# Patient Record
Sex: Female | Born: 1945
Health system: Southern US, Community
[De-identification: ages and names within clinical notes are randomized; demographics above are authoritative.]

## PROBLEM LIST (undated history)

## (undated) DIAGNOSIS — F32A Depression, unspecified: Secondary | ICD-10-CM

## (undated) DIAGNOSIS — M5030 Other cervical disc degeneration, unspecified cervical region: Secondary | ICD-10-CM

## (undated) DIAGNOSIS — F419 Anxiety disorder, unspecified: Secondary | ICD-10-CM

## (undated) DIAGNOSIS — K922 Gastrointestinal hemorrhage, unspecified: Secondary | ICD-10-CM

## (undated) DIAGNOSIS — Z789 Other specified health status: Secondary | ICD-10-CM

## (undated) DIAGNOSIS — I350 Nonrheumatic aortic (valve) stenosis: Secondary | ICD-10-CM

## (undated) DIAGNOSIS — K219 Gastro-esophageal reflux disease without esophagitis: Secondary | ICD-10-CM

## (undated) DIAGNOSIS — E669 Obesity, unspecified: Secondary | ICD-10-CM

## (undated) DIAGNOSIS — F329 Major depressive disorder, single episode, unspecified: Secondary | ICD-10-CM

## (undated) DIAGNOSIS — M797 Fibromyalgia: Secondary | ICD-10-CM

## (undated) DIAGNOSIS — I10 Essential (primary) hypertension: Secondary | ICD-10-CM

## (undated) DIAGNOSIS — I2510 Atherosclerotic heart disease of native coronary artery without angina pectoris: Secondary | ICD-10-CM

## (undated) DIAGNOSIS — E785 Hyperlipidemia, unspecified: Secondary | ICD-10-CM

## (undated) DIAGNOSIS — J329 Chronic sinusitis, unspecified: Secondary | ICD-10-CM

## (undated) DIAGNOSIS — D649 Anemia, unspecified: Secondary | ICD-10-CM

## (undated) DIAGNOSIS — I219 Acute myocardial infarction, unspecified: Secondary | ICD-10-CM

## (undated) DIAGNOSIS — Z780 Asymptomatic menopausal state: Secondary | ICD-10-CM

## (undated) DIAGNOSIS — M1990 Unspecified osteoarthritis, unspecified site: Secondary | ICD-10-CM

## (undated) DIAGNOSIS — M8580 Other specified disorders of bone density and structure, unspecified site: Secondary | ICD-10-CM

## (undated) DIAGNOSIS — M419 Scoliosis, unspecified: Secondary | ICD-10-CM

## (undated) HISTORY — DX: Other specified disorders of bone density and structure, unspecified site: M85.80

## (undated) HISTORY — DX: Other specified health status: Z78.9

## (undated) HISTORY — DX: Unspecified osteoarthritis, unspecified site: M19.90

## (undated) HISTORY — DX: Other cervical disc degeneration, unspecified cervical region: M50.30

## (undated) HISTORY — DX: Asymptomatic menopausal state: Z78.0

## (undated) HISTORY — DX: Chronic sinusitis, unspecified: J32.9

## (undated) HISTORY — DX: Hyperlipidemia, unspecified: E78.5

## (undated) HISTORY — PX: COLONOSCOPY: SHX174

## (undated) HISTORY — PX: CATARACT EXTRACTION: SUR2

## (undated) HISTORY — PX: KNEE ARTHROSCOPY: SUR90

## (undated) HISTORY — DX: Nonrheumatic aortic (valve) stenosis: I35.0

## (undated) HISTORY — PX: CARDIAC CATHETERIZATION: SHX172

## (undated) HISTORY — DX: Scoliosis, unspecified: M41.9

---

## 1971-12-19 HISTORY — PX: TOTAL VAGINAL HYSTERECTOMY: SHX2548

## 1991-12-19 HISTORY — PX: LAPAROSCOPIC CHOLECYSTECTOMY: SUR755

## 2001-07-16 ENCOUNTER — Encounter: Payer: Self-pay | Admitting: Internal Medicine

## 2001-07-16 ENCOUNTER — Ambulatory Visit (HOSPITAL_COMMUNITY): Admission: RE | Admit: 2001-07-16 | Discharge: 2001-07-16 | Payer: Self-pay | Admitting: Internal Medicine

## 2002-05-27 ENCOUNTER — Other Ambulatory Visit: Admission: RE | Admit: 2002-05-27 | Discharge: 2002-05-27 | Payer: Self-pay | Admitting: Obstetrics and Gynecology

## 2002-08-07 ENCOUNTER — Encounter: Payer: Self-pay | Admitting: Emergency Medicine

## 2002-08-07 ENCOUNTER — Emergency Department (HOSPITAL_COMMUNITY): Admission: EM | Admit: 2002-08-07 | Discharge: 2002-08-07 | Payer: Self-pay | Admitting: Emergency Medicine

## 2002-08-14 ENCOUNTER — Ambulatory Visit (HOSPITAL_COMMUNITY): Admission: RE | Admit: 2002-08-14 | Discharge: 2002-08-14 | Payer: Self-pay | Admitting: Cardiology

## 2002-08-14 ENCOUNTER — Encounter: Payer: Self-pay | Admitting: Cardiology

## 2002-11-25 ENCOUNTER — Encounter: Admission: RE | Admit: 2002-11-25 | Discharge: 2003-02-23 | Payer: Self-pay | Admitting: Internal Medicine

## 2003-03-24 ENCOUNTER — Encounter: Admission: RE | Admit: 2003-03-24 | Discharge: 2003-06-22 | Payer: Self-pay | Admitting: Internal Medicine

## 2003-05-31 ENCOUNTER — Encounter: Payer: Self-pay | Admitting: Internal Medicine

## 2003-05-31 ENCOUNTER — Ambulatory Visit (HOSPITAL_COMMUNITY): Admission: RE | Admit: 2003-05-31 | Discharge: 2003-05-31 | Payer: Self-pay | Admitting: Internal Medicine

## 2004-01-19 HISTORY — PX: GASTRIC BYPASS: SHX52

## 2004-11-16 ENCOUNTER — Inpatient Hospital Stay (HOSPITAL_COMMUNITY): Admission: EM | Admit: 2004-11-16 | Discharge: 2004-11-19 | Payer: Self-pay | Admitting: Emergency Medicine

## 2004-11-17 DIAGNOSIS — I219 Acute myocardial infarction, unspecified: Secondary | ICD-10-CM

## 2004-11-17 HISTORY — DX: Acute myocardial infarction, unspecified: I21.9

## 2005-10-03 ENCOUNTER — Inpatient Hospital Stay (HOSPITAL_COMMUNITY): Admission: EM | Admit: 2005-10-03 | Discharge: 2005-10-05 | Payer: Self-pay | Admitting: Emergency Medicine

## 2007-05-10 ENCOUNTER — Encounter: Admission: RE | Admit: 2007-05-10 | Discharge: 2007-05-10 | Payer: Self-pay | Admitting: Internal Medicine

## 2008-01-23 ENCOUNTER — Emergency Department (HOSPITAL_COMMUNITY): Admission: EM | Admit: 2008-01-23 | Discharge: 2008-01-23 | Payer: Self-pay | Admitting: Emergency Medicine

## 2008-09-15 ENCOUNTER — Encounter (HOSPITAL_COMMUNITY): Admission: RE | Admit: 2008-09-15 | Discharge: 2008-12-14 | Payer: Self-pay | Admitting: Internal Medicine

## 2009-02-23 ENCOUNTER — Ambulatory Visit (HOSPITAL_COMMUNITY): Admission: RE | Admit: 2009-02-23 | Discharge: 2009-02-23 | Payer: Self-pay | Admitting: Internal Medicine

## 2010-04-17 LAB — HM DEXA SCAN

## 2011-05-05 NOTE — Discharge Summary (Signed)
[name], Stone              ACCOUNT NO.:  [id number]   MEDICAL RECORD NO.:  [id number]          PATIENT TYPE:  INP   LOCATION:  3029                         FACILITY:  [facility]   PHYSICIAN:  Mark A. Perini, M.D.   DATE OF BIRTH:  1946/05/08   DATE OF ADMISSION:  10/03/2005  DATE OF DISCHARGE:  10/05/2005                                 DISCHARGE SUMMARY   DISCHARGE DIAGNOSES:  1.  Acute upper gastrointestinal bleeding due to an anastomotic ulcer at the      site of the previous gastrojejunal junction, status post gastric bypass      surgery.  2.  Anemia of acute blood loss, status post transfusion of 2 units packed      red cells this admission.  3.  Atherosclerotic coronary disease, stable at this time.  4.  Hypertension stable at this time.   PROCEDURE:  Esophagogastroduodenoscopy which showed a tubular stomach status  post apparent banded gastroplasty with a two limbed gastrojejunostomy.  There was a nonbleeding 1.5 cm ulcer at the limbs at the anastomosis.  This  procedure was performed on October 03, 2005.   DISCHARGE MEDICATIONS:  She is to stop Celebrex and use no NSAIDs ever  again. She is to stop Plavix until Dr. Luther Parody clears her to do so and she  is told that she can resume this in approximately one week. She is to  continue all other previous medications. She is to take Protonix 40 mg twice  daily with food. She is to continue to use Tylenol as needed. She may resume  her previous Effexor XR 150 mg daily, Wellbutrin XL 150 mg daily, Zetia 10  mg daily, Lipitor 40 mg daily all as before. She is to hold her Vasotec for  one week before resuming this.   HISTORY OF PRESENT ILLNESS:  Kimberly Stone is a pleasant 65 year old female who  underwent mini-gastric bypass surgery in February of 2005 for obesity. She  has responded very well to this. However, she developed at 6:00 p.m. the day  prior to admission, some stomach upset and went to bed for few hours and  when she woke  up at 11:00 p.m. she vomited some nonbloody material. She then  had a syncopal type episode and had maroon, very dark, bloody bowel  movement. She was back to bed and this morning early in the morning had  another dark bloody, maroon stool. She called our office and was advised to  present to the emergency room.  In the emergency room, she was somewhat  tachycardiac and hypotensive and her hemoglobin was 9.9, which is a drop  from a baseline of 13.7 which was just obtained in September of 2006. She  was admitted for acute upper GI bleed.   HOSPITAL COURSE:  Kimberly Stone was admitted to a transitional care level bed. She  was given 2 units of packed red cells and responded to this well. She did  not become more hypotensive. GI was consulted and performed an EGD  procedure. She was found a 1.5 cm ulcer at her anastomosis. She was treated  with  IV Protonix and she had no evidence of recurrent bleeding. On October 05, 2005, she was deemed stable for discharge home.   DISCHARGE PHYSICAL EXAM:  Temperature 97.4, the patient was afebrile, pulse  69, respiratory 20, blood pressure 115/60, 97% saturation on room air. She  is in no acute distress. Lungs were clear to auscultation bilaterally with  no wheezes, rales or rhonchi. Heart was regular rate and rhythm with no  murmur, rub or gallop. Abdomen was soft and nontender. There was no edema.  She is neurologically intact.   LABORATORY DATA ON DISCHARGE:  White count 5.9, hemoglobin 10.9, platelet  count 178,000.  Other laboratory data on admission:  Coagulation studies  were normal.  Her albumin was somewhat low at 3.2 but other liver function  tests were normal on admission.   DISCHARGE INSTRUCTIONS:  Kimberly Stone is to follow a low salt diet and is to avoid  foods that can cause reflux or gastric irritation such as spicy or overly  greasy foods. She is to follow-up in two weeks with Dr. Waynard Edwards.  She is to  follow-up with Dr. Luther Parody as well. She is to  call if she has any  recurrent problems.           ______________________________  Redge Gainer Waynard Edwards, M.D.     MAP/MEDQ  D:  10/10/2005  T:  10/10/2005  Job:  [id number]   cc:   Althea Grimmer. Luther Parody, M.D.  Fax: [phone]   Thereasa Solo. Little, M.D.  Fax: [phone]

## 2011-05-05 NOTE — Cardiovascular Report (Signed)
Kimberly Stone, Kimberly Stone              ACCOUNT NO.:  [id number]   MEDICAL RECORD NO.:  [id number]          PATIENT TYPE:  INP   LOCATION:  1826                         FACILITY:  [facility]   PHYSICIAN:  Thereasa Solo. Little, M.D. DATE OF BIRTH:  [birth year]   DATE OF PROCEDURE:  11/16/2004  DATE OF DISCHARGE:                              CARDIAC CATHETERIZATION   INDICATIONS:  This 65 year old female presented to the emergency room with  about a one and a half hour duration of severe left arm pain with some  radiation down the left arm.  No associated diaphoresis or shortness of  breath.  It resolved in the emergency room without significant treatment.  She is completely pain-free.  Her EKG is normal; however, her troponin is  increased at 0.36.  Because of this, she is brought to the catheter lab.  She has been given IV heparin in the emergency room and IV nitroglycerin.   DESCRIPTION OF PROCEDURE:  After obtaining informed consent, the patient was  prepped and draped in the usual sterile fashion exposing the right groin.  Applying local anesthetic with 1%, the Seldinger technique was employed and  a 5 [place] introducer sheath was placed into the right femoral artery.  Left  and right coronary arteriography and ventriculography in the RAO projection  and LAO projection was performed.   Intracoronary nitroglycerin to the left system was also given.   RESULTS:  1.  Left main normal.  2.  LAD:  The LAD extended down towards the apex and bifurcated into what      looked almost like a twin LAD system with a very large diagonal branch      and a very large ongoing LAD.  This entire system was free of disease.  3.  Circumflex:  The circumflex gave rise to a large OM vessel that was free      of disease.  4.  Optional diagonal:  The optional diagonal was a small vessel about 1.5      mm in diameter.  It extended down the lateral wall.  It was diffusely      diseased throughout.  In the proximal  area, it was about 70% narrowed      with diffuse 70-80% areas of narrowing throughout.  5.  Right coronary artery:  This was a very large vessel about 4.5 mm in      diameter with a large PDA and posterolateral branch, all of which were      free of disease.   VENTRICULOGRAPHY:  Ventriculography in both the RAO and LAO projection  revealed an inferior lateral segmental wall motion abnormality of severe  hypokinesis. The ejection fraction was still 50% and the end-diastolic  pressure was 9.   HEMODYNAMIC DATA:  1.  Central aortic pressure was 152/91.  2.  Left ventricular pressure was 156/5 and there was no aortic valve      gradient noted at the time of pullback.   CONCLUSION:  Severely diseased small optional diagonal vessel responsible  for wall motion abnormalities in the inferior lateral wall.  The vessel is  diameter wise to small and diffusely diseased for consideration of  intervention.  I  plan to place her on metoprolol 25 mg q.d., Plavix, Imdur, and aspirin.  She  should do well from a medical standpoint.  I would keep her in the hospital  approximately 48 hours since this is clearly a small myocardial infarction.  Of note, she had TIMI II flow precatheterization and postcatheterization.       ABL/MEDQ  D:  11/16/2004  T:  11/16/2004  Job:  [id number]   cc:   Loraine Leriche A. Waynard Edwards, M.D.  [address]  [place]  [place] [address]  Fax: [phone]

## 2011-05-05 NOTE — Consult Note (Signed)
NAMEDORETTA, REMMERT              ACCOUNT NO.:  [id number]   MEDICAL RECORD NO.:  [id number]          PATIENT TYPE:  EMS   LOCATION:  MAJO                         FACILITY:  [facility]   PHYSICIAN:  Althea Grimmer. Santogade, M.D.DATE OF BIRTH:  Jan 22, 1946   DATE OF CONSULTATION:  10/03/2005  DATE OF DISCHARGE:                                   CONSULTATION   Ms. Sposito is a 65 year old female who presents to the emergency room after  having maroon stool last night and this morning accompanied by syncope last  p.m. She has no prior history of gastrointestinal bleeding or ulcer disease.  She is never had an upper endoscopy. However, she had a colonoscopy in March  2004 that showed melanosis but no other problems. She is status post a mini  gastric bypass in February 2005 for obesity and pre-diabetes. She has not  had any major problems from this procedure until this date. She has a  history of subendocardial MI in November 2005. Initially she was treated  with aspirin and Plavix but currently is only on Plavix. She does admit to  occasional Celebrex but no other nonsteroidals. She denies abdominal pain or  chest discomfort. In the emergency room, her hemoglobin was 9.9 with a  baseline of 13.7, platelet count, and prothrombin time are normal. Her BUN  is 34 with a creatinine of 0.8.   PAST MEDICAL HISTORY:  1.  Fibromyalgia.  2.  Prior obesity (she has lost 90 pounds since her bypass).  3.  Hypertension.  4.  Hyperlipidemia.  5.  Constipation, resolved after bypass surgery.  6.  Coronary artery disease.  7.  Degenerative joint disease.  8.  Metabolic syndrome.  9.  Status post subendocardial MI.   CURRENT MEDICATIONS:  1.  Plavix 75 mg daily.  2.  Effexor XR 100 mg daily.  3.  Multivitamins with iron or.  4.  Ursodiol 300 mg twice daily.  5.  Zetia 10 mg daily.  6.  Wellbutrin XL 150 mg daily.  7.  Lipitor 40 mg daily.  8.  Vasotec 10 mg daily.   FAMILY HISTORY:  Is notable liver  cancer.   SOCIAL HISTORY:  Married, nonsmoker, minimal alcohol.   REVIEW OF SYSTEMS:  As noted in above problems.   PHYSICAL EXAMINATION:  GENERAL: She is a well-developed, well-nourished  adult female in no acute distress.  VITAL SIGNS:  Afebrile. Blood pressure 107/74, pulse 85.  SKIN: Normal.  HEENT: Eyes anicteric, conjunctiva mildly pale. Oropharynx unremarkable.  NECK:  Supple without thyromegaly or adenopathy.  CHEST:  Sounds clear.  HEART:  Sounds regular rate and rhythm.  ABDOMEN:  Benign without mass, tenderness or organomegaly.  RECTAL:  Exam is not repeated, but showed maroon stool according to the  emergency room physician.  EXTREMITIES:  Without cyanosis, clubbing, edema or rash.   IMPRESSION:  Acute upper gastrointestinal bleed, likely an anastomotic  ulcer.   PLAN:  1.  Hydrate.  2.  Transfuse as needed.  3.  Protonix IV.  4.  Upper endoscopy is suggested to the patient and reviewed in terms of  technique, preparation and risk of complications including bleeding and      perforation. It will be performed within the next 1-2 hours with further      recommendations to follow that procedure. It should be noted that the      distal stomach is inaccessible to endoscopic studies if we do not find      an answer on the initial upper endoscopy.      Althea Grimmer. Luther Parody, M.D.  Electronically Signed     PJS/MEDQ  D:  10/03/2005  T:  10/03/2005  Job:  [id number]   cc:   Loraine Leriche A. Perini, M.D.  Fax: [phone]   Llana Aliment. Malon Kindle., M.D.  Fax: [phone]   Thereasa Solo. Little, M.D.  Fax: [phone]

## 2011-05-05 NOTE — Discharge Summary (Signed)
NAMEJOEL, Kimberly Stone              ACCOUNT NO.:  [id number]   MEDICAL RECORD NO.:  [id number]          PATIENT TYPE:  INP   LOCATION:                               FACILITY:  [facility]   PHYSICIAN:  Thereasa Solo. Little, M.D. DATE OF BIRTH:  [birth year]   DATE OF ADMISSION:  11/16/2004  DATE OF DISCHARGE:  11/19/2004                                 DISCHARGE SUMMARY   DISCHARGE DIAGNOSES:  1.  Subendocardial myocardial infarction this admission secondary to an      optional diagonal lesion, plan is for medical therapy.  2.  Good left      ventricular function with an ejection fraction of 50%.  3.      Hyperlipidemia with an low-density lipoprotein of 120.  4. History of      depression.  5. History of gastric bypass surgery in 01/2003 with a      subsequent 80 pound weight loss.   HOSPITAL COURSE:  The patient is a 65 year old female seen by Dr. Clarene Duke in  the past who had a Cardiolite study a couple of years ago that was negative.  She was in her kitchen when she had a sudden onset of left chest pain.  She  presented to the emergency room.  Her symptoms were associated with some  nausea.  She was seen by Dr. Clarene Duke.  Initial enzymes were positive.  She  was taken to the cath lab by Dr. Clarene Duke.  Catheterization revealed patent  LAD, patent circumflex, diffusely diseased OM and a patent RCA.  An LV  function was normal with an EF of 50% although she did have some inferior  and lateral wall hypokinesis.  Dr. Clarene Duke felt her lesion was too small for  percutaneous intervention and she has been treated medically.  She did well.  Her CKs peaked at 374 with 65 MBs.  She was transferred to the floor and  ambulated.  We feel she can be discharged 11/19/04.   DISCHARGE MEDICATIONS:  1.  Coated aspirin once a day.  2. Prilosec over-the-counter once a day.  3.      Plavix 75 mg a day.  4. Toprol-XL 25 mg a day.  5. Zocor 40 mg a day.      6. Aldactone 12.5 mg a day.  7. Imdur 30 mg a day.  8.  Nitroglycerin      sublingual p.r.n.  9. Effexor as taken at home.  10. Wellbutrin as taken      at home.   LABORATORY:  Sodium 137, potassium 3.9, BUN 10, creatinine 0.8.  White count  6.7, hemoglobin 12.4, hematocrit 36.2, platelets 242,000.  INR 1.0.  Liver  functions are normal.  AST is 27.  ALT 19.  Lipid profile shows a total  cholesterol of 197, HDL 50, LDL 120, triglycerides 133.  EKG reveals sinus  rhythm with no acute changes.  Portable chest x-ray revealed no active  disease.   DISPOSITION:  The patient is discharged in stable condition and will follow  up with Dr. Clarene Duke.       ________________________________________  Franky Macho  Leonor Liv, P.A.  ___________________________________________  Thereasa Solo Little, M.D.    Lenard Lance  D:  11/19/2004  T:  11/20/2004  Job:  [id number]

## 2011-05-05 NOTE — H&P (Signed)
Kimberly Stone, Kimberly Stone              ACCOUNT NO.:  [id number]   MEDICAL RECORD NO.:  [id number]          PATIENT TYPE:  EMS   LOCATION:  MAJO                         FACILITY:  [facility]   PHYSICIAN:  Mark A. Perini, M.D.   DATE OF BIRTH:  February 15, 1946   DATE OF ADMISSION:  10/03/2005  DATE OF DISCHARGE:                                HISTORY & PHYSICAL   CHIEF COMPLAINT:  Presyncope/syncope, and maroon stool.   HISTORY OF PRESENT ILLNESS:  Kimberly Stone is a pleasant 65 year old female with  past medical history as listed below.  She was in her usual state of health  until approximately 6 p.m. yesterday, when she developed some stomach upset.  She went to bed and slept for a few hours.  She woke up at 11 p.m. and did  vomit somewhat.  There was no blood in her vomit and very little emesis was  produced.  She then had a presyncopal or close to syncopal episode and had a  maroon, very dark, bloody bowel movement.  She went back to bed and this  morning had another episode of very dark, bloody stool.  She called our  office and was advised to come to the emergency room.  In the emergency room  she is felt to have somewhat elevated pulse, close to 100 beats per minute.  Her blood pressure is somewhat low for her at 100/70 and her hemoglobin is  down to 9.9.  Her baseline hemoglobin is 13.7 from September 2006.  She will  be admitted for an acute upper GI bleed.   PAST MEDICAL HISTORY:  1.  Fibromyalgia.  2.  Obesity.  3.  Hypertension since 1980.  4.  Hyperlipidemia.  5.  Depression.  6.  Status post cholecystectomy in 1980.  7.  Vaginal hysterectomy without oophorectomy.  8.  Cervical radiculopathy in September 2001.  9.  Gastroesophageal reflux disease.  10. DJD and DDD of the cervical spine.  11. Dysmetabolic syndrome X.  12. Mini-gastric bypass surgery in February 2005.  13. Atherosclerotic coronary disease, status post an MI in December 2005.      This was a relatively small vessel, and  her other coronaries were clear.      She was managed medically for this.  14. Allergic rhinitis.   ALLERGIES:  BETA BLOCKERS cause dyspnea, MAXZIDE caused decreased potassium,  and ZOCOR caused LFTs to go up and some myalgias.   MEDICATIONS IN THE EMERGENCY ROOM:  Normal saline 500 mL bolus.   HOME MEDICATIONS:  1.  Plavix 75 mg daily.  2.  Effexor XR 150 mg daily.  3.  Multivitamin with iron daily.  4.  Ursodiol 300 mg one cup twice daily.  5.  Zetia 10 mg daily.  6.  Wellbutrin XL 150 mg daily.  7.  Lipitor 40 mg daily.  8.  Vasotec 10 mg daily.  9.  Celebrex 200 mg occasionally for arthritis.   She uses no other NSAIDs.  She has not been using any Prilosec lately.   SOCIAL HISTORY:  She has been married since 1968.  Her husband's name  is  Kimberly Stone.  He is at her bedside.  She had three children but one child was  murdered.  She has two grandchildren that she is raising.  No tobacco, no  drug use, no significant alcohol use history.   FAMILY HISTORY:  Father died at age 21 of liver cancer.  Mother living at  age 14 with dementia.  She has one living brother with hypertension.  She  had a colonoscopy in March 2004, which was negative.   REVIEW OF SYSTEMS:  As per the history of present illness.  Specifically,  the patient denies any fevers.  She denies any chest pains or shortness of  breath or other constitutional symptoms.   PHYSICAL EXAMINATION:  VITAL SIGNS:  Temperature 98.7, blood pressure  107/74, pulse 85-99, respiratory rate 20, 98% saturation on room air.  GENERAL:  She is in no acute distress.  She is pale.  No icterus.  NECK:  No JVD.  CHEST:  Lungs are clear to auscultation bilaterally with no wheezes, rales  or rhonchi.  CARDIAC:  Heart is regular rate and rhythm with no murmur, rub, or gallop.  ABDOMEN:  Soft, nontender, with normoactive bowel sounds.  EXTREMITIES:  There is no edema.  NEUROLOGIC:  She is alert and oriented x4 and neurologically intact.   VASCULAR:  She has 2+ distal pulses.   LABORATORY DATA:  White count 9.7 with a normal differential, platelet count  238,000, MCV of 91, hemoglobin is 9.9.  INR 1.1.  Sodium 134, potassium 3.6,  chloride 103, CO2 24, BUN 34, creatinine 0.8, glucose 102, total protein  5.5, albumin 3.2, AST 22, ALT 21, alkaline phosphatase 103.  She has O  positive blood.  Total bilirubin 0.7.   ASSESSMENT AND PLAN:  1.  Acute upper gastrointestinal bleed.  Will admit, hydrate with normal      saline.  We will transfuse two units of packed red cells and will type      and cross for a total of 4 units of packed red cells.  Will obtain a GI      consultation.  2.  Atherosclerotic coronary disease.  Will follow this.  3.  Hypertension.  Will hold her Vasotec currently.  4.  Depression.  Continue her depression medicines.  5.  The patient is a full code status.           ______________________________  Redge Gainer Waynard Edwards, M.D.     MAP/MEDQ  D:  10/03/2005  T:  10/03/2005  Job:  [id number]   cc:   Althea Grimmer. Luther Parody, M.D.  Fax: [phone]   Thereasa Solo. Little, M.D.  Fax: [phone]

## 2011-12-22 DIAGNOSIS — I2510 Atherosclerotic heart disease of native coronary artery without angina pectoris: Secondary | ICD-10-CM | POA: Diagnosis not present

## 2011-12-22 DIAGNOSIS — E782 Mixed hyperlipidemia: Secondary | ICD-10-CM | POA: Diagnosis not present

## 2011-12-22 DIAGNOSIS — I10 Essential (primary) hypertension: Secondary | ICD-10-CM | POA: Diagnosis not present

## 2012-02-29 DIAGNOSIS — Z124 Encounter for screening for malignant neoplasm of cervix: Secondary | ICD-10-CM | POA: Diagnosis not present

## 2012-02-29 DIAGNOSIS — Z01419 Encounter for gynecological examination (general) (routine) without abnormal findings: Secondary | ICD-10-CM | POA: Diagnosis not present

## 2012-03-04 DIAGNOSIS — M19049 Primary osteoarthritis, unspecified hand: Secondary | ICD-10-CM | POA: Diagnosis not present

## 2012-05-01 DIAGNOSIS — M949 Disorder of cartilage, unspecified: Secondary | ICD-10-CM | POA: Diagnosis not present

## 2012-05-01 DIAGNOSIS — D509 Iron deficiency anemia, unspecified: Secondary | ICD-10-CM | POA: Diagnosis not present

## 2012-05-01 DIAGNOSIS — E785 Hyperlipidemia, unspecified: Secondary | ICD-10-CM | POA: Diagnosis not present

## 2012-05-01 DIAGNOSIS — I2510 Atherosclerotic heart disease of native coronary artery without angina pectoris: Secondary | ICD-10-CM | POA: Diagnosis not present

## 2012-05-01 DIAGNOSIS — I10 Essential (primary) hypertension: Secondary | ICD-10-CM | POA: Diagnosis not present

## 2012-05-01 DIAGNOSIS — M899 Disorder of bone, unspecified: Secondary | ICD-10-CM | POA: Diagnosis not present

## 2012-05-08 DIAGNOSIS — F329 Major depressive disorder, single episode, unspecified: Secondary | ICD-10-CM | POA: Diagnosis not present

## 2012-05-08 DIAGNOSIS — F3289 Other specified depressive episodes: Secondary | ICD-10-CM | POA: Diagnosis not present

## 2012-05-08 DIAGNOSIS — E785 Hyperlipidemia, unspecified: Secondary | ICD-10-CM | POA: Diagnosis not present

## 2012-05-08 DIAGNOSIS — Z0000 Encounter for general adult medical examination without abnormal findings: Secondary | ICD-10-CM | POA: Diagnosis not present

## 2012-05-08 DIAGNOSIS — I10 Essential (primary) hypertension: Secondary | ICD-10-CM | POA: Diagnosis not present

## 2012-05-08 DIAGNOSIS — Z1212 Encounter for screening for malignant neoplasm of rectum: Secondary | ICD-10-CM | POA: Diagnosis not present

## 2012-06-04 DIAGNOSIS — M899 Disorder of bone, unspecified: Secondary | ICD-10-CM | POA: Diagnosis not present

## 2012-07-15 DIAGNOSIS — M9981 Other biomechanical lesions of cervical region: Secondary | ICD-10-CM | POA: Diagnosis not present

## 2012-07-15 DIAGNOSIS — S139XXA Sprain of joints and ligaments of unspecified parts of neck, initial encounter: Secondary | ICD-10-CM | POA: Diagnosis not present

## 2012-07-15 DIAGNOSIS — M999 Biomechanical lesion, unspecified: Secondary | ICD-10-CM | POA: Diagnosis not present

## 2012-08-04 ENCOUNTER — Emergency Department (HOSPITAL_COMMUNITY)
Admission: EM | Admit: 2012-08-04 | Discharge: 2012-08-04 | Disposition: A | Discharge status: Home / Self Care | Payer: Medicare Other | Attending: Emergency Medicine | Admitting: Emergency Medicine

## 2012-08-04 ENCOUNTER — Encounter (HOSPITAL_COMMUNITY): Payer: Self-pay | Admitting: *Deleted

## 2012-08-04 ENCOUNTER — Emergency Department (HOSPITAL_COMMUNITY): Payer: Medicare Other

## 2012-08-04 DIAGNOSIS — S86919A Strain of unspecified muscle(s) and tendon(s) at lower leg level, unspecified leg, initial encounter: Secondary | ICD-10-CM

## 2012-08-04 DIAGNOSIS — I252 Old myocardial infarction: Secondary | ICD-10-CM | POA: Insufficient documentation

## 2012-08-04 DIAGNOSIS — I2510 Atherosclerotic heart disease of native coronary artery without angina pectoris: Secondary | ICD-10-CM | POA: Diagnosis not present

## 2012-08-04 DIAGNOSIS — IMO0002 Reserved for concepts with insufficient information to code with codable children: Secondary | ICD-10-CM | POA: Diagnosis not present

## 2012-08-04 DIAGNOSIS — Z87891 Personal history of nicotine dependence: Secondary | ICD-10-CM | POA: Insufficient documentation

## 2012-08-04 DIAGNOSIS — Z043 Encounter for examination and observation following other accident: Secondary | ICD-10-CM | POA: Diagnosis not present

## 2012-08-04 DIAGNOSIS — S99929A Unspecified injury of unspecified foot, initial encounter: Secondary | ICD-10-CM | POA: Insufficient documentation

## 2012-08-04 DIAGNOSIS — S99919A Unspecified injury of unspecified ankle, initial encounter: Secondary | ICD-10-CM | POA: Diagnosis not present

## 2012-08-04 DIAGNOSIS — R079 Chest pain, unspecified: Secondary | ICD-10-CM | POA: Insufficient documentation

## 2012-08-04 DIAGNOSIS — S8990XA Unspecified injury of unspecified lower leg, initial encounter: Secondary | ICD-10-CM | POA: Insufficient documentation

## 2012-08-04 DIAGNOSIS — M25469 Effusion, unspecified knee: Secondary | ICD-10-CM | POA: Diagnosis not present

## 2012-08-04 DIAGNOSIS — Z90710 Acquired absence of both cervix and uterus: Secondary | ICD-10-CM | POA: Diagnosis not present

## 2012-08-04 DIAGNOSIS — I10 Essential (primary) hypertension: Secondary | ICD-10-CM | POA: Diagnosis not present

## 2012-08-04 DIAGNOSIS — Z9884 Bariatric surgery status: Secondary | ICD-10-CM | POA: Diagnosis not present

## 2012-08-04 DIAGNOSIS — W108XXA Fall (on) (from) other stairs and steps, initial encounter: Secondary | ICD-10-CM | POA: Insufficient documentation

## 2012-08-04 HISTORY — DX: Atherosclerotic heart disease of native coronary artery without angina pectoris: I25.10

## 2012-08-04 HISTORY — DX: Essential (primary) hypertension: I10

## 2012-08-04 HISTORY — DX: Acute myocardial infarction, unspecified: I21.9

## 2012-08-04 MED ORDER — ACETAMINOPHEN 500 MG PO TABS
1000.0000 mg | ORAL_TABLET | Freq: Once | ORAL | Status: AC
  Administered 2012-08-04: 1000 mg via ORAL
  Filled 2012-08-04: qty 2

## 2012-08-04 MED ORDER — IBUPROFEN 800 MG PO TABS
800.0000 mg | ORAL_TABLET | Freq: Once | ORAL | Status: AC
  Administered 2012-08-04: 800 mg via ORAL
  Filled 2012-08-04: qty 1

## 2012-08-04 MED ORDER — HYDROCODONE-ACETAMINOPHEN 5-325 MG PO TABS: 1.0000 | ORAL_TABLET | Freq: Four times a day (QID) | ORAL | Status: AC | PRN

## 2012-08-04 NOTE — ED Provider Notes (Signed)
History     CSN: [id number]  Arrival date & time 08/04/12  1440   First MD Initiated Contact with Patient 08/04/12 1524      Chief Complaint  Patient presents with  . Fall    (Consider location/radiation/quality/duration/timing/severity/associated sxs/prior treatment) Patient is a 66 y.o. female presenting with fall. The history is provided by the patient.  Fall The accident occurred 1 to 2 hours ago. Incident: walking down steps. She landed on a hard floor. The point of impact was the right knee. The pain is present in the right knee. The pain is moderate. She was ambulatory at the scene. There was no entrapment after the fall. There was no drug use involved in the accident. There was no alcohol use involved in the accident. Pertinent negatives include no abdominal pain, no bowel incontinence, no hematuria and no loss of consciousness. The symptoms are aggravated by standing. She has tried nothing for the symptoms.    Past Medical History  Diagnosis Date  . Hypertension   . Coronary artery disease   . Acute MI     Past Surgical History  Procedure Date  . Gastric bypass   . Abdominal hysterectomy     History reviewed. No pertinent family history.  History  Substance Use Topics  . Smoking status: Former Games developer  . Smokeless tobacco: Not on file  . Alcohol Use: Yes    OB History    Grav Para Term Preterm Abortions TAB SAB Ect Mult Living                  Review of Systems  Constitutional: Negative for activity change.       All ROS Neg except as noted in HPI  HENT: Negative for nosebleeds and neck pain.   Eyes: Negative for photophobia and discharge.  Respiratory: Negative for cough, shortness of breath and wheezing.   Cardiovascular: Positive for chest pain. Negative for palpitations.  Gastrointestinal: Negative for abdominal pain, blood in stool and bowel incontinence.  Genitourinary: Negative for dysuria, frequency and hematuria.  Musculoskeletal: Negative for  back pain and arthralgias.  Skin: Negative.   Neurological: Negative for dizziness, seizures, loss of consciousness and speech difficulty.  Psychiatric/Behavioral: Negative for hallucinations and confusion.    Allergies  Review of patient's allergies indicates no known allergies.  Home Medications  No current outpatient prescriptions on file.  BP 106/61  Pulse 64  Temp 97.8 F (36.6 C) (Oral)  Resp 16  Ht 5\' 2"  (1.575 m)  Wt 132 lb (59.875 kg)  BMI 24.14 kg/m2  SpO2 98%  Physical Exam  Nursing note and vitals reviewed. Constitutional: She is oriented to person, place, and time. She appears well-developed and well-nourished.  Non-toxic appearance.  HENT:  Head: Normocephalic.  Right Ear: Tympanic membrane and external ear normal.  Left Ear: Tympanic membrane and external ear normal.  Eyes: EOM and lids are normal. Pupils are equal, round, and reactive to light.  Neck: Normal range of motion. Neck supple. Carotid bruit is not present.  Cardiovascular: Normal rate, regular rhythm, normal heart sounds, intact distal pulses and normal pulses.   Pulmonary/Chest: Breath sounds normal. No respiratory distress.  Abdominal: Soft. Bowel sounds are normal. There is no tenderness. There is no guarding.  Musculoskeletal: Normal range of motion.       There is a mild effusion of the right knee present. The knee is warm but not hot. The distal pulses are symmetrical. The Achilles tendon on the right is intact.  The right hip has full range of motion. There is some discomfort to palpation of the posterior knee, but no mass is appreciated.  Lymphadenopathy:       Head (right side): No submandibular adenopathy present.       Head (left side): No submandibular adenopathy present.    She has no cervical adenopathy.  Neurological: She is alert and oriented to person, place, and time. She has normal strength. No cranial nerve deficit or sensory deficit.  Skin: Skin is warm and dry.  Psychiatric:  She has a normal mood and affect. Her speech is normal.    ED Course  Procedures (including critical care time)  Labs Reviewed - No data to display Dg Knee Complete 4 Views Right  08/04/2012  *RADIOLOGY REPORT*  Clinical Data: Knee pain and popping after a fall.  RIGHT KNEE - COMPLETE 4+ VIEW  Comparison: 06/27/2011.  Findings: There is a small joint effusion.  No definite fracture. Trace patellofemoral osteophytosis.  IMPRESSION: Small joint effusion.  No fracture.  Original Report Authenticated By: Reyes Ivan, M.D.     No diagnosis found.    MDM  I have reviewed nursing notes, vital signs, and all appropriate lab and imaging results for this patient. The x-ray of the knee is negative for fracture there is a small joint effusion present. Examination raises question of a strain of the knee. The patient is placed in a knee immobilizer. Patient is given an ice pack. Patient will use Tylenol or ibuprofen for soreness, and a prescription for Norco was given for more severe pain.       Kathie Dike, [place] 08/04/12 1625

## 2012-08-04 NOTE — ED Notes (Addendum)
Pt c/o falling down 3 steps approximately 1 hour ago. Pt c/o pain in her right knee and shin. Also c/o upper back pain.

## 2012-08-05 NOTE — ED Provider Notes (Signed)
Medical screening examination/treatment/procedure(s) were performed by non-physician practitioner and as supervising physician I was immediately available for consultation/collaboration.   Carleene Cooper III, MD 08/05/12 [phone]

## 2012-08-10 DIAGNOSIS — M2390 Unspecified internal derangement of unspecified knee: Secondary | ICD-10-CM | POA: Diagnosis not present

## 2012-08-15 DIAGNOSIS — M238X9 Other internal derangements of unspecified knee: Secondary | ICD-10-CM | POA: Diagnosis not present

## 2012-08-20 DIAGNOSIS — M2390 Unspecified internal derangement of unspecified knee: Secondary | ICD-10-CM | POA: Diagnosis not present

## 2012-08-20 DIAGNOSIS — M19049 Primary osteoarthritis, unspecified hand: Secondary | ICD-10-CM | POA: Diagnosis not present

## 2012-08-22 DIAGNOSIS — Z01810 Encounter for preprocedural cardiovascular examination: Secondary | ICD-10-CM | POA: Diagnosis not present

## 2012-08-22 DIAGNOSIS — E782 Mixed hyperlipidemia: Secondary | ICD-10-CM | POA: Diagnosis not present

## 2012-08-22 DIAGNOSIS — I2510 Atherosclerotic heart disease of native coronary artery without angina pectoris: Secondary | ICD-10-CM | POA: Diagnosis not present

## 2012-08-28 NOTE — H&P (Signed)
  Kimberly Stone DOB: [birth year]  Chief Compliant: right knee pain  History of Present Illness The patient is a 66 year old female who is scheduled for a right knee arthroscopy with medial menisectomy by Dr. Darrelyn Hillock on Tuesday September 03, 2012. presented with knee complaints. The patient reports right knee symptoms including: pain and swelling which began 6 day ago following a specific injury. Patient slipped on the steps at home. She had immediate pain in her right knee. She went to [facility] and they performed xrays. She said that she still has swelling and pain.  MRI revealed medial meniscus tear in the right knee.   Past Medical History Depression Gastroesophageal Reflux Disease Hypertension Myocardial infarction   Allergies No Known Drug Allergies.    Medication History Plavix ( Oral)  Wellbutrin ( Oral)  Lexapro ( Oral)  Protonix ( Oral)  Vasotec ( Oral)  Livalo ( Oral)  Calcium 1000 + D ( Oral)  Fish Oil  Multi Complete ( Oral)   Review of Systems General:Present- Fatigue. Not Present- Chills, Fever, Night Sweats, Appetite Loss, Feeling sick, Weight Gain and Weight Loss. Gastrointestinal:Present- Heartburn. Not Present- Bloody Stool, Abdominal Pain, Vomiting, Nausea and Incontinence of Stool. Musculoskeletal:Present- Muscle Pain, Joint Stiffness, Joint Pain and Back Pain. Not Present- Muscle Weakness and Joint Swelling. Neurological:Present- Tingling. Not Present- Numbness, Burning, Tremor, Headaches and Dizziness. Psychiatric:Present- Depression. Not Present- Anxiety and Memory Loss. All other systems negative   Vitals Weight: 132 lb Height: 62 in Body Surface Area: 1.62 m Body Mass Index: 24.14 kg/m BP: 121/79 (Sitting, Left Arm, Standard)   Physical Exam Left knee is normal except for minimal swelling over the prepatellar bursa.She has a right knee effusion that is rather moderate to severe. Her popliteal space  is fine. Collateral ligaments and cruciate's are intact. She has pain with motion. Calf is soft and nontender. No phlebitis. She has good pulses. Heart RRR. S1, S2 normal. No murmurs. Lungs clear to auscultation. Oral cavity negative. Neck supple, no nodes. Abdomen soft and nontender.    RADIOGRAPHS: The right knee x-rays reviewed did not show any major arthritic changes or any fracture.    Assessment & Plan Right knee medial meniscus tear Right knee arthroscopy with medial menisectomy     Ryo Klang,PA-C

## 2012-08-29 ENCOUNTER — Encounter (HOSPITAL_COMMUNITY): Payer: Self-pay | Admitting: Pharmacy Technician

## 2012-09-02 ENCOUNTER — Encounter (HOSPITAL_COMMUNITY): Payer: Self-pay

## 2012-09-02 ENCOUNTER — Ambulatory Visit (HOSPITAL_COMMUNITY)
Admission: RE | Admit: 2012-09-02 | Discharge: 2012-09-02 | Disposition: A | Discharge status: Home / Self Care | Payer: Medicare Other | Source: Ambulatory Visit | Attending: Orthopedic Surgery | Admitting: Orthopedic Surgery

## 2012-09-02 ENCOUNTER — Encounter (HOSPITAL_COMMUNITY)
Admission: RE | Admit: 2012-09-02 | Discharge: 2012-09-02 | Disposition: A | Discharge status: Home / Self Care | Payer: Medicare Other | Source: Ambulatory Visit | Attending: Orthopedic Surgery | Admitting: Orthopedic Surgery

## 2012-09-02 DIAGNOSIS — S83289A Other tear of lateral meniscus, current injury, unspecified knee, initial encounter: Secondary | ICD-10-CM | POA: Diagnosis not present

## 2012-09-02 DIAGNOSIS — Z01812 Encounter for preprocedural laboratory examination: Secondary | ICD-10-CM | POA: Diagnosis not present

## 2012-09-02 DIAGNOSIS — S83509A Sprain of unspecified cruciate ligament of unspecified knee, initial encounter: Secondary | ICD-10-CM | POA: Diagnosis not present

## 2012-09-02 DIAGNOSIS — M1710 Unilateral primary osteoarthritis, unspecified knee: Secondary | ICD-10-CM | POA: Diagnosis not present

## 2012-09-02 DIAGNOSIS — Z79899 Other long term (current) drug therapy: Secondary | ICD-10-CM | POA: Diagnosis not present

## 2012-09-02 DIAGNOSIS — I10 Essential (primary) hypertension: Secondary | ICD-10-CM | POA: Diagnosis not present

## 2012-09-02 DIAGNOSIS — IMO0002 Reserved for concepts with insufficient information to code with codable children: Secondary | ICD-10-CM | POA: Diagnosis not present

## 2012-09-02 DIAGNOSIS — Z01818 Encounter for other preprocedural examination: Secondary | ICD-10-CM | POA: Diagnosis not present

## 2012-09-02 DIAGNOSIS — K219 Gastro-esophageal reflux disease without esophagitis: Secondary | ICD-10-CM | POA: Diagnosis not present

## 2012-09-02 HISTORY — DX: Depression, unspecified: F32.A

## 2012-09-02 HISTORY — DX: Gastro-esophageal reflux disease without esophagitis: K21.9

## 2012-09-02 HISTORY — DX: Unspecified osteoarthritis, unspecified site: M19.90

## 2012-09-02 HISTORY — DX: Anemia, unspecified: D64.9

## 2012-09-02 HISTORY — DX: Major depressive disorder, single episode, unspecified: F32.9

## 2012-09-02 LAB — BASIC METABOLIC PANEL
BUN: 18 mg/dL (ref 6–23)
CO2: 26 mEq/L (ref 19–32)
Calcium: 8.6 mg/dL (ref 8.4–10.5)
Chloride: 104 mEq/L (ref 96–112)
Creatinine, Ser: 0.82 mg/dL (ref 0.50–1.10)
GFR calc Af Amer: 85 mL/min — ABNORMAL LOW (ref >90)
GFR calc non Af Amer: 73 mL/min — ABNORMAL LOW (ref >90)
Glucose, Bld: 93 mg/dL (ref 70–99)
Potassium: 4.3 mEq/L (ref 3.5–5.1)
Sodium: 138 mEq/L (ref 135–145)

## 2012-09-02 LAB — SURGICAL PCR SCREEN
MRSA, PCR: NEGATIVE
Staphylococcus aureus: NEGATIVE

## 2012-09-02 LAB — CBC
HCT: 36.3 % (ref 36.0–46.0)
Hemoglobin: 11.8 g/dL — ABNORMAL LOW (ref 12.0–15.0)
MCH: 30.3 pg (ref 26.0–34.0)
MCHC: 32.5 g/dL (ref 30.0–36.0)
MCV: 93.3 fL (ref 78.0–100.0)
Platelets: 243 10*3/uL (ref 150–400)
RBC: 3.89 MIL/uL (ref 3.87–5.11)
RDW: 12.8 % (ref 11.5–15.5)
WBC: 6.4 10*3/uL (ref 4.0–10.5)

## 2012-09-02 NOTE — Progress Notes (Signed)
EKG 08/22/12 on chart  Last office visit note with Dr Gwenlyn Saran on chart 08/22/12  Clearance note on chart from Dr Gwenlyn Saran

## 2012-09-02 NOTE — Patient Instructions (Signed)
20 Kimberly Stone  09/02/2012   Your procedure is scheduled on:    Report to [facility] at  AM.  Call this number if you have problems the morning of surgery: [phone]   Remember:   Do not eat food:After Midnight.  May have clear liquids:until Midnight .  Clear liquids include soda, tea, black coffee, apple or grape juice, broth.  Take these medicines the morning of surgery with A SIP OF WATER:    Do not wear jewelry, make-up or nail polish.  Do not wear lotions, powders, or perfumes. You may wear deodorant.  Do not shave 48 hours prior to surgery. .  Do not bring valuables to the hospital.  Contacts, dentures or bridgework may not be worn into surgery.  Leave suitcase in the car. After surgery it may be brought to your room.  For patients admitted to the hospital, checkout time is 11:00 AM the day of discharge.       Special Instructions: CHG Shower Use Special Wash: 1/2 bottle night before surgery and 1/2 bottle morning of surgery. shower chin to toes with CHg.  Wash face and private parts with regular soap.    Please read over the following fact sheets that you were given: MRSA Information, coughing and deep breathing exercises, leg exercises

## 2012-09-03 ENCOUNTER — Ambulatory Visit (HOSPITAL_COMMUNITY)
Admission: RE | Admit: 2012-09-03 | Discharge: 2012-09-03 | Disposition: A | Discharge status: Home / Self Care | Payer: Medicare Other | Source: Ambulatory Visit | Attending: Orthopedic Surgery | Admitting: Orthopedic Surgery

## 2012-09-03 ENCOUNTER — Encounter (HOSPITAL_COMMUNITY): Payer: Self-pay | Admitting: Anesthesiology

## 2012-09-03 ENCOUNTER — Encounter (HOSPITAL_COMMUNITY)
Admission: RE | Disposition: A | Discharge status: Home / Self Care | Payer: Self-pay | Source: Ambulatory Visit | Attending: Orthopedic Surgery

## 2012-09-03 ENCOUNTER — Encounter (HOSPITAL_COMMUNITY): Payer: Self-pay | Admitting: *Deleted

## 2012-09-03 ENCOUNTER — Ambulatory Visit (HOSPITAL_COMMUNITY): Payer: Medicare Other | Admitting: Anesthesiology

## 2012-09-03 DIAGNOSIS — K219 Gastro-esophageal reflux disease without esophagitis: Secondary | ICD-10-CM | POA: Diagnosis not present

## 2012-09-03 DIAGNOSIS — I10 Essential (primary) hypertension: Secondary | ICD-10-CM | POA: Insufficient documentation

## 2012-09-03 DIAGNOSIS — IMO0002 Reserved for concepts with insufficient information to code with codable children: Secondary | ICD-10-CM | POA: Insufficient documentation

## 2012-09-03 DIAGNOSIS — S83289A Other tear of lateral meniscus, current injury, unspecified knee, initial encounter: Secondary | ICD-10-CM | POA: Insufficient documentation

## 2012-09-03 DIAGNOSIS — I2510 Atherosclerotic heart disease of native coronary artery without angina pectoris: Secondary | ICD-10-CM | POA: Diagnosis not present

## 2012-09-03 DIAGNOSIS — Z01812 Encounter for preprocedural laboratory examination: Secondary | ICD-10-CM | POA: Insufficient documentation

## 2012-09-03 DIAGNOSIS — M1711 Unilateral primary osteoarthritis, right knee: Secondary | ICD-10-CM | POA: Diagnosis present

## 2012-09-03 DIAGNOSIS — M23205 Derangement of unspecified medial meniscus due to old tear or injury, unspecified knee: Secondary | ICD-10-CM | POA: Diagnosis present

## 2012-09-03 DIAGNOSIS — X58XXXA Exposure to other specified factors, initial encounter: Secondary | ICD-10-CM | POA: Insufficient documentation

## 2012-09-03 DIAGNOSIS — M23349 Other meniscus derangements, anterior horn of lateral meniscus, unspecified knee: Secondary | ICD-10-CM | POA: Diagnosis present

## 2012-09-03 DIAGNOSIS — M1710 Unilateral primary osteoarthritis, unspecified knee: Secondary | ICD-10-CM | POA: Diagnosis not present

## 2012-09-03 DIAGNOSIS — M2390 Unspecified internal derangement of unspecified knee: Secondary | ICD-10-CM | POA: Diagnosis not present

## 2012-09-03 DIAGNOSIS — S83509A Sprain of unspecified cruciate ligament of unspecified knee, initial encounter: Secondary | ICD-10-CM | POA: Diagnosis not present

## 2012-09-03 DIAGNOSIS — Z79899 Other long term (current) drug therapy: Secondary | ICD-10-CM | POA: Insufficient documentation

## 2012-09-03 DIAGNOSIS — M23329 Other meniscus derangements, posterior horn of medial meniscus, unspecified knee: Secondary | ICD-10-CM | POA: Diagnosis not present

## 2012-09-03 SURGERY — ARTHROSCOPY, KNEE, WITH MEDIAL MENISCECTOMY
Anesthesia: General | Site: Knee | Laterality: Right | Wound class: Clean

## 2012-09-03 MED ORDER — LIDOCAINE HCL (CARDIAC) 20 MG/ML IV SOLN
INTRAVENOUS | Status: DC | PRN
  Administered 2012-09-03: 30 mg via INTRAVENOUS

## 2012-09-03 MED ORDER — OXYCODONE-ACETAMINOPHEN 10-325 MG PO TABS: 1.0000 | ORAL_TABLET | ORAL | Status: DC | PRN

## 2012-09-03 MED ORDER — LACTATED RINGERS IV SOLN
INTRAVENOUS | Status: DC
  Administered 2012-09-03 (×2): 1000 mL via INTRAVENOUS

## 2012-09-03 MED ORDER — MIDAZOLAM HCL 5 MG/5ML IJ SOLN
INTRAMUSCULAR | Status: DC | PRN
  Administered 2012-09-03: 0.5 mg via INTRAVENOUS

## 2012-09-03 MED ORDER — FENTANYL CITRATE 0.05 MG/ML IJ SOLN: 25.0000 ug | INTRAMUSCULAR | Status: DC | PRN

## 2012-09-03 MED ORDER — BUPIVACAINE-EPINEPHRINE PF 0.25-1:200000 % IJ SOLN
INTRAMUSCULAR | Status: AC
  Filled 2012-09-03: qty 30

## 2012-09-03 MED ORDER — CEFAZOLIN SODIUM-DEXTROSE 2-3 GM-% IV SOLR
2.0000 g | INTRAVENOUS | Status: AC
  Administered 2012-09-03: 2 g via INTRAVENOUS

## 2012-09-03 MED ORDER — PROMETHAZINE HCL 25 MG/ML IJ SOLN
6.2500 mg | Freq: Once | INTRAMUSCULAR | Status: AC
  Administered 2012-09-03: 6.25 mg via INTRAVENOUS
  Filled 2012-09-03: qty 1

## 2012-09-03 MED ORDER — LEVALBUTEROL HCL 1.25 MG/0.5ML IN NEBU: 1.2500 mg | INHALATION_SOLUTION | Freq: Once | RESPIRATORY_TRACT | Status: DC

## 2012-09-03 MED ORDER — ACETAMINOPHEN 10 MG/ML IV SOLN
INTRAVENOUS | Status: DC | PRN
  Administered 2012-09-03: 1000 mg via INTRAVENOUS

## 2012-09-03 MED ORDER — BUPIVACAINE-EPINEPHRINE 0.25% -1:200000 IJ SOLN
INTRAMUSCULAR | Status: DC | PRN
  Administered 2012-09-03: 30 mL

## 2012-09-03 MED ORDER — BACITRACIN ZINC 500 UNIT/GM EX OINT
TOPICAL_OINTMENT | CUTANEOUS | Status: AC
  Filled 2012-09-03: qty 15

## 2012-09-03 MED ORDER — LACTATED RINGERS IR SOLN
Status: DC | PRN
  Administered 2012-09-03: 6000 mL

## 2012-09-03 MED ORDER — LACTATED RINGERS IV SOLN
INTRAVENOUS | Status: DC | PRN
  Administered 2012-09-03: 12:00:00 via INTRAVENOUS

## 2012-09-03 MED ORDER — HYDROMORPHONE HCL PF 1 MG/ML IJ SOLN
INTRAMUSCULAR | Status: DC | PRN
  Administered 2012-09-03: 1.5 mg via INTRAVENOUS
  Administered 2012-09-03: 0.5 mg via INTRAVENOUS

## 2012-09-03 MED ORDER — CHLORHEXIDINE GLUCONATE 4 % EX LIQD: 60.0000 mL | Freq: Once | CUTANEOUS | Status: DC

## 2012-09-03 MED ORDER — PROPOFOL 10 MG/ML IV EMUL
INTRAVENOUS | Status: DC | PRN
  Administered 2012-09-03: 150 mg via INTRAVENOUS

## 2012-09-03 MED ORDER — CEFAZOLIN SODIUM-DEXTROSE 2-3 GM-% IV SOLR
INTRAVENOUS | Status: AC
  Filled 2012-09-03: qty 50

## 2012-09-03 MED ORDER — FENTANYL CITRATE 0.05 MG/ML IJ SOLN
INTRAMUSCULAR | Status: DC | PRN
  Administered 2012-09-03 (×3): 50 ug via INTRAVENOUS
  Administered 2012-09-03 (×2): 25 ug via INTRAVENOUS

## 2012-09-03 MED ORDER — ACETAMINOPHEN 10 MG/ML IV SOLN
INTRAVENOUS | Status: AC
  Filled 2012-09-03: qty 100

## 2012-09-03 MED ORDER — PROMETHAZINE HCL 25 MG/ML IJ SOLN: 6.2500 mg | INTRAMUSCULAR | Status: DC | PRN

## 2012-09-03 MED ORDER — METOCLOPRAMIDE HCL 5 MG/ML IJ SOLN
10.0000 mg | Freq: Once | INTRAMUSCULAR | Status: AC
  Administered 2012-09-03: 10 mg via INTRAVENOUS
  Filled 2012-09-03: qty 2

## 2012-09-03 MED ORDER — ONDANSETRON HCL 4 MG/2ML IJ SOLN
INTRAMUSCULAR | Status: DC | PRN
  Administered 2012-09-03 (×2): 2 mg via INTRAVENOUS

## 2012-09-03 MED ORDER — LEVALBUTEROL HCL 1.25 MG/0.5ML IN NEBU
INHALATION_SOLUTION | RESPIRATORY_TRACT | Status: AC
  Administered 2012-09-03: 1.25 mg
  Filled 2012-09-03: qty 0.5

## 2012-09-03 SURGICAL SUPPLY — 27 items
BANDAGE ELASTIC 4 VELCRO ST LF (GAUZE/BANDAGES/DRESSINGS) ×2 IMPLANT
BLADE GREAT WHITE 4.2 (BLADE) ×2 IMPLANT
BNDG COHESIVE 6X5 TAN STRL LF (GAUZE/BANDAGES/DRESSINGS) ×2 IMPLANT
CLOTH BEACON ORANGE TIMEOUT ST (SAFETY) ×2 IMPLANT
DRAPE LG THREE QUARTER DISP (DRAPES) ×2 IMPLANT
DRSG PAD ABDOMINAL 8X10 ST (GAUZE/BANDAGES/DRESSINGS) ×4 IMPLANT
DURAPREP 26ML APPLICATOR (WOUND CARE) ×2 IMPLANT
GLOVE BIOGEL PI IND STRL 8 (GLOVE) ×1 IMPLANT
GLOVE BIOGEL PI IND STRL 8.5 (GLOVE) ×1 IMPLANT
GLOVE BIOGEL PI INDICATOR 8 (GLOVE) ×1
GLOVE BIOGEL PI INDICATOR 8.5 (GLOVE) ×1
GLOVE ECLIPSE 8.0 STRL XLNG CF (GLOVE) ×2 IMPLANT
GOWN PREVENTION PLUS LG XLONG (DISPOSABLE) ×4 IMPLANT
GOWN PREVENTION PLUS XLARGE (GOWN DISPOSABLE) IMPLANT
GOWN STRL NON-REIN LRG LVL3 (GOWN DISPOSABLE) IMPLANT
GOWN STRL REIN XL XLG (GOWN DISPOSABLE) ×4 IMPLANT
MANIFOLD NEPTUNE II (INSTRUMENTS) ×2 IMPLANT
PACK ARTHROSCOPY WL (CUSTOM PROCEDURE TRAY) ×2 IMPLANT
PACK ICE MAXI GEL EZY WRAP (MISCELLANEOUS) ×2 IMPLANT
PAD MASON LEG HOLDER (PIN) ×2 IMPLANT
SET ARTHROSCOPY TUBING (MISCELLANEOUS) ×1
SET ARTHROSCOPY TUBING LN (MISCELLANEOUS) ×1 IMPLANT
SUT ETHILON 3 0 PS 1 (SUTURE) ×2 IMPLANT
TOWEL OR 17X26 10 PK STRL BLUE (TOWEL DISPOSABLE) ×2 IMPLANT
TUBING CONNECTING 10 (TUBING) ×2 IMPLANT
WAND 90 DEG TURBOVAC W/CORD (SURGICAL WAND) ×2 IMPLANT
WRAP KNEE MAXI GEL POST OP (GAUZE/BANDAGES/DRESSINGS) ×6 IMPLANT

## 2012-09-03 NOTE — Transfer of Care (Signed)
Immediate Anesthesia Transfer of Care Note  Patient: Kimberly Stone  Procedure(s) Performed: Procedure(s) (LRB) with comments: KNEE ARTHROSCOPY WITH MEDIAL MENISECTOMY (Right)  Patient Location: PACU  Anesthesia Type: General  Level of Consciousness: awake and alert   Airway & Oxygen Therapy: Patient Spontanous Breathing and Patient connected to face mask  Post-op Assessment: Report given to PACU RN  Post vital signs: Reviewed and unstable  Complications: rapid heart rate.  SaO2 variable, improving.

## 2012-09-03 NOTE — Progress Notes (Signed)
Pt very nauseated when ever she moves. No vomiting. Called Dr Okey Dupre. Orders received.

## 2012-09-03 NOTE — Interval H&P Note (Signed)
History and Physical Interval Note:  09/03/2012 12:44 PM  Kimberly Stone  has presented today for surgery, with the diagnosis of Right Knee Medial Mensical Tear  The various methods of treatment have been discussed with the patient and family. After consideration of risks, benefits and other options for treatment, the patient has consented to  Procedure(s) (LRB) with comments: KNEE ARTHROSCOPY WITH MEDIAL MENISECTOMY (Right) as a surgical intervention .  The patient's history has been reviewed, patient examined, no change in status, stable for surgery.  I have reviewed the patient's chart and labs.  Questions were answered to the patient's satisfaction.     Traves Majchrzak A

## 2012-09-03 NOTE — Progress Notes (Signed)
Pt arrived to PACU; tachycardic, BP elevated, SAO2 80's, pt  Alert; HOB elevated, cough and deep breathing encouraged, Dr. Okey Dupre notified and in to see pt; lung sounds diminished with fine crackles; xopenex breathing treatment done; pt able to cough more; 1415; SAO2 90's BP 170's /80's

## 2012-09-03 NOTE — Anesthesia Preprocedure Evaluation (Addendum)
Anesthesia Evaluation  Patient identified by MRN, date of birth, ID band Patient awake    Reviewed: Allergy & Precautions, H&P , NPO status , Patient's Chart, lab work & pertinent test results  Airway Mallampati: II TM Distance: <3 FB Neck ROM: Full    Dental No notable dental hx.    Pulmonary neg pulmonary ROS,  breath sounds clear to auscultation  Pulmonary exam normal       Cardiovascular hypertension, Pt. on medications + CAD and + Past MI Rhythm:Regular Rate:Normal     Neuro/Psych negative neurological ROS  negative psych ROS   GI/Hepatic Neg liver ROS, GERD-  Medicated,  Endo/Other  negative endocrine ROS  Renal/GU negative Renal ROS  negative genitourinary   Musculoskeletal negative musculoskeletal ROS (+)   Abdominal   Peds negative pediatric ROS (+)  Hematology negative hematology ROS (+)   Anesthesia Other Findings   Reproductive/Obstetrics negative OB ROS                          Anesthesia Physical Anesthesia Plan  ASA: III  Anesthesia Plan: General   Post-op Pain Management:    Induction: Intravenous  Airway Management Planned: LMA  Additional Equipment:   Intra-op Plan:   Post-operative Plan:   Informed Consent: I have reviewed the patients History and Physical, chart, labs and discussed the procedure including the risks, benefits and alternatives for the proposed anesthesia with the patient or authorized representative who has indicated his/her understanding and acceptance.   Dental advisory given  Plan Discussed with: CRNA and Surgeon  Anesthesia Plan Comments:         Anesthesia Quick Evaluation

## 2012-09-03 NOTE — Anesthesia Postprocedure Evaluation (Signed)
  Anesthesia Post-op Note  Patient: Kimberly Stone  Procedure(s) Performed: Procedure(s) (LRB): KNEE ARTHROSCOPY WITH MEDIAL MENISECTOMY (Right)  Patient Location: PACU  Anesthesia Type: General  Level of Consciousness: awake and alert   Airway and Oxygen Therapy: Patient Spontanous Breathing  Post-op Pain: mild  Post-op Assessment: Post-op Vital signs reviewed, Patient's Cardiovascular Status Stable, Respiratory Function Stable, Patent Airway and No signs of Nausea or vomiting  Post-op Vital Signs: stable  Complications: No apparent anesthesia complications

## 2012-09-03 NOTE — Brief Op Note (Signed)
09/03/2012  2:03 PM  PATIENT:  Kimberly Stone  66 y.o. female  PRE-OPERATIVE DIAGNOSIS:  Right Knee Medial Mensical Tear;Lateral meniscus Tear and Osteoarthritis.  POST-OPERATIVE DIAGNOSIS:  Right Knee Medial Mensical Tear;Lateral Meniscus Tear and Osteoarthritis. PROCEDURE:  Procedure(s) (LRB) with comments: KNEE ARTHROSCOPY WITH MEDIAL MENISECTOMY (Right)and Lateral Meniscectomy abraision Chondroplasty and Microfracture Technique of Medial Femoral Condyle and Synovectomy of Suprpatellar pouch. SURGEON:  Surgeon(s) and Role:    * Jacki Cones, MD - Primary    ASSISTANTS:  OR Tech  ANESTHESIA:   general  EBL:     BLOOD ADMINISTERED:none  DRAINS: none   LOCAL MEDICATIONS USED:  MARCAINE 0.25% with Epinephrine 30cc total.    SPECIMEN:  No Specimen  DISPOSITION OF SPECIMEN:  N/A  COUNTS:  YES  TOURNIQUET:  * No tourniquets in log *  DICTATION: .Other Dictation: Dictation Number [id number]  PLAN OF CARE: Discharge to home after PACU  PATIENT DISPOSITION:  PACU - hemodynamically stable.   Delay start of Pharmacological VTE agent (>24hrs) due to surgical blood loss or risk of bleeding: yes

## 2012-09-04 NOTE — Op Note (Signed)
NAMEKHARTER, SESTAK              ACCOUNT NO.:  [id number]  MEDICAL RECORD NO.:  [id number]  LOCATION:  [facility]                         FACILITY:  [facility]  PHYSICIAN:  Georges Lynch. Mohamedamin Nifong, M.D.DATE OF BIRTH:  March 04, 1946  DATE OF PROCEDURE:  09/03/2012 DATE OF DISCHARGE:  09/03/2012                              OPERATIVE REPORT   SURGEON:  Windy Fast A. Julann Mcgilvray, MD.  ASSISTANT:  The OR nurse.  PREOPERATIVE DIAGNOSES: 1. Degenerative arthritis, right knee. 2. Torn medial meniscus, right knee. 3. Torn lateral meniscus, right knee. 4. 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 5. Osteoarthritis, right knee.  POSTOPERATIVE DIAGNOSES: 1. Degenerative arthritis, right knee. 2. Torn medial meniscus, right knee. 3. Torn lateral meniscus, right knee. 4. 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 5. Osteoarthritis, right knee.  OPERATION: 1. Diagnostic arthroscopy, right knee. 2. Synovectomy suprapatellar pouch, right knee. 3. Partial lateral meniscectomy, right knee. 4. Debridement of complete chronic disrupted anterior cruciate     ligament, right knee. 5. Abrasion chondroplasty, medial and femoral condyle, right knee. 6. Microfracture technique, medial and femoral condyle, right knee. 7. Medial meniscectomy of the posterior horn, right knee.  PROCEDURE:  Under general anesthesia, routine orthopedic prepping and draping of the right lower extremity was carried out.  The right knee was placed in a knee holder.  The patient had 2 g of IV Ancef.  The appropriate time-out was carried out first.  Prior to that, I marked the appropriate right leg in the holding area.  At this time, after sterile prepping and draping, a small punctate incision was made in suprapatellar pouch.  Inflow cannula was inserted.  Knee was distended with saline.  Another small punctate incision was made in the anterolateral joint.  The arthroscope was entered  from lateral approach and a complete diagnostic arthroscopy was carried out.  I went up in the suprapatellar pouch.  She had severe chronic synovitis that goes along with her osteoarthritis of the knee.  I introduced the ArthroCare and did a synovectomy.  I then went down into the medial joint, and abrasion chondroplasty of the medial and femoral condyle followed by a microfracture technique.  I then went back with the shaver suction device and even off the articular surface of the femur.  This was down to bleeding bone.  I then went in and did a medial meniscectomy.  She had a major tear of the posterior horn of the medial meniscus.  I went over the midline and she had a chronic ACL tear.  I debrided the ACL in usual fashion.  I then went over the lateral joint, she had a tear of the anteromedial part of the lateral meniscus and I did a partial lateral meniscectomy.  The remaining part of the joint looked fine.  I thoroughly irrigated out the knee and removed the fluid, closed all 3 punctate incisions with 3-0 nylon suture.  I injected 30 mL of 0.25% Marcaine, epinephrine in the knee joint and a sterile Neosporin dressing was applied.  Tomorrow, she will start back on her Plavix.  She will be on crutches, partial weightbearing  as I discussed.  I will see her back in about 10- 12 days in the office or prior to if there is a problem.          ______________________________ Georges Lynch Darrelyn Hillock, M.D.     RAG/MEDQ  D:  09/03/2012  T:  09/04/2012  Job:  [id number]

## 2012-11-19 DIAGNOSIS — E785 Hyperlipidemia, unspecified: Secondary | ICD-10-CM | POA: Diagnosis not present

## 2012-11-19 DIAGNOSIS — I2510 Atherosclerotic heart disease of native coronary artery without angina pectoris: Secondary | ICD-10-CM | POA: Diagnosis not present

## 2012-11-19 DIAGNOSIS — Z1331 Encounter for screening for depression: Secondary | ICD-10-CM | POA: Diagnosis not present

## 2012-11-19 DIAGNOSIS — I10 Essential (primary) hypertension: Secondary | ICD-10-CM | POA: Diagnosis not present

## 2012-11-27 DIAGNOSIS — Z1231 Encounter for screening mammogram for malignant neoplasm of breast: Secondary | ICD-10-CM | POA: Diagnosis not present

## 2012-12-31 DIAGNOSIS — D485 Neoplasm of uncertain behavior of skin: Secondary | ICD-10-CM | POA: Diagnosis not present

## 2012-12-31 DIAGNOSIS — L988 Other specified disorders of the skin and subcutaneous tissue: Secondary | ICD-10-CM | POA: Diagnosis not present

## 2012-12-31 DIAGNOSIS — L408 Other psoriasis: Secondary | ICD-10-CM | POA: Diagnosis not present

## 2012-12-31 DIAGNOSIS — L439 Lichen planus, unspecified: Secondary | ICD-10-CM | POA: Diagnosis not present

## 2013-04-18 ENCOUNTER — Other Ambulatory Visit: Payer: Self-pay | Admitting: Obstetrics and Gynecology

## 2013-04-18 ENCOUNTER — Telehealth: Payer: Self-pay | Admitting: Nurse Practitioner

## 2013-04-18 ENCOUNTER — Ambulatory Visit: Payer: Self-pay | Admitting: Nurse Practitioner

## 2013-04-18 DIAGNOSIS — N952 Postmenopausal atrophic vaginitis: Secondary | ICD-10-CM

## 2013-04-18 MED ORDER — ESTRADIOL 0.1 MG/GM VA CREA: TOPICAL_CREAM | VAGINAL | Status: DC

## 2013-04-18 NOTE — Telephone Encounter (Signed)
Hi Belinda,  I need to see the patient's paper chart so I can tell what she is taking.    [employer]

## 2013-04-18 NOTE — Telephone Encounter (Signed)
        Patients appointment with PG cancelled today due to PG out sick. Patient needs refill on Estrace called into [facility] in [place] please.

## 2013-04-18 NOTE — Telephone Encounter (Signed)
Prescription Estrace vaginal cream was sent to the patient's pharmacy in Patty Grubb's absence.  I left a message on the patient's home phone that her requested prescription was filled electronically.  [employer]

## 2013-04-18 NOTE — Telephone Encounter (Signed)
Chart on your desk for review.

## 2013-04-18 NOTE — Telephone Encounter (Signed)
Patients appointment with PG cancelled today due to PG out sick. Patient needs refill on Estrace called into [facility] in [place] please.

## 2013-05-08 ENCOUNTER — Encounter: Payer: Self-pay | Admitting: *Deleted

## 2013-05-13 ENCOUNTER — Encounter: Payer: Self-pay | Admitting: Nurse Practitioner

## 2013-05-13 ENCOUNTER — Ambulatory Visit (INDEPENDENT_AMBULATORY_CARE_PROVIDER_SITE_OTHER): Payer: Medicare Other | Admitting: Nurse Practitioner

## 2013-05-13 VITALS — BP 116/60 | HR 72 | Ht 62.0 in | Wt 138.2 lb

## 2013-05-13 DIAGNOSIS — Z01419 Encounter for gynecological examination (general) (routine) without abnormal findings: Secondary | ICD-10-CM | POA: Diagnosis not present

## 2013-05-13 DIAGNOSIS — N952 Postmenopausal atrophic vaginitis: Secondary | ICD-10-CM | POA: Diagnosis not present

## 2013-05-13 DIAGNOSIS — E559 Vitamin D deficiency, unspecified: Secondary | ICD-10-CM | POA: Diagnosis not present

## 2013-05-13 DIAGNOSIS — Z124 Encounter for screening for malignant neoplasm of cervix: Secondary | ICD-10-CM

## 2013-05-13 NOTE — Patient Instructions (Addendum)

## 2013-05-13 NOTE — Progress Notes (Signed)
67 y.o. G2P2 Married Caucasian Fe here for annual exam.  No new diagnosis or health issues. Lucila Maine now age 32 has been doing some acting out behaviors.  He is now in school at the  [employer].  No LMP recorded. Patient has had a hysterectomy.          Sexually active: no  The current method of family planning is status post hysterectomy.    Exercising: no  The patient does not participate in regular exercise at present. Smoker:  no  Health Maintenance: Pap:  05/27/2002  Normal  MMG:  01/2013 Colonoscopy:  2 years ago per patient - no report in our chart BMD:   09/2012 done at PCP - reported as normal TDaP:  PCP manages tetanus.   Labs: PCP does lab (blood) work and checks urine. Vit D done at PCP    reports that she has quit smoking. She has never used smokeless tobacco. She reports that  drinks alcohol. She reports that she does not use illicit drugs.  Past Medical History  Diagnosis Date  . Hypertension   . Coronary artery disease   . Depression   . GERD (gastroesophageal reflux disease)   . Neuromuscular disorder     hx of fibromyalgia- not seeing md   . Arthritis   . Anemia   . Acute MI     NONSTEMI 2005 NOINTERVENTION PER NOTE   . Post-menopausal   . Scoliosis     Past Surgical History  Procedure Laterality Date  . Gastric bypass    . Abdominal hysterectomy    . Cardiac catheterization      2005  . Colonoscopy    . Cholecystectomy      Current Outpatient Prescriptions  Medication Sig Dispense Refill  . amLODipine (NORVASC) 5 MG tablet Take 5 mg by mouth daily with breakfast.      . buPROPion (WELLBUTRIN XL) 150 MG 24 hr tablet Take 450 mg by mouth daily with breakfast.      . calcium-vitamin D (OSCAL WITH D) 500-200 MG-UNIT per tablet Take 1 tablet by mouth daily.      . clopidogrel (PLAVIX) 75 MG tablet Take 75 mg by mouth daily with breakfast.      . enalapril (VASOTEC) 5 MG tablet Take 5 mg by mouth daily with breakfast.      . escitalopram (LEXAPRO)  20 MG tablet Take 20 mg by mouth daily with breakfast.      . estradiol (ESTRACE) 0.1 MG/GM vaginal cream Use 1 g vaginally two or three times per week as needed to maintain symptom relief.  42.5 g  0  . ferrous fumarate-iron polysaccharide complex (TANDEM) 162-115.2 MG CAPS Take 1 capsule by mouth daily with breakfast.      . LORazepam (ATIVAN) 1 MG tablet Take 1 mg by mouth as needed.      . Multiple Vitamin (MULTIVITAMIN WITH MINERALS) TABS Take 2 tablets by mouth daily.      . pantoprazole (PROTONIX) 40 MG tablet Take 40 mg by mouth daily with breakfast.      . Pitavastatin Calcium (LIVALO) 2 MG TABS Take 1 tablet by mouth 3 (three) times a week.      . Vitamin D, Ergocalciferol, (DRISDOL) 50000 UNITS CAPS Take 50,000 Units by mouth 3 (three) times a week.      Marland Kitchen oxyCODONE-acetaminophen (PERCOCET) 10-325 MG per tablet Take 1 tablet by mouth every 4 (four) hours as needed for pain.  30 tablet  0  No current facility-administered medications for this visit.    History reviewed. No pertinent family history.  ROS:  Pertinent items are noted in HPI.  Otherwise, a comprehensive ROS was negative.  Exam:   BP 116/60  Pulse 72  Ht 5\' 2"  (1.575 m)  Wt 138 lb 3.2 oz (62.687 kg)  BMI 25.27 kg/m2 Height: 5\' 2"  (157.5 cm)  Ht Readings from Last 3 Encounters:  05/13/13 5\' 2"  (1.575 m)  09/02/12 5\' 2"  (1.575 m)  08/04/12 5\' 2"  (1.575 m)    General appearance: alert, cooperative and appears stated age Head: Normocephalic, without obvious abnormality, atraumatic Neck: no adenopathy, supple, symmetrical, trachea midline and thyroid normal to inspection and palpation Lungs: clear to auscultation bilaterally Breasts: normal appearance, no masses or tenderness Heart: regular rate and rhythm Abdomen: soft, non-tender; no masses,  no organomegaly Extremities: extremities normal, atraumatic, no cyanosis or edema Skin: Skin color, texture, turgor normal. No rashes or lesions Lymph nodes: Cervical,  supraclavicular, and axillary nodes normal. No abnormal inguinal nodes palpated Neurologic: Grossly normal   Pelvic: External genitalia:  no lesions no flare of LSA              Urethra:  normal appearing urethra with no masses, tenderness or lesions              Bartholin's and Skene's: normal                 Vagina: atrophic appearing vagina with pale color and no discharge, no lesions              Cervix: absent              Pap taken: no Bimanual Exam:  Uterus:  uterus absent              Adnexa: no mass, fullness, tenderness               Rectovaginal: Confirms               Anus:  normal sphincter tone, no lesions  A:  Well Woman with normal exam  S/P [facility] 1973 for DUB  Atrophic vaginitis using Estrace vaginal cream prn  History of LSA  Situational depression med's per PCP  History of Vit D deficiency - managed by PCP  P:   Pap smear as per guidelines   Mammogram due 2 /2015  When refill of Estrogen vaginal cream to call back.    She is counseled on potential risk and benefits of estrogen including DVT, CVA, cancer, etc.  counseled on osteoporosis, adequate intake of calcium and vitamin D,   diet and exercise, Kegel's exercises return annually or prn  An After Visit Summary was printed and given to the patient.

## 2013-05-15 NOTE — Progress Notes (Signed)
Encounter reviewed by Dr. Nyilah Kight Silva.  

## 2013-06-18 DIAGNOSIS — H903 Sensorineural hearing loss, bilateral: Secondary | ICD-10-CM | POA: Diagnosis not present

## 2013-06-23 ENCOUNTER — Encounter: Payer: Self-pay | Admitting: Neurology

## 2013-06-23 ENCOUNTER — Ambulatory Visit (INDEPENDENT_AMBULATORY_CARE_PROVIDER_SITE_OTHER): Payer: Medicare Other | Admitting: Neurology

## 2013-06-23 VITALS — BP 110/73 | HR 69 | Temp 97.8°F | Ht 62.0 in | Wt 138.0 lb

## 2013-06-23 DIAGNOSIS — R404 Transient alteration of awareness: Secondary | ICD-10-CM

## 2013-06-23 DIAGNOSIS — R0683 Snoring: Secondary | ICD-10-CM

## 2013-06-23 DIAGNOSIS — R0609 Other forms of dyspnea: Secondary | ICD-10-CM

## 2013-06-23 DIAGNOSIS — R400 Somnolence: Secondary | ICD-10-CM

## 2013-06-23 DIAGNOSIS — R0989 Other specified symptoms and signs involving the circulatory and respiratory systems: Secondary | ICD-10-CM | POA: Diagnosis not present

## 2013-06-23 NOTE — Patient Instructions (Addendum)
I think overall you are doing fairly well but I do want to suggest a few things today:  Based on your symptoms and your exam I believe you are at risk for obstructive sleep apnea or OSA, and I think we should proceed with a sleep study to determine whether you do or do not have OSA and how severe it is. If you have more than mild OSA, I want you to consider treatment with CPAP. Please remember, the risks and ramifications of moderate to severe obstructive sleep apnea or OSA are: Cardiovascular disease, including congestive heart failure, stroke, difficult to control hypertension, arrhythmias, and even type 2 diabetes has been linked to untreated OSA. Sleep apnea causes disruption of sleep and sleep deprivation in most cases, which, in turn, can cause recurrent headaches, problems with memory, mood, concentration, focus, and vigilance. Most people with untreated sleep apnea report excessive daytime sleepiness, which can affect their ability to drive. Please do not drive if you feel sleepy.  I will see you back after your sleep study to go over the test results and where to go from there. We will call you after your sleep study and to set up an appointment at the time.   Remember to drink plenty of fluid, eat healthy meals and do not skip any meals. Try to eat protein with a every meal and eat a healthy snack such as fruit or nuts in between meals. Try to keep a regular sleep-wake schedule and try to exercise daily, particularly in the form of walking, 20-30 minutes a day, if you can.   As far as your medications are concerned, I would like to suggest no changes.   As far as diagnostic testing: sleep study.   Please also call us for any test results so we can go over those with you on the phone. Brett Canales is my clinical assistant and will answer any of your questions and relay your messages to me and also relay most of my messages to you.  Our phone number is [phone]. We also have an after hours call  service for urgent matters and there is a physician on-call for urgent questions. For any emergencies you know to call 911 or go to the nearest emergency room.

## 2013-06-23 NOTE — Progress Notes (Signed)
Subjective:    Patient ID: Kimberly Stone is a 67 y.o. female.  HPI  Huston Foley, MD, PhD [facility] [address] [address] [place], [place] [address]  Dear Dr. Nolen Mu,   I saw your patient, Kimberly Stone, upon your kind request in my neurologic clinic today for initial consultation of her snoring and daytime somnolence, in particular concern for obstructive sleep apnea. The patient is unaccompanied today. As you know, Kimberly Stone is a very pleasant 67 year old right-handed woman with an underlying medical history of hypertension, chronic pain, fibromyalgia, heart disease status post MI in 2006, major depression and general anxiety disorder who has suffered from daytime somnolence for over 10 years. It is very difficult for her to awaken in the morning. She takes prolonged naps during the day. Her surgical history includes a gastric bypass surgery in 2003. Her current medications are Wellbutrin XL, Lexapro, lorazepam.   Her typical bedtime is reported to be around 9:30 to 10 PM and usual wake time is around 6:30-7 AM. Sleep onset typically occurs within 45 minutes. She reports feeling fairly well rested upon awakening, but by 10:30-12:00 she becomes overwhelmingly sleepy and has to take a nap, which is usually about 2 h long. She does not dream during a nap. She wakes up on an average 2-3 in the middle of the night and has to go to the bathroom 1 times on a typical night. She denies morning headaches.  She reports excessive daytime somnolence (EDS) and Her Epworth Sleepiness Score (ESS) is 10/24 today. She has not fallen asleep while driving.   She has been known to snore for the past many years. Snoring is reportedly mild to moderate, and no witnessed apneas are reported by her husband. The patient denies a sense of choking or strangling feeling. There is report of nighttime reflux, with rare nighttime cough experienced. The patient has noted mild RLS  symptoms and is not known to kick while asleep or before falling asleep. She is not a very restless sleeper.   She denies cataplexy, sleep paralysis, hypnagogic or hypnopompic hallucinations, or sleep attacks. She does not report any vivid dreams, nightmares, dream enactments, or parasomnias, such as sleep talking or sleep walking. The patient has not had a sleep study or a home sleep test.  She consumes 3 caffeinated beverage per day, usually in the form of coffee, 2 cups of coffee in AM and 1 at dinner.   Her Past Medical History Is Significant For: Past Medical History  Diagnosis Date  . Hypertension   . Coronary artery disease   . Depression   . GERD (gastroesophageal reflux disease)   . Neuromuscular disorder     hx of fibromyalgia- not seeing md   . Arthritis   . Anemia   . Acute MI 11/2004    NONSTEMI 2005 NOINTERVENTION PER NOTE   . Post-menopausal   . Scoliosis     Her Past Surgical History Is Significant For: Past Surgical History  Procedure Laterality Date  . Gastric bypass  01/2004  . Cardiac catheterization      2005  . Colonoscopy    . Laparoscopic cholecystectomy  1993  . Total vaginal hysterectomy  1973    secondary to DUB - Dr. Roberto Scales and Dr. Katrinka Blazing    Her Family History Is Significant For: Family History  Problem Relation Age of Onset  . Heart failure Mother   . Liver cancer Father   . Lung cancer Brother 50  .  Cancer Brother   . Heart failure Maternal Grandmother     Her Social History Is Significant For: History   Social History  . Marital Status: Married    Spouse Name: Mikle Bosworth    Number of Children: 3  . Years of Education: LPN   Occupational History  . Retired    Social History Main Topics  . Smoking status: Former Smoker -- 0.25 packs/day for 5 years    Types: Cigarettes    Quit date: 12/18/1996  . Smokeless tobacco: Never Used  . Alcohol Use: 0.0 oz/week    1-2 Glasses of wine per week  . Drug Use: No  . Sexually Active: No      Comment: hysterectomy-1973   Other Topics Concern  . None   Social History Narrative   Pt lives at home with family.   Caffeine Use: 2 cups daily    Her Allergies Are:  Allergies  Allergen Reactions  . Aspirin     PT CANNOT TAKE DUE TO HX OF GASTRIC BYPASS SURGERY   . Erythromycin   :   Her Current Medications Are:  Outpatient Encounter Prescriptions as of 06/23/2013  Medication Sig Dispense Refill  . amLODipine (NORVASC) 5 MG tablet Take 5 mg by mouth daily with breakfast.      . buPROPion (WELLBUTRIN XL) 150 MG 24 hr tablet Take 450 mg by mouth daily with breakfast.      . calcium-vitamin D (OSCAL WITH D) 500-200 MG-UNIT per tablet Take 1 tablet by mouth daily.      . clopidogrel (PLAVIX) 75 MG tablet Take 75 mg by mouth daily with breakfast.      . enalapril (VASOTEC) 5 MG tablet Take 5 mg by mouth daily with breakfast.      . escitalopram (LEXAPRO) 20 MG tablet Take 20 mg by mouth daily with breakfast.      . estradiol (ESTRACE) 0.1 MG/GM vaginal cream Use 1 g vaginally two or three times per week as needed to maintain symptom relief.  42.5 g  0  . LORazepam (ATIVAN) 1 MG tablet Take 1 mg by mouth as needed.      . Multiple Vitamin (MULTIVITAMIN WITH MINERALS) TABS Take 2 tablets by mouth daily.      . pantoprazole (PROTONIX) 40 MG tablet Take 40 mg by mouth daily with breakfast.      . Pitavastatin Calcium (LIVALO) 2 MG TABS Take 1 tablet by mouth 3 (three) times a week.      . Vitamin D, Ergocalciferol, (DRISDOL) 50000 UNITS CAPS Take 50,000 Units by mouth 3 (three) times a week.      . [DISCONTINUED] ferrous fumarate-iron polysaccharide complex (TANDEM) 162-115.2 MG CAPS Take 1 capsule by mouth daily with breakfast.       No facility-administered encounter medications on file as of 06/23/2013.   Review of Systems  Constitutional: Positive for fatigue.  HENT: Positive for hearing loss.   Respiratory:       Snoring  Musculoskeletal: Positive for myalgias and arthralgias.   Allergic/Immunologic:       Runny nose  Neurological:       Memory loss, sleepiness, snoring, restless legs  Psychiatric/Behavioral:       Depression, anxiety    Objective:  Neurologic Exam  Physical Exam Physical Examination:   Filed Vitals:   06/23/13 0848  BP: 110/73  Pulse: 69  Temp: 97.8 F (36.6 C)    General Examination: The patient is a very pleasant 67 y.o. female  in no acute distress. She appears well-developed and well-nourished and well groomed.   HEENT: Normocephalic, atraumatic, pupils are equal, round and reactive to light and accommodation. Funduscopic exam is normal with sharp disc margins noted. Extraocular tracking is good without limitation to gaze excursion or nystagmus noted. Normal smooth pursuit is noted. Hearing is grossly intact. Tympanic membranes are clear bilaterally. Face is symmetric with normal facial animation and normal facial sensation. Speech is clear with no dysarthria noted. There is no hypophonia. There is no lip, neck/head, jaw or voice tremor. Neck is supple with full range of passive and active motion. There are no carotid bruits on auscultation. Oropharynx exam reveals: mild mouth dryness, adequate dental hygiene and mild airway crowding, due to narrow airway anatomy. Mallampati is class II. Tongue protrudes centrally and palate elevates symmetrically. Tonsils are small. Neck size is 13.75 inches.   Chest: Clear to auscultation without wheezing, rhonchi or crackles noted.  Heart: S1+S2+0, regular and normal without murmurs, rubs or gallops noted.   Abdomen: Soft, non-tender and non-distended with normal bowel sounds appreciated on auscultation.  Extremities: There is no pitting edema in the distal lower extremities bilaterally. Pedal pulses are intact.  Skin: Warm and dry without trophic changes noted. There are no varicose veins.  Musculoskeletal: exam reveals no obvious joint deformities, tenderness or joint swelling or erythema.    Neurologically:  Mental status: The patient is awake, alert and oriented in all 4 spheres. Her memory, attention, language and knowledge are appropriate. There is no aphasia, agnosia, apraxia or anomia. Speech is clear with normal prosody and enunciation. Thought process is linear. Mood is congruent and affect is normal.  Cranial nerves are as described above under HEENT exam. In addition, shoulder shrug is normal with equal shoulder height noted. Motor exam: Normal bulk, strength and tone is noted. There is no drift, tremor or rebound. Romberg is negative. Reflexes are 2+ throughout. Toes are downgoing bilaterally. Fine motor skills are intact with normal finger taps, normal hand movements, normal rapid alternating patting, normal foot taps and normal foot agility.  Cerebellar testing shows no dysmetria or intention tremor on finger to nose testing. Heel to shin is unremarkable bilaterally. There is no truncal or gait ataxia.  Sensory exam is intact to light touch, pinprick, vibration, temperature sense and proprioception in the upper and lower extremities.  Gait, station and balance are unremarkable. No veering to one side is noted. No leaning to one side is noted. Posture is age-appropriate and stance is narrow based. No problems turning are noted. She turns en bloc. Tandem walk is unremarkable. Intact toe and heel stance is noted.               Assessment and Plan:  Assessment and Plan:  In summary, Kimberly Stone is a very pleasant 67 y.o.-year old female with a history of hypertension, heart disease, depression and anxiety who has had daytime sleepiness for the past 10+ years. She also endorses a history of snoring which can be mild to moderate. Given her postmenopausal state and her underlying history of hypertension and heart disease I do think we need to look further into the possibility of obstructive sleep apnea. She is advised of this potential diagnosis, its prognosis and treatment  options in particular, the risks and ramifications of untreated moderate to severe OSA with regards to cardiovascular disease down the Road. She is advised to undergo a sleep study. I described the sleep test procedure to her and also talked to  her about treatment options including the use of CPAP. She is familiar with the sleep test as her husband had a sleep study and she is also familiar with CPAP as her husband uses a CPAP unit. She would be willing to give it a try for treatment if the need arises. To that end I will order a sleep study and see her back afterwards.    Thank you very much for allowing me to participate in the care of this nice patient. If I can be of any further assistance to you please do not hesitate to call me at [phone].  Sincerely,   Huston Foley, MD, PhD

## 2013-06-24 ENCOUNTER — Telehealth: Payer: Self-pay | Admitting: Neurology

## 2013-06-24 DIAGNOSIS — D485 Neoplasm of uncertain behavior of skin: Secondary | ICD-10-CM | POA: Diagnosis not present

## 2013-06-24 DIAGNOSIS — D2330 Other benign neoplasm of skin of unspecified part of face: Secondary | ICD-10-CM | POA: Diagnosis not present

## 2013-06-24 DIAGNOSIS — L570 Actinic keratosis: Secondary | ICD-10-CM | POA: Diagnosis not present

## 2013-06-24 NOTE — Telephone Encounter (Signed)
I forwarded this information to [employer] who handles our scheduling.  She will call this patient to schedule.  Thank you!

## 2013-06-27 DIAGNOSIS — I10 Essential (primary) hypertension: Secondary | ICD-10-CM | POA: Diagnosis not present

## 2013-06-27 DIAGNOSIS — Z79899 Other long term (current) drug therapy: Secondary | ICD-10-CM | POA: Diagnosis not present

## 2013-06-27 DIAGNOSIS — M899 Disorder of bone, unspecified: Secondary | ICD-10-CM | POA: Diagnosis not present

## 2013-06-27 DIAGNOSIS — E785 Hyperlipidemia, unspecified: Secondary | ICD-10-CM | POA: Diagnosis not present

## 2013-06-27 DIAGNOSIS — I2510 Atherosclerotic heart disease of native coronary artery without angina pectoris: Secondary | ICD-10-CM | POA: Diagnosis not present

## 2013-07-08 ENCOUNTER — Ambulatory Visit (INDEPENDENT_AMBULATORY_CARE_PROVIDER_SITE_OTHER): Payer: Medicare Other | Admitting: Neurology

## 2013-07-08 VITALS — BP 157/85 | HR 64 | Ht 62.0 in | Wt 140.0 lb

## 2013-07-08 DIAGNOSIS — G4713 Recurrent hypersomnia: Secondary | ICD-10-CM

## 2013-07-08 DIAGNOSIS — G479 Sleep disorder, unspecified: Secondary | ICD-10-CM

## 2013-07-08 DIAGNOSIS — R400 Somnolence: Secondary | ICD-10-CM

## 2013-07-08 DIAGNOSIS — R0989 Other specified symptoms and signs involving the circulatory and respiratory systems: Secondary | ICD-10-CM

## 2013-07-08 DIAGNOSIS — R0609 Other forms of dyspnea: Secondary | ICD-10-CM

## 2013-07-08 DIAGNOSIS — R0683 Snoring: Secondary | ICD-10-CM

## 2013-07-08 DIAGNOSIS — G4763 Sleep related bruxism: Secondary | ICD-10-CM

## 2013-07-10 DIAGNOSIS — I2510 Atherosclerotic heart disease of native coronary artery without angina pectoris: Secondary | ICD-10-CM | POA: Diagnosis not present

## 2013-07-10 DIAGNOSIS — K219 Gastro-esophageal reflux disease without esophagitis: Secondary | ICD-10-CM | POA: Diagnosis not present

## 2013-07-10 DIAGNOSIS — Z79899 Other long term (current) drug therapy: Secondary | ICD-10-CM | POA: Diagnosis not present

## 2013-07-10 DIAGNOSIS — Z6825 Body mass index (BMI) 25.0-25.9, adult: Secondary | ICD-10-CM | POA: Diagnosis not present

## 2013-07-10 DIAGNOSIS — E785 Hyperlipidemia, unspecified: Secondary | ICD-10-CM | POA: Diagnosis not present

## 2013-07-10 DIAGNOSIS — F329 Major depressive disorder, single episode, unspecified: Secondary | ICD-10-CM | POA: Diagnosis not present

## 2013-07-10 DIAGNOSIS — Z23 Encounter for immunization: Secondary | ICD-10-CM | POA: Diagnosis not present

## 2013-07-10 DIAGNOSIS — Z0000 Encounter for general adult medical examination without abnormal findings: Secondary | ICD-10-CM | POA: Diagnosis not present

## 2013-07-10 DIAGNOSIS — F3289 Other specified depressive episodes: Secondary | ICD-10-CM | POA: Diagnosis not present

## 2013-07-10 DIAGNOSIS — M1990 Unspecified osteoarthritis, unspecified site: Secondary | ICD-10-CM | POA: Diagnosis not present

## 2013-07-15 DIAGNOSIS — Z1212 Encounter for screening for malignant neoplasm of rectum: Secondary | ICD-10-CM | POA: Diagnosis not present

## 2013-07-18 ENCOUNTER — Telehealth: Payer: Self-pay | Admitting: Neurology

## 2013-07-18 NOTE — Telephone Encounter (Signed)
Please call and notify the patient that the recent sleep study did not show any significant obstructive sleep apnea, but she snores mildly and grinds her teeth. Please inform patient that I would like to go over the details of the study during a follow up appointment and if not already previously scheduled, arrange a followup appointment (please utilize a followu-up slot). Also, route or fax report to PCP and referring MD, if other than PCP.  Once you have spoken to patient, you can close this encounter.   Thanks,  Huston Foley, MD, PhD [facility])

## 2013-07-20 NOTE — Telephone Encounter (Signed)
Left message for patient regarding sleep study results, asked patient to call me back to discuss results and have questions answered.  Explained that a copy of the sleep study was sent to referring physician and copy of study is coming to them in the mail.  Pt notified of need to schedule follow up appt with Dr. Frances Furbish, told her she could call Monday and talk to Kissa who could schedule her follow up with Dr. Frances Furbish.  I also explained that I would be in again on Tuesday and we could discuss results by phone if she would like before her appt.

## 2013-07-22 ENCOUNTER — Encounter: Payer: Self-pay | Admitting: *Deleted

## 2013-07-22 NOTE — Telephone Encounter (Signed)
Tried patient again, LM on cell phone.  Mailed report today and reminded pt of need to schedule with Dr. Frances Furbish.  Report will be faxed to Dr. Nolen Mu today. -sh

## 2013-07-28 DIAGNOSIS — M5137 Other intervertebral disc degeneration, lumbosacral region: Secondary | ICD-10-CM | POA: Diagnosis not present

## 2013-07-28 DIAGNOSIS — M999 Biomechanical lesion, unspecified: Secondary | ICD-10-CM | POA: Diagnosis not present

## 2013-07-29 DIAGNOSIS — M5137 Other intervertebral disc degeneration, lumbosacral region: Secondary | ICD-10-CM | POA: Diagnosis not present

## 2013-07-29 DIAGNOSIS — M999 Biomechanical lesion, unspecified: Secondary | ICD-10-CM | POA: Diagnosis not present

## 2013-07-31 ENCOUNTER — Telehealth: Payer: Self-pay | Admitting: Neurology

## 2013-08-04 DIAGNOSIS — M5137 Other intervertebral disc degeneration, lumbosacral region: Secondary | ICD-10-CM | POA: Diagnosis not present

## 2013-08-04 DIAGNOSIS — M999 Biomechanical lesion, unspecified: Secondary | ICD-10-CM | POA: Diagnosis not present

## 2013-08-05 DIAGNOSIS — M5137 Other intervertebral disc degeneration, lumbosacral region: Secondary | ICD-10-CM | POA: Diagnosis not present

## 2013-08-05 DIAGNOSIS — M999 Biomechanical lesion, unspecified: Secondary | ICD-10-CM | POA: Diagnosis not present

## 2013-08-11 DIAGNOSIS — M5137 Other intervertebral disc degeneration, lumbosacral region: Secondary | ICD-10-CM | POA: Diagnosis not present

## 2013-08-11 DIAGNOSIS — M999 Biomechanical lesion, unspecified: Secondary | ICD-10-CM | POA: Diagnosis not present

## 2013-08-13 ENCOUNTER — Encounter: Payer: Self-pay | Admitting: *Deleted

## 2013-08-13 DIAGNOSIS — M999 Biomechanical lesion, unspecified: Secondary | ICD-10-CM | POA: Diagnosis not present

## 2013-08-13 DIAGNOSIS — M5137 Other intervertebral disc degeneration, lumbosacral region: Secondary | ICD-10-CM | POA: Diagnosis not present

## 2013-08-14 DIAGNOSIS — M999 Biomechanical lesion, unspecified: Secondary | ICD-10-CM | POA: Diagnosis not present

## 2013-08-14 DIAGNOSIS — M5137 Other intervertebral disc degeneration, lumbosacral region: Secondary | ICD-10-CM | POA: Diagnosis not present

## 2013-08-15 ENCOUNTER — Encounter: Payer: Self-pay | Admitting: Cardiovascular Disease

## 2013-08-19 ENCOUNTER — Encounter: Payer: Self-pay | Admitting: Cardiovascular Disease

## 2013-08-19 ENCOUNTER — Ambulatory Visit (INDEPENDENT_AMBULATORY_CARE_PROVIDER_SITE_OTHER): Payer: Medicare Other | Admitting: Cardiovascular Disease

## 2013-08-19 VITALS — BP 124/80 | HR 69 | Resp 16 | Ht 62.0 in | Wt 138.7 lb

## 2013-08-19 DIAGNOSIS — E785 Hyperlipidemia, unspecified: Secondary | ICD-10-CM | POA: Diagnosis not present

## 2013-08-19 DIAGNOSIS — I10 Essential (primary) hypertension: Secondary | ICD-10-CM | POA: Insufficient documentation

## 2013-08-19 DIAGNOSIS — I2510 Atherosclerotic heart disease of native coronary artery without angina pectoris: Secondary | ICD-10-CM

## 2013-08-19 DIAGNOSIS — M999 Biomechanical lesion, unspecified: Secondary | ICD-10-CM | POA: Diagnosis not present

## 2013-08-19 DIAGNOSIS — M5137 Other intervertebral disc degeneration, lumbosacral region: Secondary | ICD-10-CM | POA: Diagnosis not present

## 2013-08-19 NOTE — Patient Instructions (Addendum)
Your physician recommends that you schedule a follow-up appointment in: 12 months.  

## 2013-08-19 NOTE — Assessment & Plan Note (Signed)
Excellent control.   

## 2013-08-19 NOTE — Assessment & Plan Note (Signed)
Currently asymptomatic. Small non-STEMI and cardiac catheterization in 2005 related to a diffusely diseased ramus intermedius artery. Preserved left ventricular systolic function without clinical heart failure.

## 2013-08-19 NOTE — Progress Notes (Signed)
Patient ID: Kimberly Stone, female   DOB: 1946-06-26, 67 y.o.   MRN: [id number] .    Reason for office visit CAD follow up  Kimberly Stone returns for yearly followup. It has been a roughly 9 years since she presented with a non-ST segment elevation myocardial infarction which was related to severe disease in a ramus intermedius artery that was felt to be too diffusely diseased to allow percutaneous intervention. She has done well with medical therapy. She remains physically very active, working in her garden on a daily basis.  She saw her primary care physician Kimberly Stone very recently. Lab work was done. Her LDL cholesterol was reported as a little high. Unfortunately we have had a lot of difficulty identifying a statin she can tolerate. She's currently taking Livalo 3 times a week. Zetia caused a liver test abnormalities. She has well compensated systemic hypertension. Knee surgery last year was not complicated.   Allergies  Allergen Reactions  . Aspirin     PT CANNOT TAKE DUE TO HX OF GASTRIC BYPASS SURGERY   . Erythromycin     Current Outpatient Prescriptions  Medication Sig Dispense Refill  . amLODipine (NORVASC) 5 MG tablet Take 5 mg by mouth daily with breakfast.      . buPROPion (WELLBUTRIN XL) 150 MG 24 hr tablet Take 450 mg by mouth daily with breakfast.      . calcium-vitamin D (OSCAL WITH D) 500-200 MG-UNIT per tablet Take 1 tablet by mouth daily.      . clopidogrel (PLAVIX) 75 MG tablet Take 75 mg by mouth daily with breakfast.      . enalapril (VASOTEC) 5 MG tablet Take 5 mg by mouth daily with breakfast.      . estradiol (ESTRACE) 0.1 MG/GM vaginal cream Use 1 g vaginally two or three times per week as needed to maintain symptom relief.  42.5 g  0  . LORazepam (ATIVAN) 1 MG tablet Take 1 mg by mouth as needed.      . Multiple Vitamin (MULTIVITAMIN WITH MINERALS) TABS Take 2 tablets by mouth daily.      . pantoprazole (PROTONIX) 40 MG tablet Take 40 mg by mouth daily  with breakfast.      . Pitavastatin Calcium (LIVALO) 2 MG TABS Take 1 tablet by mouth 3 (three) times a week.      . Vitamin D, Ergocalciferol, (DRISDOL) 50000 UNITS CAPS Take 50,000 Units by mouth 3 (three) times a week.       No current facility-administered medications for this visit.    Past Medical History  Diagnosis Date  . Hypertension   . Coronary artery disease   . Depression   . GERD (gastroesophageal reflux disease)   . Neuromuscular disorder     hx of fibromyalgia- not seeing md   . Arthritis   . Anemia   . Acute MI 11/2004    NONSTEMI 2005 NOINTERVENTION PER NOTE   . Post-menopausal   . Scoliosis   . Hyperlipidemia     Past Surgical History  Procedure Laterality Date  . Gastric bypass  01/2004  . Cardiac catheterization      2005 Severely disease small optional diagonal vessel responsible far wall motion abnormalities wise to small and diffusely diseased for consideration of interventio.  . Colonoscopy    . Laparoscopic cholecystectomy  1993  . Total vaginal hysterectomy  1973    secondary to DUB - Kimberly Stone and Kimberly Stone    Family History  Problem Relation Age of Onset  . Heart failure Mother   . Liver cancer Father   . Lung cancer Brother 21  . Cancer Brother   . Heart failure Maternal Grandmother     History   Social History  . Marital Status: Married    Spouse Name: Kimberly Stone    Number of Children: 3  . Years of Education: LPN   Occupational History  . Retired    Social History Main Topics  . Smoking status: Former Smoker -- 0.25 packs/day for 5 years    Types: Cigarettes    Quit date: 12/18/1996  . Smokeless tobacco: Never Used  . Alcohol Use: 0.0 oz/week    1-2 Glasses of wine per week  . Drug Use: No  . Sexual Activity: No     Comment: hysterectomy-1973   Other Topics Concern  . Not on file   Social History Narrative   Pt lives at home with family.   Caffeine Use: 2 cups daily    Review of systems: She believes she has a  little more joint aches since increasing the dose of statin . The patient specifically denies any chest pain at rest or with exertion, dyspnea at rest or with exertion, orthopnea, paroxysmal nocturnal dyspnea, syncope, palpitations, focal neurological deficits, intermittent claudication, lower extremity edema, unexplained weight gain, cough, hemoptysis or wheezing.  The patient also denies abdominal pain, nausea, vomiting, dysphagia, diarrhea, constipation, polyuria, polydipsia, dysuria, hematuria, frequency, urgency, abnormal bleeding or bruising, fever, chills, unexpected weight changes, mood swings, change in skin or hair texture, change in voice quality, auditory or visual problems, allergic reactions or rashes, new musculoskeletal complaints other than usual "aches and pains".   PHYSICAL EXAM BP 124/80  Pulse 69  Resp 16  Ht 5\' 2"  (1.575 m)  Wt 138 lb 11.2 oz (62.914 kg)  BMI 25.36 kg/m2  General: Alert, oriented x3, no distress Head: no evidence of trauma, PERRL, EOMI, no exophtalmos or lid lag, no myxedema, no xanthelasma; normal ears, nose and oropharynx Neck: normal jugular venous pulsations and no hepatojugular reflux; brisk carotid pulses without delay and no carotid bruits Chest: clear to auscultation, no signs of consolidation by percussion or palpation, normal fremitus, symmetrical and full respiratory excursions Cardiovascular: normal position and quality of the apical impulse, regular rhythm, normal first and second heart sounds, no rubs or gallops, there is a grade 1-2/6 early peaking systolic ejection murmur in the aortic focus Abdomen: no tenderness or distention, no masses by palpation, no abnormal pulsatility or arterial bruits, normal bowel sounds, no hepatosplenomegaly Extremities: no clubbing, cyanosis or edema; 2+ radial, ulnar and brachial pulses bilaterally; 2+ right femoral, posterior tibial and dorsalis pedis pulses; 2+ left femoral, posterior tibial and dorsalis  pedis pulses; no subclavian or femoral bruits Neurological: grossly nonfocal   EKG: Normal sinus rhythm, normal tracing  Lipid Panel  No results found for this basename: chol, trig, hdl, cholhdl, vldl, ldlcalc    BMET    Component Value Date/Time   NA 138 09/02/2012 1445   K 4.3 09/02/2012 1445   CL 104 09/02/2012 1445   CO2 26 09/02/2012 1445   GLUCOSE 93 09/02/2012 1445   BUN 18 09/02/2012 1445   CREATININE 0.82 09/02/2012 1445   CALCIUM 8.6 09/02/2012 1445   GFRNONAA 73* 09/02/2012 1445   GFRAA 85* 09/02/2012 1445     ASSESSMENT AND PLAN CAD (coronary artery disease) Currently asymptomatic. Small non-STEMI and cardiac catheterization in 2005 related to a diffusely diseased ramus intermedius  artery. Preserved left ventricular systolic function without clinical heart failure.  Hyperlipidemia We will get the more recent lab tests, but I agree with Kimberly Stone current approach. Once PSCK9 inhibitors become available, they might be a good option  HTN (hypertension) Excellent control   Orders Placed This Encounter  Procedures  . EKG 12-Lead   No orders of the defined types were placed in this encounter.    Junious Silk, MD, [facility] [phone] office [phone] pager

## 2013-08-19 NOTE — Assessment & Plan Note (Signed)
We will get the more recent lab tests, but I agree with Dr. Laurey Morale current approach. Once PSCK9 inhibitors become available, they might be a good option

## 2013-08-21 DIAGNOSIS — M5137 Other intervertebral disc degeneration, lumbosacral region: Secondary | ICD-10-CM | POA: Diagnosis not present

## 2013-08-21 DIAGNOSIS — M999 Biomechanical lesion, unspecified: Secondary | ICD-10-CM | POA: Diagnosis not present

## 2013-09-01 DIAGNOSIS — M5137 Other intervertebral disc degeneration, lumbosacral region: Secondary | ICD-10-CM | POA: Diagnosis not present

## 2013-09-01 DIAGNOSIS — M999 Biomechanical lesion, unspecified: Secondary | ICD-10-CM | POA: Diagnosis not present

## 2013-09-02 DIAGNOSIS — M5137 Other intervertebral disc degeneration, lumbosacral region: Secondary | ICD-10-CM | POA: Diagnosis not present

## 2013-09-02 DIAGNOSIS — M999 Biomechanical lesion, unspecified: Secondary | ICD-10-CM | POA: Diagnosis not present

## 2013-09-03 DIAGNOSIS — K219 Gastro-esophageal reflux disease without esophagitis: Secondary | ICD-10-CM | POA: Diagnosis not present

## 2013-09-03 DIAGNOSIS — R059 Cough, unspecified: Secondary | ICD-10-CM | POA: Diagnosis not present

## 2013-09-03 DIAGNOSIS — K2100 Gastro-esophageal reflux disease with esophagitis, without bleeding: Secondary | ICD-10-CM | POA: Diagnosis not present

## 2013-09-03 DIAGNOSIS — R05 Cough: Secondary | ICD-10-CM | POA: Diagnosis not present

## 2013-09-03 DIAGNOSIS — Z1211 Encounter for screening for malignant neoplasm of colon: Secondary | ICD-10-CM | POA: Diagnosis not present

## 2013-09-04 DIAGNOSIS — M999 Biomechanical lesion, unspecified: Secondary | ICD-10-CM | POA: Diagnosis not present

## 2013-09-04 DIAGNOSIS — M5137 Other intervertebral disc degeneration, lumbosacral region: Secondary | ICD-10-CM | POA: Diagnosis not present

## 2013-09-10 DIAGNOSIS — Z9884 Bariatric surgery status: Secondary | ICD-10-CM | POA: Diagnosis not present

## 2013-09-10 DIAGNOSIS — K314 Gastric diverticulum: Secondary | ICD-10-CM | POA: Diagnosis not present

## 2013-09-10 DIAGNOSIS — K219 Gastro-esophageal reflux disease without esophagitis: Secondary | ICD-10-CM | POA: Diagnosis not present

## 2013-09-10 DIAGNOSIS — Z1211 Encounter for screening for malignant neoplasm of colon: Secondary | ICD-10-CM | POA: Diagnosis not present

## 2013-09-10 DIAGNOSIS — K449 Diaphragmatic hernia without obstruction or gangrene: Secondary | ICD-10-CM | POA: Diagnosis not present

## 2013-09-17 ENCOUNTER — Ambulatory Visit
Admission: RE | Admit: 2013-09-17 | Discharge: 2013-09-17 | Disposition: A | Discharge status: Home / Self Care | Payer: Medicare Other | Source: Ambulatory Visit | Attending: Gastroenterology | Admitting: Gastroenterology

## 2013-09-17 ENCOUNTER — Other Ambulatory Visit: Payer: Self-pay | Admitting: Gastroenterology

## 2013-09-17 DIAGNOSIS — R059 Cough, unspecified: Secondary | ICD-10-CM | POA: Diagnosis not present

## 2013-09-17 DIAGNOSIS — R05 Cough: Secondary | ICD-10-CM

## 2013-09-17 DIAGNOSIS — R509 Fever, unspecified: Secondary | ICD-10-CM | POA: Diagnosis not present

## 2014-01-03 ENCOUNTER — Encounter (HOSPITAL_COMMUNITY): Payer: Self-pay | Admitting: Emergency Medicine

## 2014-01-03 ENCOUNTER — Emergency Department (HOSPITAL_COMMUNITY): Payer: Medicare Other

## 2014-01-03 ENCOUNTER — Observation Stay (HOSPITAL_COMMUNITY): Payer: Medicare Other

## 2014-01-03 ENCOUNTER — Observation Stay (HOSPITAL_COMMUNITY)
Admission: EM | Admit: 2014-01-03 | Discharge: 2014-01-05 | Disposition: A | Discharge status: Home / Self Care | Payer: Medicare Other | Attending: Internal Medicine | Admitting: Internal Medicine

## 2014-01-03 DIAGNOSIS — R7989 Other specified abnormal findings of blood chemistry: Secondary | ICD-10-CM | POA: Diagnosis not present

## 2014-01-03 DIAGNOSIS — R079 Chest pain, unspecified: Secondary | ICD-10-CM | POA: Diagnosis not present

## 2014-01-03 DIAGNOSIS — R0789 Other chest pain: Principal | ICD-10-CM

## 2014-01-03 DIAGNOSIS — K838 Other specified diseases of biliary tract: Secondary | ICD-10-CM | POA: Diagnosis not present

## 2014-01-03 DIAGNOSIS — F411 Generalized anxiety disorder: Secondary | ICD-10-CM | POA: Diagnosis not present

## 2014-01-03 DIAGNOSIS — I10 Essential (primary) hypertension: Secondary | ICD-10-CM | POA: Diagnosis present

## 2014-01-03 DIAGNOSIS — K862 Cyst of pancreas: Secondary | ICD-10-CM | POA: Diagnosis not present

## 2014-01-03 DIAGNOSIS — M23205 Derangement of unspecified medial meniscus due to old tear or injury, unspecified knee: Secondary | ICD-10-CM

## 2014-01-03 DIAGNOSIS — Z79899 Other long term (current) drug therapy: Secondary | ICD-10-CM | POA: Insufficient documentation

## 2014-01-03 DIAGNOSIS — I252 Old myocardial infarction: Secondary | ICD-10-CM | POA: Insufficient documentation

## 2014-01-03 DIAGNOSIS — K219 Gastro-esophageal reflux disease without esophagitis: Secondary | ICD-10-CM | POA: Insufficient documentation

## 2014-01-03 DIAGNOSIS — K863 Pseudocyst of pancreas: Secondary | ICD-10-CM | POA: Insufficient documentation

## 2014-01-03 DIAGNOSIS — E785 Hyperlipidemia, unspecified: Secondary | ICD-10-CM | POA: Diagnosis not present

## 2014-01-03 DIAGNOSIS — IMO0001 Reserved for inherently not codable concepts without codable children: Secondary | ICD-10-CM | POA: Diagnosis not present

## 2014-01-03 DIAGNOSIS — R197 Diarrhea, unspecified: Secondary | ICD-10-CM | POA: Diagnosis not present

## 2014-01-03 DIAGNOSIS — R112 Nausea with vomiting, unspecified: Secondary | ICD-10-CM | POA: Diagnosis not present

## 2014-01-03 DIAGNOSIS — R7401 Elevation of levels of liver transaminase levels: Secondary | ICD-10-CM

## 2014-01-03 DIAGNOSIS — F329 Major depressive disorder, single episode, unspecified: Secondary | ICD-10-CM | POA: Diagnosis not present

## 2014-01-03 DIAGNOSIS — F3289 Other specified depressive episodes: Secondary | ICD-10-CM | POA: Diagnosis not present

## 2014-01-03 DIAGNOSIS — M1711 Unilateral primary osteoarthritis, right knee: Secondary | ICD-10-CM

## 2014-01-03 DIAGNOSIS — F419 Anxiety disorder, unspecified: Secondary | ICD-10-CM | POA: Diagnosis present

## 2014-01-03 DIAGNOSIS — R740 Nonspecific elevation of levels of transaminase and lactic acid dehydrogenase [LDH]: Secondary | ICD-10-CM

## 2014-01-03 DIAGNOSIS — Z9884 Bariatric surgery status: Secondary | ICD-10-CM | POA: Insufficient documentation

## 2014-01-03 DIAGNOSIS — K831 Obstruction of bile duct: Secondary | ICD-10-CM

## 2014-01-03 DIAGNOSIS — I2510 Atherosclerotic heart disease of native coronary artery without angina pectoris: Secondary | ICD-10-CM | POA: Diagnosis present

## 2014-01-03 DIAGNOSIS — F32A Depression, unspecified: Secondary | ICD-10-CM | POA: Diagnosis present

## 2014-01-03 DIAGNOSIS — Z87891 Personal history of nicotine dependence: Secondary | ICD-10-CM | POA: Insufficient documentation

## 2014-01-03 DIAGNOSIS — R7402 Elevation of levels of lactic acid dehydrogenase (LDH): Secondary | ICD-10-CM | POA: Diagnosis not present

## 2014-01-03 HISTORY — DX: Fibromyalgia: M79.7

## 2014-01-03 HISTORY — DX: Anxiety disorder, unspecified: F41.9

## 2014-01-03 HISTORY — DX: Obesity, unspecified: E66.9

## 2014-01-03 HISTORY — DX: Gastrointestinal hemorrhage, unspecified: K92.2

## 2014-01-03 LAB — CBC WITH DIFFERENTIAL/PLATELET
Basophils Absolute: 0 10*3/uL (ref 0.0–0.1)
Basophils Relative: 0 % (ref 0–1)
Eosinophils Absolute: 0.1 10*3/uL (ref 0.0–0.7)
Eosinophils Relative: 2 % (ref 0–5)
HCT: 36.2 % (ref 36.0–46.0)
Hemoglobin: 12.4 g/dL (ref 12.0–15.0)
Lymphocytes Relative: 38 % (ref 12–46)
Lymphs Abs: 2.1 10*3/uL (ref 0.7–4.0)
MCH: 31.1 pg (ref 26.0–34.0)
MCHC: 34.3 g/dL (ref 30.0–36.0)
MCV: 90.7 fL (ref 78.0–100.0)
Monocytes Absolute: 0.3 10*3/uL (ref 0.1–1.0)
Monocytes Relative: 6 % (ref 3–12)
Neutro Abs: 3 10*3/uL (ref 1.7–7.7)
Neutrophils Relative %: 54 % (ref 43–77)
Platelets: 251 10*3/uL (ref 150–400)
RBC: 3.99 MIL/uL (ref 3.87–5.11)
RDW: 12.8 % (ref 11.5–15.5)
WBC: 5.5 10*3/uL (ref 4.0–10.5)

## 2014-01-03 LAB — POCT I-STAT TROPONIN I: Troponin i, poc: 0 ng/mL (ref 0.00–0.08)

## 2014-01-03 LAB — COMPREHENSIVE METABOLIC PANEL
ALT: 60 U/L — ABNORMAL HIGH (ref 0–35)
AST: 164 U/L — ABNORMAL HIGH (ref 0–37)
Albumin: 3.9 g/dL (ref 3.5–5.2)
Alkaline Phosphatase: 148 U/L — ABNORMAL HIGH (ref 39–117)
BUN: 16 mg/dL (ref 6–23)
CO2: 25 mEq/L (ref 19–32)
Calcium: 9.2 mg/dL (ref 8.4–10.5)
Chloride: 103 mEq/L (ref 96–112)
Creatinine, Ser: 0.88 mg/dL (ref 0.50–1.10)
GFR calc Af Amer: 77 mL/min — ABNORMAL LOW (ref >90)
GFR calc non Af Amer: 66 mL/min — ABNORMAL LOW (ref >90)
Glucose, Bld: 104 mg/dL — ABNORMAL HIGH (ref 70–99)
Potassium: 4.2 mEq/L (ref 3.7–5.3)
Sodium: 140 mEq/L (ref 137–147)
Total Bilirubin: 0.7 mg/dL (ref 0.3–1.2)
Total Protein: 7 g/dL (ref 6.0–8.3)

## 2014-01-03 LAB — D-DIMER, QUANTITATIVE: D-Dimer, Quant: 0.32 ug/mL-FEU (ref 0.00–0.48)

## 2014-01-03 LAB — LIPASE, BLOOD: Lipase: 33 U/L (ref 11–59)

## 2014-01-03 MED ORDER — MORPHINE SULFATE 4 MG/ML IJ SOLN
4.0000 mg | Freq: Once | INTRAMUSCULAR | Status: AC | PRN
  Administered 2014-01-03: 4 mg via INTRAVENOUS
  Filled 2014-01-03: qty 1

## 2014-01-03 MED ORDER — SODIUM CHLORIDE 0.9 % IV SOLN
INTRAVENOUS | Status: DC
  Administered 2014-01-03: 75 mL/h via INTRAVENOUS

## 2014-01-03 MED ORDER — HEPARIN (PORCINE) IN NACL 100-0.45 UNIT/ML-% IJ SOLN
700.0000 [IU]/h | INTRAMUSCULAR | Status: DC
  Administered 2014-01-03: 700 [IU]/h via INTRAVENOUS
  Filled 2014-01-03: qty 250

## 2014-01-03 MED ORDER — ONDANSETRON HCL 4 MG/2ML IJ SOLN
4.0000 mg | Freq: Once | INTRAMUSCULAR | Status: AC
Start: 2014-01-03 — End: 2014-01-03
  Administered 2014-01-03: 4 mg via INTRAVENOUS
  Filled 2014-01-03: qty 2

## 2014-01-03 MED ORDER — IOHEXOL 350 MG/ML SOLN
125.0000 mL | Freq: Once | INTRAVENOUS | Status: AC | PRN
  Administered 2014-01-03: 125 mL via INTRAVENOUS

## 2014-01-03 MED ORDER — ALPRAZOLAM 0.5 MG PO TABS: 0.5000 mg | ORAL_TABLET | ORAL | Status: DC | PRN

## 2014-01-03 MED ORDER — VITAMIN D (ERGOCALCIFEROL) 1.25 MG (50000 UNIT) PO CAPS
50000.0000 [IU] | ORAL_CAPSULE | ORAL | Status: DC
Start: 2014-01-05 — End: 2014-01-05
  Administered 2014-01-05: 50000 [IU] via ORAL
  Filled 2014-01-03 (×2): qty 1

## 2014-01-03 MED ORDER — IOHEXOL 350 MG/ML SOLN: 100.0000 mL | Freq: Once | INTRAVENOUS | Status: AC | PRN

## 2014-01-03 MED ORDER — HEPARIN BOLUS VIA INFUSION
3000.0000 [IU] | Freq: Once | INTRAVENOUS | Status: AC
  Administered 2014-01-03: 3000 [IU] via INTRAVENOUS
  Filled 2014-01-03: qty 3000

## 2014-01-03 MED ORDER — ACETAMINOPHEN 325 MG PO TABS
650.0000 mg | ORAL_TABLET | ORAL | Status: DC | PRN
  Administered 2014-01-04 – 2014-01-05 (×2): 650 mg via ORAL
  Filled 2014-01-03 (×2): qty 2

## 2014-01-03 MED ORDER — ASPIRIN 81 MG PO CHEW
324.0000 mg | CHEWABLE_TABLET | Freq: Once | ORAL | Status: AC
  Administered 2014-01-03: 324 mg via ORAL
  Filled 2014-01-03: qty 4

## 2014-01-03 MED ORDER — ONDANSETRON HCL 4 MG/2ML IJ SOLN
4.0000 mg | Freq: Four times a day (QID) | INTRAMUSCULAR | Status: DC | PRN
  Administered 2014-01-03 – 2014-01-04 (×3): 4 mg via INTRAVENOUS
  Filled 2014-01-03 (×3): qty 2

## 2014-01-03 MED ORDER — MORPHINE SULFATE 2 MG/ML IJ SOLN
2.0000 mg | INTRAMUSCULAR | Status: DC | PRN
  Administered 2014-01-04: 2 mg via INTRAVENOUS
  Filled 2014-01-03: qty 1

## 2014-01-03 MED ORDER — NITROGLYCERIN 2 % TD OINT
1.0000 [in_us] | TOPICAL_OINTMENT | Freq: Four times a day (QID) | TRANSDERMAL | Status: DC
  Administered 2014-01-03 – 2014-01-04 (×3): 1 [in_us] via TOPICAL
  Filled 2014-01-03 (×3): qty 30

## 2014-01-03 MED ORDER — CLOPIDOGREL BISULFATE 75 MG PO TABS
75.0000 mg | ORAL_TABLET | Freq: Every day | ORAL | Status: DC
  Administered 2014-01-04 – 2014-01-05 (×2): 75 mg via ORAL
  Filled 2014-01-03 (×3): qty 1

## 2014-01-03 MED ORDER — ENALAPRIL MALEATE 5 MG PO TABS
5.0000 mg | ORAL_TABLET | Freq: Every day | ORAL | Status: DC
  Administered 2014-01-04 – 2014-01-05 (×2): 5 mg via ORAL
  Filled 2014-01-03 (×3): qty 1

## 2014-01-03 MED ORDER — CARVEDILOL 3.125 MG PO TABS
3.1250 mg | ORAL_TABLET | Freq: Two times a day (BID) | ORAL | Status: DC
  Administered 2014-01-04 – 2014-01-05 (×3): 3.125 mg via ORAL
  Filled 2014-01-03 (×5): qty 1

## 2014-01-03 MED ORDER — CALCIUM CARBONATE-VITAMIN D 500-200 MG-UNIT PO TABS
1.0000 | ORAL_TABLET | Freq: Every day | ORAL | Status: DC
  Administered 2014-01-04 – 2014-01-05 (×2): 1 via ORAL
  Filled 2014-01-03 (×2): qty 1

## 2014-01-03 MED ORDER — NITROGLYCERIN 0.4 MG SL SUBL
0.4000 mg | SUBLINGUAL_TABLET | SUBLINGUAL | Status: DC | PRN
Start: 2014-01-03 — End: 2014-01-05

## 2014-01-03 MED ORDER — ADULT MULTIVITAMIN W/MINERALS CH
2.0000 | ORAL_TABLET | Freq: Every day | ORAL | Status: DC
  Administered 2014-01-04 – 2014-01-05 (×2): 2 via ORAL
  Filled 2014-01-03 (×3): qty 2

## 2014-01-03 MED ORDER — ONDANSETRON HCL 4 MG/2ML IJ SOLN
4.0000 mg | Freq: Once | INTRAMUSCULAR | Status: AC | PRN
  Administered 2014-01-03: 4 mg via INTRAVENOUS
  Filled 2014-01-03: qty 2

## 2014-01-03 MED ORDER — BUPROPION HCL ER (XL) 300 MG PO TB24
300.0000 mg | ORAL_TABLET | Freq: Every day | ORAL | Status: DC
  Administered 2014-01-04 – 2014-01-05 (×2): 300 mg via ORAL
  Filled 2014-01-03 (×2): qty 1

## 2014-01-03 MED ORDER — PANTOPRAZOLE SODIUM 40 MG IV SOLR
40.0000 mg | Freq: Two times a day (BID) | INTRAVENOUS | Status: DC
  Administered 2014-01-03 – 2014-01-05 (×4): 40 mg via INTRAVENOUS
  Filled 2014-01-03 (×5): qty 40

## 2014-01-03 MED ORDER — ATORVASTATIN CALCIUM 10 MG PO TABS
10.0000 mg | ORAL_TABLET | Freq: Every day | ORAL | Status: DC
  Filled 2014-01-03: qty 1

## 2014-01-03 MED ORDER — PANTOPRAZOLE SODIUM 40 MG IV SOLR
40.0000 mg | Freq: Once | INTRAVENOUS | Status: AC
  Administered 2014-01-03: 40 mg via INTRAVENOUS
  Filled 2014-01-03: qty 40

## 2014-01-03 NOTE — ED Notes (Signed)
Paged/called M. Short, MD to add as provider so pt could be transferred to floor.

## 2014-01-03 NOTE — ED Notes (Signed)
She c/o epigastric/xyhoid area pain which has been coming in "spasms"; and is radiating into her upper back. She further tells me she has hx of M.I. During which she had normal EKG.  She underwent cardiac cath. With Dr. Aldona Bar and was found to have sufficient collateral, so did not require stenting.  Her skin is pale, warm and dry and she is breathing normally.

## 2014-01-03 NOTE — ED Notes (Signed)
Pt reports was on flight from [place] "does not know if threw clot." EDP, Tomi Bamberger made aware of above statement.

## 2014-01-03 NOTE — ED Notes (Signed)
Called CT they have verbalized they will complete CT prior to transfer.

## 2014-01-03 NOTE — H&P (Addendum)
[facility] History and Physical  LORETHA URE FAO:[id number] DOB: 06/22/1946 DOA: 01/03/2014  Referring physician:  Dorie Rank PCP:  Jerlyn Ly, MD  Cardiologist:  Dr. Sallyanne Kuster  Chief Complaint:  Chest pressure  HPI:  The patient is a 68 y.o. year-old female with history of CAD (NSTEMI 2005 found to have severe disease in ramus intermedius too diffusely diseased to allow PCI), HTN, HLD, GERD, previous obesity and pre-diabetes s/p gastric bypass surgery who presents with chest pain.  The patient was last at their baseline health until two days ago.  She was on vacation in [place] when she developed watery diarrhea which persisted for 2 days. She flew back from [place] last night. Today, she woke up feeling well and she took some Imodium and has not had any diarrhea since. She was at the mall walking with her family when she developed some numbness and pain of her left thumb and radial wrist.  Immediately thereafter she developed a tightening across her chest which was about a 4/10, however 8 quickly progressed to severe 10 out of 10 chest tightness with associated shortness of breath.  Pain radiates to her back and her left neck/head. Her husband drove her immediately to the emergency department. She developed nausea and gagging, but denies lightheadedness and diaphoresis.  She has received nitroglycerin paste which has alleviated some of her pain.  In the ER, initial troponin negative and ECG with new T-wave inversion in aVL, otherwise no T-wave inversions or ST-segment elevations ( I think there may have been lead reversal of V1 and V2).  CXR unremarkable, labs notable for mild transaminitis which is chronic.  Lipase negative.  D-dimer negative.  Review of Systems:  General:  Denies fevers, chills, weight loss or gain HEENT:  Denies changes to hearing and vision, rhinorrhea, sinus congestion, sore throat CV: per HPI  PULM:  Denies wheezing, cough.   GI:  Per HPI  GU:  Denies dysuria,  frequency, urgency ENDO:  Denies polyuria, polydipsia.   HEME:  Denies hematemesis, blood in stools, melena, abnormal bruising or bleeding.  LYMPH:  Denies lymphadenopathy.   MSK:  Denies arthralgias, myalgias.   DERM:  Denies skin rash or ulcer.   NEURO:  Denies focal numbness, weakness, slurred speech, confusion, facial droop.  PSYCH:  Denies anxiety and depression.    Past Medical History  Diagnosis Date  . Hypertension   . Coronary artery disease   . Depression   . GERD (gastroesophageal reflux disease)   . Fibromyalgia   . Arthritis   . Anemia     from bypass- iron and B12  . Acute MI 11/2004    NONSTEMI 2005 NOINTERVENTION PER NOTE   . Post-menopausal   . Scoliosis   . Hyperlipidemia   . Obesity     hx of bipass, now resolved.  borderline diabetes also resolved.  Marland Kitchen GIB (gastrointestinal bleeding)     while on celecoxib post bypass  . Anxiety    Past Surgical History  Procedure Laterality Date  . Gastric bypass  01/2004  . Cardiac catheterization      2005 Severely disease small optional diagonal vessel responsible far wall motion abnormalities wise to small and diffusely diseased for consideration of interventio.  . Colonoscopy    . Laparoscopic cholecystectomy  1993  . Total vaginal hysterectomy  1973    secondary to DUB - Dr. Connye Burkitt and Dr. Tamala Julian  . Knee arthroscopy Right   . Cataract extraction     Social History:  reports that she quit smoking about 17 years ago. Her smoking use included Cigarettes. She has a 1.25 pack-year smoking history. She has never used smokeless tobacco. She reports that she drinks alcohol. She reports that she does not use illicit drugs. Pt lives at home with family.  Drives and does not use assist device.   Caffeine Use: 2 cups daily   Allergies  Allergen Reactions  . Aspirin     PT CANNOT TAKE DUE TO HX OF GASTRIC BYPASS SURGERY   . Erythromycin     Family History  Problem Relation Age of Onset  . Heart failure Mother   .  Liver cancer Father   . Lung cancer Brother 42  . Cancer Brother   . Heart failure Maternal Grandmother   . Aneurysm Maternal Grandmother     ruptured abdominal aortic aneurysm     Prior to Admission medications   Medication Sig Start Date End Date Taking? Authorizing Provider  ALPRAZolam Duanne Moron) 0.5 MG tablet Take 0.5 mg by mouth daily as needed for anxiety.    Yes Historical Provider, MD  amLODipine (NORVASC) 5 MG tablet Take 5 mg by mouth daily with breakfast.   Yes Historical Provider, MD  buPROPion (WELLBUTRIN XL) 300 MG 24 hr tablet Take 300 mg by mouth daily.   Yes Historical Provider, MD  calcium-vitamin D (OSCAL WITH D) 500-200 MG-UNIT per tablet Take 1 tablet by mouth daily.   Yes Historical Provider, MD  clopidogrel (PLAVIX) 75 MG tablet Take 75 mg by mouth daily with breakfast.   Yes Historical Provider, MD  enalapril (VASOTEC) 5 MG tablet Take 5 mg by mouth daily with breakfast.   Yes Historical Provider, MD  LORazepam (ATIVAN) 1 MG tablet Take 1 mg by mouth at bedtime as needed for anxiety.  05/11/13  Yes Historical Provider, MD  Multiple Vitamin (MULTIVITAMIN WITH MINERALS) TABS Take 2 tablets by mouth daily.   Yes Historical Provider, MD  pantoprazole (PROTONIX) 40 MG tablet Take 40 mg by mouth daily with breakfast.   Yes Historical Provider, MD  Pitavastatin Calcium (LIVALO) 2 MG TABS Take 1 tablet by mouth 3 (three) times a week.    Historical Provider, MD  Vitamin D, Ergocalciferol, (DRISDOL) 50000 UNITS CAPS Take 50,000 Units by mouth 3 (three) times a week.    Historical Provider, MD   Physical Exam: Filed Vitals:   01/03/14 1254 01/03/14 1548 01/03/14 1600  BP: 109/79 160/82   Pulse: 69 77 77  Resp: 15 14 19   SpO2: 100% 98% 100%     General:  Thin CF, NAD, occasionally holding chest  Eyes:  PERRL, anicteric, non-injected.  ENT:  Nares clear.  OP clear, non-erythematous without plaques or exudates.  MMM.  Neck:  Supple without TM or JVD.    Lymph:  No  cervical, supraclavicular, or submandibular LAD.  Cardiovascular:  RRR, normal S1, S2, without m/r/g.  2+ pulses, warm extremities  Respiratory:  CTA bilaterally without increased WOB.  Abdomen:  NABS.  Soft, ND.  TTP in the epigastrium without rebound or guarding  Skin:  No rashes or focal lesions.  Musculoskeletal:  Normal bulk and tone.  No LE edema.  Psychiatric:  A & O x 4.  Appropriate affect.  Neurologic:  CN 3-12 intact.  5/5 strength.  Sensation intact.  Labs on Admission:  Basic Metabolic Panel:  Recent Labs Lab 01/03/14 1320  NA 140  K 4.2  CL 103  CO2 25  GLUCOSE 104*  BUN 16  CREATININE 0.88  CALCIUM 9.2   Liver Function Tests:  Recent Labs Lab 01/03/14 1320  AST 164*  ALT 60*  ALKPHOS 148*  BILITOT 0.7  PROT 7.0  ALBUMIN 3.9    Recent Labs Lab 01/03/14 1507  LIPASE 33   No results found for this basename: AMMONIA,  in the last 168 hours CBC:  Recent Labs Lab 01/03/14 1320  WBC 5.5  NEUTROABS 3.0  HGB 12.4  HCT 36.2  MCV 90.7  PLT 251   Cardiac Enzymes: No results found for this basename: CKTOTAL, CKMB, CKMBINDEX, TROPONINI,  in the last 168 hours  BNP (last 3 results) No results found for this basename: PROBNP,  in the last 8760 hours CBG: No results found for this basename: GLUCAP,  in the last 168 hours  Radiological Exams on Admission: Dg Chest 2 View  01/03/2014   CLINICAL DATA:  Epigastric and mid chest pain with vomiting  EXAM: CHEST  2 VIEW  COMPARISON:  PA and lateral chest x-ray dated September 17, 2013  FINDINGS: The lungs are mildly hyperinflated. There is no focal infiltrate. There is no pleural effusion or pneumothorax. The cardiac silhouette is normal in size. The pulmonary vascularity is not engorged. The mediastinum is normal in width. There is mild tortuosity of the descending thoracic aorta. There is curvature of the lower thoracic spine in an S-shaped configuration.  IMPRESSION: There is hyperinflation consistent  with COPD or reactive airway disease. There is no evidence of pneumonia nor CHF nor other acute cardiopulmonary abnormality.   Electronically Signed   By: David  Martinique   On: 01/03/2014 14:01    EKG: Independently reviewed.  New T-wave inversion in aVL, otherwise no T-wave inversions or ST-segment elevations ( I think there may have been lead reversal of V1 and V2)  Assessment/Plan Principal Problem:   Chest pain Active Problems:   CAD (coronary artery disease)   Hyperlipidemia   HTN (hypertension)   Anxiety   Depression  ---  Chest pain with radiation to back.  DDx includes unstable angina, lipase-negative pancreatitis, PUD, aortic dissection.  D-dimer negative so PE less likely.  Of note, patient had history of GIB while taking celebrex so judicious use of NSAIDS and careful monitoring for bleeding if anticoagulated.  HEART score of 6 (1 for story, 1 for ECG, 2 for age, 2 for known hx, 0 for trop).   -  Admit to telemetry -  CT angio chest/abd/pelvis to rule out dissection -  If negative, plan to heparinize -  Given ASA in ER -  Continue plavix  -  Hold norvasc -  Start carvedilol -  NTG paste  -  A1c and lipid panel -  Due to formulary restrictions, statin will be atorvastatin (monitor LFTs carefully) -  Cardiology consult for possible catheterization  HTN/HLD, blood pressure mildly elevated, likely secondary to pain -  Treat pain -  BP meds as above  For possible gastritis/PUD -  Start BID protonix -  CLD for now  Depression/anxiety -  Continue xanax and bupropion  Diet:  CLD Access:  PIV IVF:  yes Proph:  Pending CT angio   Code Status: full Family Communication: patient, husband and son who is a geriatric nurse Disposition Plan: Admit to telemetry  Time spent: 60 min Janece Canterbury [facility] Pager [phone]  If 7PM-7AM, please contact night-coverage [email or web address] Password [facility] 01/03/2014, 4:34 PM

## 2014-01-03 NOTE — ED Notes (Addendum)
Shirlee Limerick, primary nurse on floor called reporting delay in transfer related to CT.

## 2014-01-03 NOTE — ED Provider Notes (Signed)
CSN: [id number]     Arrival date & time 01/03/14  1245 History   First MD Initiated Contact with Patient 01/03/14 1414     Chief Complaint  Patient presents with  . Chest Pain    HPI Pt started having pain in her chest  area around 1pm.  The pain was mild initially but the pain then increased.  It started to feel like a tightness and squeezing.  She felt short of breath.  It radiates to the back.  The pain comes and goes.  She did not have pain like this with her anginal pain  but at that time the patient had relatively mild symptoms. She does have history of cardiac catheterization associated with acute MI. This was in 2005. Patient also has history of gastroesophageal reflux disease. Patient denies any burning or belching. She denies any leg swelling. [place] did [place] from [place] and just got back last night. Patient is concerned, because when she had her heart attack last time it was only picked up on serial blood testing. Past Medical History  Diagnosis Date  . Hypertension   . Coronary artery disease   . Depression   . GERD (gastroesophageal reflux disease)   . Neuromuscular disorder     hx of fibromyalgia- not seeing md   . Arthritis   . Anemia     from bypass- iron and B12  . Acute MI 11/2004    NONSTEMI 2005 NOINTERVENTION PER NOTE   . Post-menopausal   . Scoliosis   . Hyperlipidemia   . Obesity     hx of bipass, now resolved.  borderline diabetes also resolved.  Marland Kitchen GIB (gastrointestinal bleeding)     while on celecoxib post bypass   Past Surgical History  Procedure Laterality Date  . Gastric bypass  01/2004  . Cardiac catheterization      2005 Severely disease small optional diagonal vessel responsible far wall motion abnormalities wise to small and diffusely diseased for consideration of interventio.  . Colonoscopy    . Laparoscopic cholecystectomy  1993  . Total vaginal hysterectomy  1973    secondary to DUB - Dr. Connye Burkitt and Dr. Tamala Julian  . Knee arthroscopy Right    . Cataract extraction     Family History  Problem Relation Age of Onset  . Heart failure Mother   . Liver cancer Father   . Lung cancer Brother 25  . Cancer Brother   . Heart failure Maternal Grandmother   . Aneurysm Maternal Grandmother     ruptured abdominal aortic aneurysm   History  Substance Use Topics  . Smoking status: Former Smoker -- 0.25 packs/day for 5 years    Types: Cigarettes    Quit date: 12/18/1996  . Smokeless tobacco: Never Used  . Alcohol Use: 0.0 oz/week    1-2 Glasses of wine per week     Comment: occasional alcohol, wine.     OB History   Grav Para Term Preterm Abortions TAB SAB Ect Mult Living   2 2             Obstetric Comments   Adopted a daughter at age 66  01/03/1981.  Died at age 74 from murder by husband. She had 2 children. The patient has raised the children.     Review of Systems  Gastrointestinal: Positive for diarrhea.  All other systems reviewed and are negative.    Allergies  Aspirin and Erythromycin  Home Medications   Current Outpatient Rx  Name  Route  Sig  Dispense  Refill  . ALPRAZolam (XANAX) 0.5 MG tablet   Oral   Take 0.5 mg by mouth daily as needed for anxiety.          Marland Kitchen amLODipine (NORVASC) 5 MG tablet   Oral   Take 5 mg by mouth daily with breakfast.         . buPROPion (WELLBUTRIN XL) 300 MG 24 hr tablet   Oral   Take 300 mg by mouth daily.         . calcium-vitamin D (OSCAL WITH D) 500-200 MG-UNIT per tablet   Oral   Take 1 tablet by mouth daily.         . clopidogrel (PLAVIX) 75 MG tablet   Oral   Take 75 mg by mouth daily with breakfast.         . enalapril (VASOTEC) 5 MG tablet   Oral   Take 5 mg by mouth daily with breakfast.         . LORazepam (ATIVAN) 1 MG tablet   Oral   Take 1 mg by mouth at bedtime as needed for anxiety.          . Multiple Vitamin (MULTIVITAMIN WITH MINERALS) TABS   Oral   Take 2 tablets by mouth daily.         . pantoprazole (PROTONIX) 40 MG tablet    Oral   Take 40 mg by mouth daily with breakfast.         . Pitavastatin Calcium (LIVALO) 2 MG TABS   Oral   Take 1 tablet by mouth 3 (three) times a week.         . Vitamin D, Ergocalciferol, (DRISDOL) 50000 UNITS CAPS   Oral   Take 50,000 Units by mouth 3 (three) times a week.          BP 160/82  Pulse 77  Resp 19  SpO2 100% Physical Exam  Nursing note and vitals reviewed. Constitutional: She appears well-developed and well-nourished. No distress.  HENT:  Head: Normocephalic and atraumatic.  Right Ear: External ear normal.  Left Ear: External ear normal.  Eyes: Conjunctivae are normal. Right eye exhibits no discharge. Left eye exhibits no discharge. No scleral icterus.  Neck: Neck supple. No tracheal deviation present.  Cardiovascular: Normal rate, regular rhythm and intact distal pulses.   Pulmonary/Chest: Effort normal and breath sounds normal. No stridor. No respiratory distress. She has no wheezes. She has no rales.  Abdominal: Soft. Bowel sounds are normal. She exhibits no distension. There is no tenderness. There is no rebound and no guarding.  Musculoskeletal: She exhibits no edema and no tenderness.  Neurological: She is alert. She has normal strength. No cranial nerve deficit (no facial droop, extraocular movements intact, no slurred speech) or sensory deficit. She exhibits normal muscle tone. She displays no seizure activity. Coordination normal.  Skin: Skin is warm and dry. No rash noted.  Psychiatric: She has a normal mood and affect.    ED Course  Procedures (including critical care time) Labs Review Labs Reviewed  COMPREHENSIVE METABOLIC PANEL - Abnormal; Notable for the following:    Glucose, Bld 104 (*)    AST 164 (*)    ALT 60 (*)    Alkaline Phosphatase 148 (*)    GFR calc non Af Amer 66 (*)    GFR calc Af Amer 77 (*)    All other components within normal limits  CBC WITH DIFFERENTIAL  D-DIMER, QUANTITATIVE  LIPASE, BLOOD  POCT I-STAT TROPONIN  I   Imaging Review Dg Chest 2 View  01/03/2014   CLINICAL DATA:  Epigastric and mid chest pain with vomiting  EXAM: CHEST  2 VIEW  COMPARISON:  PA and lateral chest x-ray dated September 17, 2013  FINDINGS: The lungs are mildly hyperinflated. There is no focal infiltrate. There is no pleural effusion or pneumothorax. The cardiac silhouette is normal in size. The pulmonary vascularity is not engorged. The mediastinum is normal in width. There is mild tortuosity of the descending thoracic aorta. There is curvature of the lower thoracic spine in an S-shaped configuration.  IMPRESSION: There is hyperinflation consistent with COPD or reactive airway disease. There is no evidence of pneumonia nor CHF nor other acute cardiopulmonary abnormality.   Electronically Signed   By: David  Martinique   On: 01/03/2014 14:01    EKG Interpretation    Date/Time:  Saturday January 03 2014 12:52:29 EST Ventricular Rate:  66 PR Interval:  139 QRS Duration: 87 QT Interval:  452 QTC Calculation: 474 R Axis:   71 Text Interpretation:  Sinus rhythm Anteroseptal infarct, age indeterminate No significant change since last tracing Confirmed by Zarion Oliff  MD-J, Marky Buresh ([id number]) on 01/03/2014 2:27:23 PM           Medications  nitroGLYCERIN (NITROGLYN) 2 % ointment 1 inch (1 inch Topical Given 01/03/14 1548)  morphine 4 MG/ML injection 4 mg (4 mg Intravenous Given 01/03/14 1512)  ondansetron (ZOFRAN) injection 4 mg (4 mg Intravenous Given 01/03/14 1548)  pantoprazole (PROTONIX) injection 40 mg (40 mg Intravenous Given 01/03/14 1548)  aspirin chewable tablet 324 mg (324 mg Oral Given 01/03/14 1554)     MDM   1. Chest pain    DOubt PE.  Negative d dimer.  Symptoms could be related to her GERD however she does have significant cardiac history and this may be an anginal equivalent. Will consult with medical service regarding admission for further evaluation.    Kathalene Frames, MD 01/03/14 [phone]

## 2014-01-03 NOTE — Consult Note (Signed)
Reason for Consult: Chest pain, known CAD  Requesting Physician: Short  Cardiologist: Suyash Amory  HPI: This is a 68 y.o. female with a past medical history significant for CAD, gastric bypass and GERD  Kimberly Stone returns for yearly followup. It has been a roughly 9 years since she presented with a non-ST segment elevation myocardial infarction which was related to severe disease in a ramus intermedius artery that was felt to be too diffusely diseased to allow percutaneous intervention.   Until today, she has done well with medical therapy. Today she experienced prolonged retrosternal pain which she describes using Levine's sign, as well as left arm/hand pain. It improved slightly with morphine, more so with topical NTG. She has just returned from a plane trip from [place].  With her previous NSTEMI she had symptoms limited to her left arm, no chest pain.  ECG shows low risk findings (minimal aVL T wave change), troponin normal so far.   Unfortunately, we have had a lot of difficulty identifying a statin she can tolerate. She's currently taking Livalo 3 times a week. Zetia caused liver test abnormalities. She has well compensated systemic hypertension. Knee surgery last year was not complicated.   PMHx:  Past Medical History  Diagnosis Date  . Hypertension   . Coronary artery disease   . Depression   . GERD (gastroesophageal reflux disease)   . Fibromyalgia   . Arthritis   . Anemia     from bypass- iron and B12  . Acute MI 11/2004    NONSTEMI 2005 NOINTERVENTION PER NOTE   . Post-menopausal   . Scoliosis   . Hyperlipidemia   . Obesity     hx of bipass, now resolved.  borderline diabetes also resolved.  Marland Kitchen GIB (gastrointestinal bleeding)     while on celecoxib post bypass  . Anxiety    Past Surgical History  Procedure Laterality Date  . Gastric bypass  01/2004  . Cardiac catheterization      2005 Severely disease small optional diagonal vessel responsible far wall  motion abnormalities wise to small and diffusely diseased for consideration of interventio.  . Colonoscopy    . Laparoscopic cholecystectomy  1993  . Total vaginal hysterectomy  1973    secondary to DUB - Dr. Connye Burkitt and Dr. Tamala Julian  . Knee arthroscopy Right   . Cataract extraction      FAMHx: Family History  Problem Relation Age of Onset  . Heart failure Mother   . Liver cancer Father   . Lung cancer Brother 89  . Cancer Brother   . Heart failure Maternal Grandmother   . Aneurysm Maternal Grandmother     ruptured abdominal aortic aneurysm    SOCHx:  reports that she quit smoking about 17 years ago. Her smoking use included Cigarettes. She has a 1.25 pack-year smoking history. She has never used smokeless tobacco. She reports that she drinks alcohol. She reports that she does not use illicit drugs.  ALLERGIES: Allergies  Allergen Reactions  . Aspirin     PT CANNOT TAKE DUE TO HX OF GASTRIC BYPASS SURGERY   . Erythromycin     ROS: General: Denies fevers, chills, weight loss or gain  HEENT: Denies changes to hearing and vision, rhinorrhea, sinus congestion, sore throat  CV: per HPI  PULM: Denies wheezing, cough.  GI: Per HPI  GU: Denies dysuria, frequency, urgency  ENDO: Denies polyuria, polydipsia.  HEME: Denies hematemesis, blood in stools, melena, abnormal bruising or bleeding.  LYMPH: Denies lymphadenopathy.  MSK: Denies arthralgias, myalgias.  DERM: Denies skin rash or ulcer.  NEURO: Denies focal numbness, weakness, slurred speech, confusion, facial droop.  PSYCH: Denies anxiety and depression.    HOME MEDICATIONS:  (Not in a hospital admission)  HOSPITAL MEDICATIONS: I have reviewed the patient's current medications. Prior to Admission:  (Not in a hospital admission)  VITALS: Blood pressure 137/76, pulse 74, resp. rate 16, SpO2 96.00%.  PHYSICAL EXAM:  General: Alert, oriented x3, no distress Head: no evidence of trauma, PERRL, EOMI, no exophtalmos or  lid lag, no myxedema, no xanthelasma; normal ears, nose and oropharynx Neck: normal jugular venous pulsations and no hepatojugular reflux; brisk carotid pulses without delay and no carotid bruits Chest: clear to auscultation, no signs of consolidation by percussion or palpation, normal fremitus, symmetrical and full respiratory excursions Cardiovascular: normal position and quality of the apical impulse, regular rhythm, normal first and second heart sounds, no murmurs, rubs or gallops Abdomen: no tenderness or distention, no masses by palpation, no abnormal pulsatility or arterial bruits, normal bowel sounds, no hepatosplenomegaly Extremities: no clubbing, cyanosis or edema; 2+ radial, ulnar and brachial pulses bilaterally; 2+ right femoral, posterior tibial and dorsalis pedis pulses; 2+ left femoral, posterior tibial and dorsalis pedis pulses; no subclavian or femoral bruits Neurological: grossly nonfocal  ECG described above  LABS: Results for orders placed during the hospital encounter of 01/03/14 (from the past 48 hour(s))  CBC WITH DIFFERENTIAL     Status: None   Collection Time    01/03/14  1:20 PM      Result Value Range   WBC 5.5  4.0 - 10.5 K/uL   RBC 3.99  3.87 - 5.11 MIL/uL   Hemoglobin 12.4  12.0 - 15.0 g/dL   HCT 36.2  36.0 - 46.0 %   MCV 90.7  78.0 - 100.0 fL   MCH 31.1  26.0 - 34.0 pg   MCHC 34.3  30.0 - 36.0 g/dL   RDW 12.8  11.5 - 15.5 %   Platelets 251  150 - 400 K/uL   Neutrophils Relative % 54  43 - 77 %   Neutro Abs 3.0  1.7 - 7.7 K/uL   Lymphocytes Relative 38  12 - 46 %   Lymphs Abs 2.1  0.7 - 4.0 K/uL   Monocytes Relative 6  3 - 12 %   Monocytes Absolute 0.3  0.1 - 1.0 K/uL   Eosinophils Relative 2  0 - 5 %   Eosinophils Absolute 0.1  0.0 - 0.7 K/uL   Basophils Relative 0  0 - 1 %   Basophils Absolute 0.0  0.0 - 0.1 K/uL  COMPREHENSIVE METABOLIC PANEL     Status: Abnormal   Collection Time    01/03/14  1:20 PM      Result Value Range   Sodium 140  137 -  147 mEq/L   Potassium 4.2  3.7 - 5.3 mEq/L   Chloride 103  96 - 112 mEq/L   CO2 25  19 - 32 mEq/L   Glucose, Bld 104 (*) 70 - 99 mg/dL   BUN 16  6 - 23 mg/dL   Creatinine, Ser 0.88  0.50 - 1.10 mg/dL   Calcium 9.2  8.4 - 10.5 mg/dL   Total Protein 7.0  6.0 - 8.3 g/dL   Albumin 3.9  3.5 - 5.2 g/dL   AST 164 (*) 0 - 37 U/L   ALT 60 (*) 0 - 35 U/L   Alkaline  Phosphatase 148 (*) 39 - 117 U/L   Total Bilirubin 0.7  0.3 - 1.2 mg/dL   GFR calc non Af Amer 66 (*) >90 mL/min   GFR calc Af Amer 77 (*) >90 mL/min   Comment: (NOTE)     The eGFR has been calculated using the CKD EPI equation.     This calculation has not been validated in all clinical situations.     eGFR's persistently <90 mL/min signify possible Chronic Kidney     Disease.  POCT I-STAT TROPONIN I     Status: None   Collection Time    01/03/14  1:26 PM      Result Value Range   Troponin i, poc 0.00  0.00 - 0.08 ng/mL   Comment 3            Comment: Due to the release kinetics of cTnI,     a negative result within the first hours     of the onset of symptoms does not rule out     myocardial infarction with certainty.     If myocardial infarction is still suspected,     repeat the test at appropriate intervals.  D-DIMER, QUANTITATIVE     Status: None   Collection Time    01/03/14  2:30 PM      Result Value Range   D-Dimer, Quant 0.32  0.00 - 0.48 ug/mL-FEU   Comment:            AT THE INHOUSE ESTABLISHED CUTOFF     VALUE OF 0.48 ug/mL FEU,     THIS ASSAY HAS BEEN DOCUMENTED     IN THE LITERATURE TO HAVE     A SENSITIVITY AND NEGATIVE     PREDICTIVE VALUE OF AT LEAST     98 TO 99%.  THE TEST RESULT     SHOULD BE CORRELATED WITH     AN ASSESSMENT OF THE CLINICAL     PROBABILITY OF DVT / VTE.  LIPASE, BLOOD     Status: None   Collection Time    01/03/14  3:07 PM      Result Value Range   Lipase 33  11 - 59 U/L    IMAGING: Dg Chest 2 View  01/03/2014   CLINICAL DATA:  Epigastric and mid chest pain with  vomiting  EXAM: CHEST  2 VIEW  COMPARISON:  PA and lateral chest x-ray dated September 17, 2013  FINDINGS: The lungs are mildly hyperinflated. There is no focal infiltrate. There is no pleural effusion or pneumothorax. The cardiac silhouette is normal in size. The pulmonary vascularity is not engorged. The mediastinum is normal in width. There is mild tortuosity of the descending thoracic aorta. There is curvature of the lower thoracic spine in an S-shaped configuration.  IMPRESSION: There is hyperinflation consistent with COPD or reactive airway disease. There is no evidence of pneumonia nor CHF nor other acute cardiopulmonary abnormality.   Electronically Signed   By: David  Martinique   On: 01/03/2014 14:01    IMPRESSION: Symptoms are highly compatible with unstable angina, but no high risk ECG/troponin. Major differential diagnosis is esophageal spasm, which could also improve with NTG. Now in CT due to concern of PE with recent air travel, but low d-dimer is reassuring.   RECOMMENDATION: If enzymes and ECG remain low risk and symptoms resolve, outpatient nuclear perfusion study (lexiscan) is reasonable. If symptoms persist, will order as inpatient. If enzymes become abnormal or ECG changes progress, proceed to  coronary angiography.  Time Spent Directly with Patient: 45 minutes  Sanda Klein, MD, [place] [phone] office [phone] pager  01/03/2014, 6:04 PM

## 2014-01-03 NOTE — ED Notes (Signed)
Pt request to hold nitro until negative for clots via blood work. EDP, Knapp aware.

## 2014-01-03 NOTE — ED Notes (Signed)
Pt reports "pressure is still there" in chest pt eased and she is able to relax/rest.

## 2014-01-03 NOTE — Progress Notes (Signed)
ANTICOAGULATION CONSULT NOTE - Initial Consult  Pharmacy Consult for Heparin Indication: ACS/STEMI  Allergies  Allergen Reactions  . Aspirin     PT CANNOT TAKE DUE TO HX OF GASTRIC BYPASS SURGERY   . Erythromycin     Patient Measurements: Height: 5\' 2"  (157.5 cm) Weight: 131 lb 9.8 oz (59.7 kg) IBW/kg (Calculated) : 50.1 Heparin Dosing Weight: 60kg  Vital Signs: Temp: 98.1 F (36.7 C) (01/17 1835) Temp src: Oral (01/17 1835) BP: 154/75 mmHg (01/17 1835) Pulse Rate: 74 (01/17 1835)  Labs:  Recent Labs  01/03/14 1320  HGB 12.4  HCT 36.2  PLT 251  CREATININE 0.88    Estimated Creatinine Clearance: 49.1 ml/min (by C-G formula based on Cr of 0.88).   Medical History: Past Medical History  Diagnosis Date  . Hypertension   . Coronary artery disease   . Depression   . GERD (gastroesophageal reflux disease)   . Fibromyalgia   . Arthritis   . Anemia     from bypass- iron and B12  . Acute MI 11/2004    NONSTEMI 2005 NOINTERVENTION PER NOTE   . Post-menopausal   . Scoliosis   . Hyperlipidemia   . Obesity     hx of bipass, now resolved.  borderline diabetes also resolved.  Marland Kitchen GIB (gastrointestinal bleeding)     while on celecoxib post bypass  . Anxiety     Assessment: 26 yoF with PMHx pertinent for CAD, NSTEMI, gastric bypass surgery, HTN, HLD, GERD, and GIB while taking celebrex presents to [facility] with prolonged retrosternal and left arm/hand pain.  Per cardiology note, ECG shows low-risk findings.  1st cardiac enzymes negative.  CT negative for aortic dissection or aneurysm.  Pharmacy consulted to dose IV heparin for ACS/NSTEMI.  Pt received ASA 324mg  x 1 and PTA plavix, ACEI, statin (formulary change) resumed.    Ht 5'2", wt 60kg Renal: SCr 0.88, CrCl ~49 ml/min No baseline PTT, PT/INR CBC WNL, no bleeding noted per RN  Goal of Therapy:  Heparin level 0.3-0.7 units/ml Monitor platelets by anticoagulation protocol: Yes   Plan:  Stat PTT, PT/INR. Bolus IV  heparin 3000 units x 1, then heparin infusion rate at 700 units/hr (=8ml/hr).  F/u 6 hour hour heparin level.   Ralene Bathe, PharmD, BCPS 01/03/2014, 7:06 PM  Pager: [phone]

## 2014-01-03 NOTE — ED Notes (Addendum)
Pt reports a spike in pain 8/10 in chest region. Pt hesitate to use nitro paste. Hospitalist at bedside and explains use of nitro.

## 2014-01-04 DIAGNOSIS — K862 Cyst of pancreas: Secondary | ICD-10-CM

## 2014-01-04 DIAGNOSIS — K863 Pseudocyst of pancreas: Secondary | ICD-10-CM

## 2014-01-04 DIAGNOSIS — K831 Obstruction of bile duct: Secondary | ICD-10-CM

## 2014-01-04 DIAGNOSIS — R079 Chest pain, unspecified: Secondary | ICD-10-CM | POA: Diagnosis not present

## 2014-01-04 DIAGNOSIS — R7401 Elevation of levels of liver transaminase levels: Secondary | ICD-10-CM

## 2014-01-04 DIAGNOSIS — R740 Nonspecific elevation of levels of transaminase and lactic acid dehydrogenase [LDH]: Secondary | ICD-10-CM

## 2014-01-04 DIAGNOSIS — R071 Chest pain on breathing: Secondary | ICD-10-CM | POA: Diagnosis not present

## 2014-01-04 DIAGNOSIS — R0789 Other chest pain: Secondary | ICD-10-CM | POA: Diagnosis not present

## 2014-01-04 DIAGNOSIS — E785 Hyperlipidemia, unspecified: Secondary | ICD-10-CM | POA: Diagnosis not present

## 2014-01-04 DIAGNOSIS — K838 Other specified diseases of biliary tract: Secondary | ICD-10-CM

## 2014-01-04 DIAGNOSIS — I2510 Atherosclerotic heart disease of native coronary artery without angina pectoris: Secondary | ICD-10-CM | POA: Diagnosis not present

## 2014-01-04 DIAGNOSIS — F411 Generalized anxiety disorder: Secondary | ICD-10-CM | POA: Diagnosis not present

## 2014-01-04 DIAGNOSIS — R7402 Elevation of levels of lactic acid dehydrogenase (LDH): Secondary | ICD-10-CM

## 2014-01-04 DIAGNOSIS — I10 Essential (primary) hypertension: Secondary | ICD-10-CM | POA: Diagnosis not present

## 2014-01-04 LAB — CBC
HCT: 34.5 % — ABNORMAL LOW (ref 36.0–46.0)
Hemoglobin: 11.7 g/dL — ABNORMAL LOW (ref 12.0–15.0)
MCH: 30.5 pg (ref 26.0–34.0)
MCHC: 33.9 g/dL (ref 30.0–36.0)
MCV: 89.8 fL (ref 78.0–100.0)
Platelets: 231 10*3/uL (ref 150–400)
RBC: 3.84 MIL/uL — ABNORMAL LOW (ref 3.87–5.11)
RDW: 12.9 % (ref 11.5–15.5)
WBC: 5.3 10*3/uL (ref 4.0–10.5)

## 2014-01-04 LAB — BASIC METABOLIC PANEL
BUN: 10 mg/dL (ref 6–23)
CO2: 24 mEq/L (ref 19–32)
Calcium: 8.9 mg/dL (ref 8.4–10.5)
Chloride: 101 mEq/L (ref 96–112)
Creatinine, Ser: 0.7 mg/dL (ref 0.50–1.10)
GFR calc Af Amer: >90 mL/min (ref >90)
GFR calc non Af Amer: 88 mL/min — ABNORMAL LOW (ref >90)
Glucose, Bld: 108 mg/dL — ABNORMAL HIGH (ref 70–99)
Potassium: 3.9 mEq/L (ref 3.7–5.3)
Sodium: 138 mEq/L (ref 137–147)

## 2014-01-04 LAB — LIPID PANEL
Cholesterol: 165 mg/dL (ref 0–200)
HDL: 79 mg/dL (ref >39)
LDL Cholesterol: 69 mg/dL (ref 0–99)
Total CHOL/HDL Ratio: 2.1 RATIO
Triglycerides: 86 mg/dL (ref <150)
VLDL: 17 mg/dL (ref 0–40)

## 2014-01-04 LAB — APTT: aPTT: 182 seconds — ABNORMAL HIGH (ref 24–37)

## 2014-01-04 LAB — HEPATIC FUNCTION PANEL
ALT: 275 U/L — ABNORMAL HIGH (ref 0–35)
AST: 305 U/L — ABNORMAL HIGH (ref 0–37)
Albumin: 3.4 g/dL — ABNORMAL LOW (ref 3.5–5.2)
Alkaline Phosphatase: 240 U/L — ABNORMAL HIGH (ref 39–117)
Bilirubin, Direct: <0.2 mg/dL (ref 0.0–0.3)
Total Bilirubin: 0.8 mg/dL (ref 0.3–1.2)
Total Protein: 6.5 g/dL (ref 6.0–8.3)

## 2014-01-04 LAB — TROPONIN I
Troponin I: <0.3 ng/mL (ref <0.30)
Troponin I: <0.3 ng/mL (ref <0.30)
Troponin I: <0.3 ng/mL (ref <0.30)

## 2014-01-04 LAB — LIPASE, BLOOD: Lipase: 27 U/L (ref 11–59)

## 2014-01-04 LAB — PROTIME-INR
INR: 0.97 (ref 0.00–1.49)
Prothrombin Time: 12.7 seconds (ref 11.6–15.2)

## 2014-01-04 LAB — ACETAMINOPHEN LEVEL: Acetaminophen (Tylenol), Serum: <15 ug/mL (ref 10–30)

## 2014-01-04 LAB — HEPARIN LEVEL (UNFRACTIONATED): Heparin Unfractionated: 0.42 IU/mL (ref 0.30–0.70)

## 2014-01-04 MED ORDER — KCL IN DEXTROSE-NACL 10-5-0.45 MEQ/L-%-% IV SOLN
INTRAVENOUS | Status: DC
  Administered 2014-01-04: 17:00:00 via INTRAVENOUS
  Filled 2014-01-04 (×3): qty 1000

## 2014-01-04 MED ORDER — HEPARIN SODIUM (PORCINE) 5000 UNIT/ML IJ SOLN: 5000.0000 [IU] | Freq: Three times a day (TID) | INTRAMUSCULAR | Status: DC

## 2014-01-04 MED ORDER — GI COCKTAIL ~~LOC~~
30.0000 mL | Freq: Once | ORAL | Status: DC
  Filled 2014-01-04: qty 30

## 2014-01-04 MED ORDER — ENOXAPARIN SODIUM 40 MG/0.4ML ~~LOC~~ SOLN
40.0000 mg | SUBCUTANEOUS | Status: DC
Start: 2014-01-04 — End: 2014-01-05
  Administered 2014-01-04: 40 mg via SUBCUTANEOUS
  Filled 2014-01-04 (×2): qty 0.4

## 2014-01-04 MED ORDER — PROMETHAZINE HCL 25 MG/ML IJ SOLN
12.5000 mg | Freq: Four times a day (QID) | INTRAMUSCULAR | Status: DC | PRN
  Administered 2014-01-04: 12.5 mg via INTRAVENOUS
  Filled 2014-01-04: qty 1

## 2014-01-04 MED ORDER — AMLODIPINE BESYLATE 5 MG PO TABS
5.0000 mg | ORAL_TABLET | Freq: Every day | ORAL | Status: DC
  Administered 2014-01-04 – 2014-01-05 (×2): 5 mg via ORAL
  Filled 2014-01-04 (×2): qty 1

## 2014-01-04 NOTE — Progress Notes (Signed)
Subjective:  She has had some nausea but is currently feeling better and wants to eat. Her CT scan did not show evidence of dissection or pulmonary embolus.  No recurrent chest pain.  Objective:  Vital Signs in the last 24 hours: BP 140/66  Pulse 75  Temp(Src) 97.8 F (36.6 C) (Oral)  Resp 20  Ht 5\' 2"  (1.575 m)  Wt 59.7 kg (131 lb 9.8 oz)  BMI 24.07 kg/m2  SpO2 99%  Physical Exam: Pleasant white female in no acute distress Lungs:  Clear Cardiac:  Regular rhythm, normal S1 and S2, no S3, 2/6 systolic murmur at aortic area radiating to carotids and bilaterally to subclavians Abdomen:  Soft, nontender, no masses Extremities:  No edema present  Intake/Output from previous day: 01/17 0701 - 01/18 0700 In: 5366 [P.O.:210; I.V.:1581] Out: 1230 [Urine:1150; Emesis/NG output:80]  Weight Filed Weights   01/03/14 1835  Weight: 59.7 kg (131 lb 9.8 oz)    Lab Results: Basic Metabolic Panel:  Recent Labs  01/03/14 1320 01/04/14 0235  NA 140 138  K 4.2 3.9  CL 103 101  CO2 25 24  GLUCOSE 104* 108*  BUN 16 10  CREATININE 0.88 0.70   CBC:  Recent Labs  01/03/14 1320 01/04/14 0235  WBC 5.5 5.3  NEUTROABS 3.0  --   HGB 12.4 11.7*  HCT 36.2 34.5*  MCV 90.7 89.8  PLT 251 231   Cardiac Enzymes:  Recent Labs  01/03/14 2325 01/04/14 0614  TROPONINI <0.30 <0.30    Telemetry: Sinus rhythm overnight  CTA Minimal amount of calcium noted in left main and LAD according to radiologist. She also evidently has some subclavian calcification. Aortic valve calcification. No evidence of pulmonary embolus or dissection.  EKG Normal followup today. Assessment/Plan:  1. Coronary artery disease with diffusely diseased intermediate branch 2. Prolonged chest discomfort uncertain cause with normal troponins and normal EKG and no evidence of pulmonary embolus or dissection  Recommendations:  I think we can stop her heparin and ambulate. She may eat. If she remains stable she  can go home in the morning and have an outpatient myocardial perfusion scan at the office.    Kerry Hough  MD [facility]  01/04/2014, 9:37 AM

## 2014-01-04 NOTE — Progress Notes (Signed)
ANTICOAGULATION CONSULT NOTE - Follow Up Consult  Pharmacy Consult for Heparin Indication: chest pain/ACS  Allergies  Allergen Reactions  . Aspirin     PT CANNOT TAKE DUE TO HX OF GASTRIC BYPASS SURGERY   . Erythromycin     Patient Measurements: Height: 5\' 2"  (157.5 cm) Weight: 131 lb 9.8 oz (59.7 kg) IBW/kg (Calculated) : 50.1 Heparin Dosing Weight:   Vital Signs: Temp: 98 F (36.7 C) (01/17 2155) Temp src: Oral (01/17 2155) BP: 115/64 mmHg (01/17 2155) Pulse Rate: 70 (01/17 2155)  Labs:  Recent Labs  01/03/14 1320 01/03/14 2325 01/04/14 0235  HGB 12.4  --  11.7*  HCT 36.2  --  34.5*  PLT 251  --  231  APTT  --  182*  --   LABPROT  --  12.7  --   INR  --  0.97  --   HEPARINUNFRC  --   --  0.42  CREATININE 0.88  --  0.70  TROPONINI  --  <0.30  --     Estimated Creatinine Clearance: 54 ml/min (by C-G formula based on Cr of 0.7).   Medications:  Infusions:  . sodium chloride 75 mL/hr (01/03/14 2010)  . heparin 700 Units/hr (01/03/14 2011)    Assessment: Patient with heparin level at goal.  No issues per RN.  Goal of Therapy:  Heparin level 0.3-0.7 units/ml Monitor platelets by anticoagulation protocol: Yes   Plan:  Continue heparin at current rate.  Recheck level at 1100.  Tyler Deis, Shea Stakes [place] 01/04/2014,3:50 AM

## 2014-01-04 NOTE — Progress Notes (Signed)
Pt has had 3 epidsodes of nausea and w ith 2 very small emesis, bile.  Resolves quickly c zofran.  Had only one episode of back pain (she states chronic) at 0600 resolved with mso4

## 2014-01-04 NOTE — Progress Notes (Signed)
[facility] PROGRESS NOTE  Kimberly Stone [id number] DOB: 09-06-1946 DOA: 01/03/2014 PCP: Jerlyn Ly, MD  Assessment/Plan  Chest pain with radiation to back. DDx includes unstable angina, lipase-negative pancreatitis, gastritis/PUD. D-dimer negative so PE less likely.   - Telemetry:  NSR in 60s - CT angio chest/abd/pelvis negative for dissection -  D/c heparin and NTG paste - Given ASA in ER  - Continue plavix and carvedilol - restart norvasc  - A1c pending -  Lipid panel LDL 69, HDL 79 - Appreciate cardiology assistance  Nausea and vomiting, may be related to hepatitis or obstruction of biliary tree or gastritis -  Continue zofran -  Add prn phenergan  Transaminitis, at risk for acute hepatitis given recent travel and diarrhea.  Given ductal dilation on CT scan, concern for biliary obstruction also.   -  D/c atorvastatin -  Check acute hepatitis panel -  Check tylenol level -  MRCP -  May need GI consult for ERCP or EUS to also eval pancreatic cyst  HTN/HLD, blood pressure mildly elevated, likely secondary to pain  - Treat pain  - BP meds as above   For possible gastritis/PUD  - continue BID protonix  Pancreatic cyst -  Repeat MRI pancreas in 1 year  Depression/anxiety  - Continue xanax and bupropion  Diet: NPO Access: PIV  IVF: yes  Proph:  Pending further evaluation of transaminitis  Code Status: full  Family Communication: patient, husband and son who is a geriatric nurse  Disposition Plan:  Continue telemetry   Consultants:  Cardiology, Dr. Sallyanne Kuster  Procedures:  CT angio C/A/P  CXR  ECHO  Antibiotics:  None   HPI/Subjective:  Chest pain resolved this morning.  Persistent nausea and vomiting, however.  No diarrhea    Objective: Filed Vitals:   01/03/14 1835 01/03/14 2155 01/04/14 0555 01/04/14 1429  BP: 154/75 115/64 140/66 144/76  Pulse: 74 70 75 69  Temp: 98.1 F (36.7 C) 98 F (36.7 C) 97.8 F (36.6 C) 97.9 F (36.6  C)  TempSrc: Oral Oral Oral Oral  Resp: 16 20 20 18   Height: 5\' 2"  (1.575 m)     Weight: 59.7 kg (131 lb 9.8 oz)     SpO2: 99% 99% 99% 100%    Intake/Output Summary (Last 24 hours) at 01/04/14 1552 Last data filed at 01/04/14 0900  Gross per 24 hour  Intake   1791 ml  Output   1230 ml  Net    561 ml   Filed Weights   01/03/14 1835  Weight: 59.7 kg (131 lb 9.8 oz)    Exam:   General:  CF, No acute distress  HEENT:  NCAT, MMM  Cardiovascular:  RRR, nl S1, S2 no mrg, 2+ pulses, warm extremities  Respiratory:  CTAB, no increased WOB  Abdomen:   NABS, soft, persistently TTP in the epigastrium without rebound or guarding  MSK:   Normal tone and bulk, no LEE  Neuro:  Grossly intact  Data Reviewed: Basic Metabolic Panel:  Recent Labs Lab 01/03/14 1320 01/04/14 0235  NA 140 138  K 4.2 3.9  CL 103 101  CO2 25 24  GLUCOSE 104* 108*  BUN 16 10  CREATININE 0.88 0.70  CALCIUM 9.2 8.9   Liver Function Tests:  Recent Labs Lab 01/03/14 1320 01/04/14 1158  AST 164* 305*  ALT 60* 275*  ALKPHOS 148* 240*  BILITOT 0.7 0.8  PROT 7.0 6.5  ALBUMIN 3.9 3.4*    Recent Labs Lab 01/03/14  1507 01/04/14 1158  LIPASE 33 27   No results found for this basename: AMMONIA,  in the last 168 hours CBC:  Recent Labs Lab 01/03/14 1320 01/04/14 0235  WBC 5.5 5.3  NEUTROABS 3.0  --   HGB 12.4 11.7*  HCT 36.2 34.5*  MCV 90.7 89.8  PLT 251 231   Cardiac Enzymes:  Recent Labs Lab 01/03/14 2325 01/04/14 0614 01/04/14 1158  TROPONINI <0.30 <0.30 <0.30   BNP (last 3 results) No results found for this basename: PROBNP,  in the last 8760 hours CBG: No results found for this basename: GLUCAP,  in the last 168 hours  No results found for this or any previous visit (from the past 240 hour(s)).   Studies: Dg Chest 2 View  01/03/2014   CLINICAL DATA:  Epigastric and mid chest pain with vomiting  EXAM: CHEST  2 VIEW  COMPARISON:  PA and lateral chest x-ray dated  September 17, 2013  FINDINGS: The lungs are mildly hyperinflated. There is no focal infiltrate. There is no pleural effusion or pneumothorax. The cardiac silhouette is normal in size. The pulmonary vascularity is not engorged. The mediastinum is normal in width. There is mild tortuosity of the descending thoracic aorta. There is curvature of the lower thoracic spine in an S-shaped configuration.  IMPRESSION: There is hyperinflation consistent with COPD or reactive airway disease. There is no evidence of pneumonia nor CHF nor other acute cardiopulmonary abnormality.   Electronically Signed   By: David  Martinique   On: 01/03/2014 14:01   Ct Angio Chest Aortic Dissect W &/or W/o  01/03/2014   CLINICAL DATA:  Chest pain which radiates to back. Evaluate for dissection.  EXAM: CT ANGIOGRAPHY CHEST, ABDOMEN AND PELVIS  TECHNIQUE: Multidetector CT imaging through the chest, abdomen and pelvis was performed using the standard protocol during bolus administration of intravenous contrast. Multiplanar reconstructed images including MIPs were obtained and reviewed to evaluate the vascular anatomy.  CONTRAST:  114mL OMNIPAQUE IOHEXOL 350 MG/ML SOLN  COMPARISON:  US ABDOMEN COMPLETE dated 02/23/2009; CT-ABD-PELV dated 10/24/2006  FINDINGS: CTA CHEST FINDINGS  Non IV contrast images demonstrate no intramural hematoma in the thoracic aorta. The contrast-enhanced images demonstrate no evidence of dissection or aneurysm of the thoracic aorta. The great vessels are normal. There is minimal dilatation of the ascending aorta measuring 32 mm x 31 mm. No evidence of pulmonary embolism. No pericardial fluid.  No axillary supraclavicular lymphadenopathy. No mediastinal hilar lymphadenopathy. Esophagus is normal.  Review of the lung parenchyma demonstrates mild scarring in the inferior lingula. No pleural fluid, pneumothorax, or pneumonia.  Review of the MIP images confirms the above findings.  CTA ABDOMEN AND PELVIS FINDINGS  The abdominal aorta  is normal in caliber without evidence of aneurysm or dissection. There is minimal intimal calcification. The SMA is patent. The splenic artery extends from the proper hepatic artery. IMA is patent. No aneurysm of the iliac arteries. There are bilateral single renal arteries.  No focal hepatic lesion. Post cholecystectomy. The common bile duct is dilated with mild intrahepatic biliary duct dilatation. The common bile duct measures 7 mm. The common hepatic duct measures 11 mm. There is no evidence of pancreatic duct dilatation. Small cystic lesion in the inferior aspect of the pancreatic head and image 6 mm (image 122, series 5).  The spleen, adrenal glands, and kidneys are normal.  The stomach demonstrates a post gastric bypass anatomy. No evidence obstruction. No acute findings of the small bowel or colon. A single  loop of dilated small bowel in the mid pelvis with loops measuring 3.2 cm (coronal image 6).  No retroperitoneal periportal lymphadenopathy.  Post hysterectomy anatomy. The bladder is normal. No aggressive osseous lesion. Rounded lucent lesion in the L4 vertebral body likely represents a benign hemangioma.  Review of the MIP images confirms the above findings.  IMPRESSION: 1. No evidence of aortic dissection or aneurysm. 2. No evidence of abdominal aortic dissection or aneurysm. 3. Progressive intra and extrahepatic biliary duct dilatation may relate to prior cholecystectomy. Recommend correlation with liver function tests / bilirubin. If elevated considered MRCP excluded distal lesion. 4. Small cystic lesion in the head of the pancreas. Recommend follow-up MRI pancreatic protocol in 12 months. This recommendation follows ACR consensus guidelines: Managing Incidental Findings on Abdominal CT: White Paper of the ACR Incidental Findings Committee. J Am Coll Radiol 2010;7:754-773 5. Focally dilated loop of small bowel within the pelvis. This likely represents a focal ileus. No evidence of obstruction.    Electronically Signed   By: Suzy Bouchard M.D.   On: 01/03/2014 18:31   Ct Angio Abd/pel W/ And/or W/o  01/03/2014   CLINICAL DATA:  Chest pain which radiates to back. Evaluate for dissection.  EXAM: CT ANGIOGRAPHY CHEST, ABDOMEN AND PELVIS  TECHNIQUE: Multidetector CT imaging through the chest, abdomen and pelvis was performed using the standard protocol during bolus administration of intravenous contrast. Multiplanar reconstructed images including MIPs were obtained and reviewed to evaluate the vascular anatomy.  CONTRAST:  158mL OMNIPAQUE IOHEXOL 350 MG/ML SOLN  COMPARISON:  US ABDOMEN COMPLETE dated 02/23/2009; CT-ABD-PELV dated 10/24/2006  FINDINGS: CTA CHEST FINDINGS  Non IV contrast images demonstrate no intramural hematoma in the thoracic aorta. The contrast-enhanced images demonstrate no evidence of dissection or aneurysm of the thoracic aorta. The great vessels are normal. There is minimal dilatation of the ascending aorta measuring 32 mm x 31 mm. No evidence of pulmonary embolism. No pericardial fluid.  No axillary supraclavicular lymphadenopathy. No mediastinal hilar lymphadenopathy. Esophagus is normal.  Review of the lung parenchyma demonstrates mild scarring in the inferior lingula. No pleural fluid, pneumothorax, or pneumonia.  Review of the MIP images confirms the above findings.  CTA ABDOMEN AND PELVIS FINDINGS  The abdominal aorta is normal in caliber without evidence of aneurysm or dissection. There is minimal intimal calcification. The SMA is patent. The splenic artery extends from the proper hepatic artery. IMA is patent. No aneurysm of the iliac arteries. There are bilateral single renal arteries.  No focal hepatic lesion. Post cholecystectomy. The common bile duct is dilated with mild intrahepatic biliary duct dilatation. The common bile duct measures 7 mm. The common hepatic duct measures 11 mm. There is no evidence of pancreatic duct dilatation. Small cystic lesion in the inferior aspect  of the pancreatic head and image 6 mm (image 122, series 5).  The spleen, adrenal glands, and kidneys are normal.  The stomach demonstrates a post gastric bypass anatomy. No evidence obstruction. No acute findings of the small bowel or colon. A single loop of dilated small bowel in the mid pelvis with loops measuring 3.2 cm (coronal image 6).  No retroperitoneal periportal lymphadenopathy.  Post hysterectomy anatomy. The bladder is normal. No aggressive osseous lesion. Rounded lucent lesion in the L4 vertebral body likely represents a benign hemangioma.  Review of the MIP images confirms the above findings.  IMPRESSION: 1. No evidence of aortic dissection or aneurysm. 2. No evidence of abdominal aortic dissection or aneurysm. 3. Progressive intra and extrahepatic  biliary duct dilatation may relate to prior cholecystectomy. Recommend correlation with liver function tests / bilirubin. If elevated considered MRCP excluded distal lesion. 4. Small cystic lesion in the head of the pancreas. Recommend follow-up MRI pancreatic protocol in 12 months. This recommendation follows ACR consensus guidelines: Managing Incidental Findings on Abdominal CT: White Paper of the ACR Incidental Findings Committee. J Am Coll Radiol 2010;7:754-773 5. Focally dilated loop of small bowel within the pelvis. This likely represents a focal ileus. No evidence of obstruction.   Electronically Signed   By: Suzy Bouchard M.D.   On: 01/03/2014 18:31    Scheduled Meds: . atorvastatin  10 mg Oral q1800  . buPROPion  300 mg Oral Daily  . calcium-vitamin D  1 tablet Oral Daily  . carvedilol  3.125 mg Oral BID WC  . clopidogrel  75 mg Oral Q breakfast  . enalapril  5 mg Oral Q breakfast  . enoxaparin (LOVENOX) injection  40 mg Subcutaneous Q24H  . gi cocktail  30 mL Oral Once  . multivitamin with minerals  2 tablet Oral Daily  . pantoprazole (PROTONIX) IV  40 mg Intravenous Q12H  . [START ON 01/05/2014] Vitamin D (Ergocalciferol)  50,000  Units Oral 3 times weekly   Continuous Infusions: . sodium chloride 75 mL/hr (01/03/14 2010)    Principal Problem:   Chest pain Active Problems:   CAD (coronary artery disease)   Hyperlipidemia   HTN (hypertension)   Anxiety   Depression    Time spent: 30 min    Hadriel Northup, [facility] Pager [phone]. If 7PM-7AM, please contact night-coverage at [email or web address], password [facility] 01/04/2014, 3:52 PM  LOS: 1 day

## 2014-01-04 NOTE — Progress Notes (Signed)
  Echocardiogram 2D Echocardiogram has been performed.  Janalee Dane M 01/04/2014, 10:22 AM

## 2014-01-05 ENCOUNTER — Observation Stay (HOSPITAL_COMMUNITY): Payer: Medicare Other

## 2014-01-05 DIAGNOSIS — E785 Hyperlipidemia, unspecified: Secondary | ICD-10-CM | POA: Diagnosis not present

## 2014-01-05 DIAGNOSIS — R7401 Elevation of levels of liver transaminase levels: Secondary | ICD-10-CM | POA: Diagnosis not present

## 2014-01-05 DIAGNOSIS — K831 Obstruction of bile duct: Secondary | ICD-10-CM | POA: Diagnosis not present

## 2014-01-05 DIAGNOSIS — F411 Generalized anxiety disorder: Secondary | ICD-10-CM | POA: Diagnosis not present

## 2014-01-05 DIAGNOSIS — R935 Abnormal findings on diagnostic imaging of other abdominal regions, including retroperitoneum: Secondary | ICD-10-CM | POA: Diagnosis not present

## 2014-01-05 DIAGNOSIS — R079 Chest pain, unspecified: Secondary | ICD-10-CM | POA: Diagnosis not present

## 2014-01-05 DIAGNOSIS — R0789 Other chest pain: Secondary | ICD-10-CM | POA: Diagnosis not present

## 2014-01-05 DIAGNOSIS — R7402 Elevation of levels of lactic acid dehydrogenase (LDH): Secondary | ICD-10-CM | POA: Diagnosis not present

## 2014-01-05 DIAGNOSIS — K838 Other specified diseases of biliary tract: Secondary | ICD-10-CM | POA: Diagnosis not present

## 2014-01-05 DIAGNOSIS — R1013 Epigastric pain: Secondary | ICD-10-CM | POA: Diagnosis not present

## 2014-01-05 DIAGNOSIS — R112 Nausea with vomiting, unspecified: Secondary | ICD-10-CM

## 2014-01-05 DIAGNOSIS — I2510 Atherosclerotic heart disease of native coronary artery without angina pectoris: Secondary | ICD-10-CM | POA: Diagnosis not present

## 2014-01-05 DIAGNOSIS — R932 Abnormal findings on diagnostic imaging of liver and biliary tract: Secondary | ICD-10-CM | POA: Diagnosis not present

## 2014-01-05 DIAGNOSIS — I10 Essential (primary) hypertension: Secondary | ICD-10-CM | POA: Diagnosis not present

## 2014-01-05 LAB — CBC
HCT: 36.1 % (ref 36.0–46.0)
Hemoglobin: 11.9 g/dL — ABNORMAL LOW (ref 12.0–15.0)
MCH: 30.5 pg (ref 26.0–34.0)
MCHC: 33 g/dL (ref 30.0–36.0)
MCV: 92.6 fL (ref 78.0–100.0)
Platelets: 218 10*3/uL (ref 150–400)
RBC: 3.9 MIL/uL (ref 3.87–5.11)
RDW: 13.1 % (ref 11.5–15.5)
WBC: 5.6 10*3/uL (ref 4.0–10.5)

## 2014-01-05 LAB — HEMOGLOBIN A1C
Hgb A1c MFr Bld: 5.8 % — ABNORMAL HIGH (ref <5.7)
Mean Plasma Glucose: 120 mg/dL — ABNORMAL HIGH (ref <117)

## 2014-01-05 LAB — COMPREHENSIVE METABOLIC PANEL
ALT: 183 U/L — ABNORMAL HIGH (ref 0–35)
AST: 116 U/L — ABNORMAL HIGH (ref 0–37)
Albumin: 3.4 g/dL — ABNORMAL LOW (ref 3.5–5.2)
Alkaline Phosphatase: 214 U/L — ABNORMAL HIGH (ref 39–117)
BUN: 6 mg/dL (ref 6–23)
CO2: 29 mEq/L (ref 19–32)
Calcium: 8.9 mg/dL (ref 8.4–10.5)
Chloride: 101 mEq/L (ref 96–112)
Creatinine, Ser: 0.84 mg/dL (ref 0.50–1.10)
GFR calc Af Amer: 82 mL/min — ABNORMAL LOW (ref >90)
GFR calc non Af Amer: 70 mL/min — ABNORMAL LOW (ref >90)
Glucose, Bld: 96 mg/dL (ref 70–99)
Potassium: 4.1 mEq/L (ref 3.7–5.3)
Sodium: 140 mEq/L (ref 137–147)
Total Bilirubin: 0.7 mg/dL (ref 0.3–1.2)
Total Protein: 6.5 g/dL (ref 6.0–8.3)

## 2014-01-05 LAB — HEPATITIS PANEL, ACUTE
HCV Ab: NEGATIVE
Hep A IgM: NONREACTIVE
Hep B C IgM: NONREACTIVE
Hepatitis B Surface Ag: NEGATIVE

## 2014-01-05 MED ORDER — NITROGLYCERIN 0.4 MG SL SUBL: 0.4000 mg | SUBLINGUAL_TABLET | SUBLINGUAL | Status: DC | PRN

## 2014-01-05 MED ORDER — GADOBENATE DIMEGLUMINE 529 MG/ML IV SOLN
12.0000 mL | Freq: Once | INTRAVENOUS | Status: AC | PRN
  Administered 2014-01-05: 12 mL via INTRAVENOUS

## 2014-01-05 NOTE — Consult Note (Addendum)
[facility] Consult Note  Referring Provider: No ref. provider found Primary Care Physician:  Jerlyn Ly, MD Primary Gastroenterologist:  Dr.  Laurel Dimmer Complaint: Chest pain HPI: Kimberly Stone is an 68 y.o. white female  who is admitted with chest pain and ruled out for an MI. She had pain radiating to her back. She has been seen by cardiology and further outpatient workup planned. She was found to have moderately elevated transaminases on admission which has fallen somewhat. Complicating matter s is that she has had chronically had elevated liver function tests and this is been followed by her primary care physician with no cause found. She has been on statin agents in the past but not last 3 years. She does drink moderate alcohol.  She had a CT angiogram which shows small pancreatic cyst and a borderline dilated common bile duct. She has a history of remote cholecystectomy many years ago. She also has a history of gastric bypass surgery. She had a followup MRCP that did not show a pancreatic cyst but did show a 1 cm dilated duct with no stone or tumor. This is thought to be most consistent with postcholecystectomy state but that an ampullary tumor or status and that is not visualized could not be excluded.  Past Medical History  Diagnosis Date  . Hypertension   . Coronary artery disease   . Depression   . GERD (gastroesophageal reflux disease)   . Fibromyalgia   . Arthritis   . Anemia     from bypass- iron and B12  . Acute MI 11/2004    NONSTEMI 2005 NOINTERVENTION PER NOTE   . Post-menopausal   . Scoliosis   . Hyperlipidemia   . Obesity     hx of bipass, now resolved.  borderline diabetes also resolved.  Marland Kitchen GIB (gastrointestinal bleeding)     while on celecoxib post bypass  . Anxiety     Past Surgical History  Procedure Laterality Date  . Gastric bypass  01/2004  . Cardiac catheterization      2005 Severely disease small optional diagonal vessel responsible far  wall motion abnormalities wise to small and diffusely diseased for consideration of interventio.  . Colonoscopy    . Laparoscopic cholecystectomy  1993  . Total vaginal hysterectomy  1973    secondary to DUB - Dr. Connye Burkitt and Dr. Tamala Julian  . Knee arthroscopy Right   . Cataract extraction      Medications Prior to Admission  Medication Sig Dispense Refill  . ALPRAZolam (XANAX) 0.5 MG tablet Take 0.5 mg by mouth daily as needed for anxiety.       Marland Kitchen amLODipine (NORVASC) 5 MG tablet Take 5 mg by mouth daily with breakfast.      . buPROPion (WELLBUTRIN XL) 300 MG 24 hr tablet Take 300 mg by mouth daily.      . calcium-vitamin D (OSCAL WITH D) 500-200 MG-UNIT per tablet Take 1 tablet by mouth daily.      . clopidogrel (PLAVIX) 75 MG tablet Take 75 mg by mouth daily with breakfast.      . enalapril (VASOTEC) 5 MG tablet Take 5 mg by mouth daily with breakfast.      . LORazepam (ATIVAN) 1 MG tablet Take 1 mg by mouth at bedtime as needed for anxiety.       . Multiple Vitamin (MULTIVITAMIN WITH MINERALS) TABS Take 2 tablets by mouth daily.      . pantoprazole (PROTONIX) 40 MG tablet Take 40 mg by  mouth daily with breakfast.      . Pitavastatin Calcium (LIVALO) 2 MG TABS Take 1 tablet by mouth 3 (three) times a week.      . Vitamin D, Ergocalciferol, (DRISDOL) 50000 UNITS CAPS Take 50,000 Units by mouth 3 (three) times a week.        Allergies:  Allergies  Allergen Reactions  . Aspirin     PT CANNOT TAKE DUE TO HX OF GASTRIC BYPASS SURGERY   . Erythromycin     Family History  Problem Relation Age of Onset  . Heart failure Mother   . Liver cancer Father   . Lung cancer Brother 66  . Cancer Brother   . Heart failure Maternal Grandmother   . Aneurysm Maternal Grandmother     ruptured abdominal aortic aneurysm    Social History:  reports that she quit smoking about 17 years ago. Her smoking use included Cigarettes. She has a 1.25 pack-year smoking history. She has never used smokeless  tobacco. She reports that she drinks alcohol. She reports that she does not use illicit drugs.  Review of Systems: negative except as above   Blood pressure 104/56, pulse 67, temperature 98.3 F (36.8 C), temperature source Oral, resp. rate 20, height _0  (1.575 m), weight 59.7 kg (131 lb 9.8 oz), SpO2 95.00%. Head: Normocephalic, without obvious abnormality, atraumatic Neck: no adenopathy, no carotid bruit, no JVD, supple, symmetrical, trachea midline and thyroid not enlarged, symmetric, no tenderness/mass/nodules Resp: clear to auscultation bilaterally Cardio: regular rate and rhythm, S1, S2 normal, no murmur, click, rub or gallop GI: Abdomen soft nondistended with normoactive bowel sounds. No hepatosplenomegaly mass or guarding Extremities: extremities normal, atraumatic, no cyanosis or edema  Results for orders placed during the hospital encounter of 01/03/14 (from the past 48 hour(s))  CBC WITH DIFFERENTIAL     Status: None   Collection Time    01/03/14  1:20 PM      Result Value Range   WBC 5.5  4.0 - 10.5 K/uL   RBC 3.99  3.87 - 5.11 MIL/uL   Hemoglobin 12.4  12.0 - 15.0 g/dL   HCT 36.2  36.0 - 46.0 %   MCV 90.7  78.0 - 100.0 fL   MCH 31.1  26.0 - 34.0 pg   MCHC 34.3  30.0 - 36.0 g/dL   RDW 12.8  11.5 - 15.5 %   Platelets 251  150 - 400 K/uL   Neutrophils Relative % 54  43 - 77 %   Neutro Abs 3.0  1.7 - 7.7 K/uL   Lymphocytes Relative 38  12 - 46 %   Lymphs Abs 2.1  0.7 - 4.0 K/uL   Monocytes Relative 6  3 - 12 %   Monocytes Absolute 0.3  0.1 - 1.0 K/uL   Eosinophils Relative 2  0 - 5 %   Eosinophils Absolute 0.1  0.0 - 0.7 K/uL   Basophils Relative 0  0 - 1 %   Basophils Absolute 0.0  0.0 - 0.1 K/uL  COMPREHENSIVE METABOLIC PANEL     Status: Abnormal   Collection Time    01/03/14  1:20 PM      Result Value Range   Sodium 140  137 - 147 mEq/L   Potassium 4.2  3.7 - 5.3 mEq/L   Chloride 103  96 - 112 mEq/L   CO2 25  19 - 32 mEq/L   Glucose, Bld 104 (*) 70 - 99  mg/dL   BUN 16  6 - 23 mg/dL   Creatinine, Ser 0.88  0.50 - 1.10 mg/dL   Calcium 9.2  8.4 - 10.5 mg/dL   Total Protein 7.0  6.0 - 8.3 g/dL   Albumin 3.9  3.5 - 5.2 g/dL   AST 164 (*) 0 - 37 U/L   ALT 60 (*) 0 - 35 U/L   Alkaline Phosphatase 148 (*) 39 - 117 U/L   Total Bilirubin 0.7  0.3 - 1.2 mg/dL   GFR calc non Af Amer 66 (*) >90 mL/min   GFR calc Af Amer 77 (*) >90 mL/min   Comment: (NOTE)     The eGFR has been calculated using the CKD EPI equation.     This calculation has not been validated in all clinical situations.     eGFR's persistently <90 mL/min signify possible Chronic Kidney     Disease.  POCT I-STAT TROPONIN I     Status: None   Collection Time    01/03/14  1:26 PM      Result Value Range   Troponin i, poc 0.00  0.00 - 0.08 ng/mL   Comment 3            Comment: Due to the release kinetics of cTnI,     a negative result within the first hours     of the onset of symptoms does not rule out     myocardial infarction with certainty.     If myocardial infarction is still suspected,     repeat the test at appropriate intervals.  D-DIMER, QUANTITATIVE     Status: None   Collection Time    01/03/14  2:30 PM      Result Value Range   D-Dimer, Quant 0.32  0.00 - 0.48 ug/mL-FEU   Comment:            AT THE INHOUSE ESTABLISHED CUTOFF     VALUE OF 0.48 ug/mL FEU,     THIS ASSAY HAS BEEN DOCUMENTED     IN THE LITERATURE TO HAVE     A SENSITIVITY AND NEGATIVE     PREDICTIVE VALUE OF AT LEAST     98 TO 99%.  THE TEST RESULT     SHOULD BE CORRELATED WITH     AN ASSESSMENT OF THE CLINICAL     PROBABILITY OF DVT / VTE.  LIPASE, BLOOD     Status: None   Collection Time    01/03/14  3:07 PM      Result Value Range   Lipase 33  11 - 59 U/L  PROTIME-INR     Status: None   Collection Time    01/03/14 11:25 PM      Result Value Range   Prothrombin Time 12.7  11.6 - 15.2 seconds   INR 0.97  0.00 - 1.49  APTT     Status: Abnormal   Collection Time    01/03/14 11:25 PM       Result Value Range   aPTT 182 (*) 24 - 37 seconds   Comment:            IF BASELINE aPTT IS ELEVATED,     SUGGEST PATIENT RISK ASSESSMENT     BE USED TO DETERMINE APPROPRIATE     ANTICOAGULANT THERAPY.  TROPONIN I     Status: None   Collection Time    01/03/14 11:25 PM      Result Value Range   Troponin I <0.30  <0.30 ng/mL  Comment:            Due to the release kinetics of cTnI,     a negative result within the first hours     of the onset of symptoms does not rule out     myocardial infarction with certainty.     If myocardial infarction is still suspected,     repeat the test at appropriate intervals.  HEMOGLOBIN A1C     Status: Abnormal   Collection Time    01/03/14 11:25 PM      Result Value Range   Hemoglobin A1C 5.8 (*) <5.7 %   Comment: (NOTE)                                                                               According to the ADA Clinical Practice Recommendations for 2011, when     HbA1c is used as a screening test:      >=6.5%   Diagnostic of Diabetes Mellitus               (if abnormal result is confirmed)     5.7-6.4%   Increased risk of developing Diabetes Mellitus     References:Diagnosis and Classification of Diabetes Mellitus,Diabetes     YCXK,4818,56(DJSHF 1):S62-S69 and Standards of Medical Care in             Diabetes - 2011,Diabetes Care,2011,34 (Suppl 1):S11-S61.   Mean Plasma Glucose 120 (*) <117 mg/dL   Comment: Performed at [facility]: None   Collection Time    01/04/14  2:35 AM      Result Value Range   Cholesterol 165  0 - 200 mg/dL   Triglycerides 86  <150 mg/dL   HDL 79  >39 mg/dL   Total CHOL/HDL Ratio 2.1     VLDL 17  0 - 40 mg/dL   LDL Cholesterol 69  0 - 99 mg/dL   Comment:            Total Cholesterol/HDL:CHD Risk     Coronary Heart Disease Risk Table                         Men   Women      1/2 Average Risk   3.4   3.3      Average Risk       5.0   4.4      2 X Average Risk   9.6    7.1      3 X Average Risk  23.4   11.0                Use the calculated Patient Ratio     above and the CHD Risk Table     to determine the patient's CHD Risk.                ATP III CLASSIFICATION (LDL):      <100     mg/dL   Optimal      100-129  mg/dL   Near or Above  Optimal      130-159  mg/dL   Borderline      160-189  mg/dL   High      >190     mg/dL   Very High     Performed at [facility]  CBC     Status: Abnormal   Collection Time    01/04/14  2:35 AM      Result Value Range   WBC 5.3  4.0 - 10.5 K/uL   RBC 3.84 (*) 3.87 - 5.11 MIL/uL   Hemoglobin 11.7 (*) 12.0 - 15.0 g/dL   HCT 34.5 (*) 36.0 - 46.0 %   MCV 89.8  78.0 - 100.0 fL   MCH 30.5  26.0 - 34.0 pg   MCHC 33.9  30.0 - 36.0 g/dL   RDW 12.9  11.5 - 15.5 %   Platelets 231  150 - 400 K/uL  BASIC METABOLIC PANEL     Status: Abnormal   Collection Time    01/04/14  2:35 AM      Result Value Range   Sodium 138  137 - 147 mEq/L   Potassium 3.9  3.7 - 5.3 mEq/L   Chloride 101  96 - 112 mEq/L   CO2 24  19 - 32 mEq/L   Glucose, Bld 108 (*) 70 - 99 mg/dL   BUN 10  6 - 23 mg/dL   Creatinine, Ser 0.70  0.50 - 1.10 mg/dL   Calcium 8.9  8.4 - 10.5 mg/dL   GFR calc non Af Amer 88 (*) >90 mL/min   GFR calc Af Amer >90  >90 mL/min   Comment: (NOTE)     The eGFR has been calculated using the CKD EPI equation.     This calculation has not been validated in all clinical situations.     eGFR's persistently <90 mL/min signify possible Chronic Kidney     Disease.  HEPARIN LEVEL (UNFRACTIONATED)     Status: None   Collection Time    01/04/14  2:35 AM      Result Value Range   Heparin Unfractionated 0.42  0.30 - 0.70 IU/mL   Comment:            IF HEPARIN RESULTS ARE BELOW     EXPECTED VALUES, AND PATIENT     DOSAGE HAS BEEN CONFIRMED,     SUGGEST FOLLOW UP TESTING     OF ANTITHROMBIN III LEVELS.  TROPONIN I     Status: None   Collection Time    01/04/14  6:14 AM      Result Value  Range   Troponin I <0.30  <0.30 ng/mL   Comment:            Due to the release kinetics of cTnI,     a negative result within the first hours     of the onset of symptoms does not rule out     myocardial infarction with certainty.     If myocardial infarction is still suspected,     repeat the test at appropriate intervals.  TROPONIN I     Status: None   Collection Time    01/04/14 11:58 AM      Result Value Range   Troponin I <0.30  <0.30 ng/mL   Comment:            Due to the release kinetics of cTnI,     a negative result within the first hours     of  the onset of symptoms does not rule out     myocardial infarction with certainty.     If myocardial infarction is still suspected,     repeat the test at appropriate intervals.  HEPATIC FUNCTION PANEL     Status: Abnormal   Collection Time    01/04/14 11:58 AM      Result Value Range   Total Protein 6.5  6.0 - 8.3 g/dL   Albumin 3.4 (*) 3.5 - 5.2 g/dL   AST 305 (*) 0 - 37 U/L   ALT 275 (*) 0 - 35 U/L   Alkaline Phosphatase 240 (*) 39 - 117 U/L   Total Bilirubin 0.8  0.3 - 1.2 mg/dL   Bilirubin, Direct <0.2  0.0 - 0.3 mg/dL   Indirect Bilirubin NOT CALCULATED  0.3 - 0.9 mg/dL  LIPASE, BLOOD     Status: None   Collection Time    01/04/14 11:58 AM      Result Value Range   Lipase 27  11 - 59 U/L  ACETAMINOPHEN LEVEL     Status: None   Collection Time    01/04/14  4:42 PM      Result Value Range   Acetaminophen (Tylenol), Serum <15.0  10 - 30 ug/mL   Comment:            THERAPEUTIC CONCENTRATIONS VARY     SIGNIFICANTLY. A RANGE OF 10-30     ug/mL MAY BE AN EFFECTIVE     CONCENTRATION FOR MANY PATIENTS.     HOWEVER, SOME ARE BEST TREATED     AT CONCENTRATIONS OUTSIDE THIS     RANGE.     ACETAMINOPHEN CONCENTRATIONS     >150 ug/mL AT 4 HOURS AFTER     INGESTION AND >50 ug/mL AT 12     HOURS AFTER INGESTION ARE     OFTEN ASSOCIATED WITH TOXIC     REACTIONS.  COMPREHENSIVE METABOLIC PANEL     Status: Abnormal    Collection Time    01/05/14  8:25 AM      Result Value Range   Sodium 140  137 - 147 mEq/L   Potassium 4.1  3.7 - 5.3 mEq/L   Chloride 101  96 - 112 mEq/L   CO2 29  19 - 32 mEq/L   Glucose, Bld 96  70 - 99 mg/dL   BUN 6  6 - 23 mg/dL   Creatinine, Ser 0.84  0.50 - 1.10 mg/dL   Calcium 8.9  8.4 - 10.5 mg/dL   Total Protein 6.5  6.0 - 8.3 g/dL   Albumin 3.4 (*) 3.5 - 5.2 g/dL   AST 116 (*) 0 - 37 U/L   ALT 183 (*) 0 - 35 U/L   Alkaline Phosphatase 214 (*) 39 - 117 U/L   Total Bilirubin 0.7  0.3 - 1.2 mg/dL   GFR calc non Af Amer 70 (*) >90 mL/min   GFR calc Af Amer 82 (*) >90 mL/min   Comment: (NOTE)     The eGFR has been calculated using the CKD EPI equation.     This calculation has not been validated in all clinical situations.     eGFR's persistently <90 mL/min signify possible Chronic Kidney     Disease.  CBC     Status: Abnormal   Collection Time    01/05/14  8:25 AM      Result Value Range   WBC 5.6  4.0 - 10.5 K/uL   RBC 3.90  3.87 - 5.11 MIL/uL   Hemoglobin 11.9 (*) 12.0 - 15.0 g/dL   HCT 36.1  36.0 - 46.0 %   MCV 92.6  78.0 - 100.0 fL   MCH 30.5  26.0 - 34.0 pg   MCHC 33.0  30.0 - 36.0 g/dL   RDW 13.1  11.5 - 15.5 %   Platelets 218  150 - 400 K/uL   Dg Chest 2 View  01/03/2014   CLINICAL DATA:  Epigastric and mid chest pain with vomiting  EXAM: CHEST  2 VIEW  COMPARISON:  PA and lateral chest x-ray dated September 17, 2013  FINDINGS: The lungs are mildly hyperinflated. There is no focal infiltrate. There is no pleural effusion or pneumothorax. The cardiac silhouette is normal in size. The pulmonary vascularity is not engorged. The mediastinum is normal in width. There is mild tortuosity of the descending thoracic aorta. There is curvature of the lower thoracic spine in an S-shaped configuration.  IMPRESSION: There is hyperinflation consistent with COPD or reactive airway disease. There is no evidence of pneumonia nor CHF nor other acute cardiopulmonary abnormality.    Electronically Signed   By: David  Martinique   On: 01/03/2014 14:01   Mr 3d Recon At Scanner  01/05/2014   CLINICAL DATA:  Elevated liver function tests, transaminitis, intrahepatic ductal dilatation  EXAM: MRI ABDOMEN WITHOUT AND WITH CONTRAST (INCLUDING MRCP)  TECHNIQUE: Multiplanar multisequence MR imaging of the abdomen was performed both before and after the administration of intravenous contrast. Heavily T2-weighted images of the biliary and pancreatic ducts were obtained, and three-dimensional MRCP images were rendered by post processing.  CONTRAST:  60m MULTIHANCE GADOBENATE DIMEGLUMINE 529 MG/ML IV SOLN  COMPARISON:  CT 01/03/2014  FINDINGS: Mild prominence of the intrahepatic ducts and fusiform prominence of the common duct measuring 1 cm maximally at its mid portion noted. There is mild prominence of the ampulla, for example image 40 series 3. No pancreatic ductal dilatation or peripancreatic fluid collection is identified. Gallbladder surgically absent. Evidence of gastric bypass not as well seen as on the prior exam. Liver, spleen, adrenal glands, pancreas, and right kidney are unremarkable. Two 3 mm cortical cysts are incidentally noted at the mid left kidney.  At heavily T2 weighted images, there is no intraductal filling defect identified. Tapering of the common duct is noted at the level of the ampulla without filling defect, for example image 66 series 4. No ascites.  After administration of contrast, no enhancing mass is identified.  IMPRESSION: Mild diffuse common duct dilatation measuring 1 cm maximally, with tapering at the ampulla and mild subjective prominence of the ampulla. Findings are most consistent with post cholecystectomy ductal prominence, although they could also reflect edema at the ampulla from a nonvisualized stone with subsequent mild ductal obstruction, normal variant, or a small ampullary adenoma as the most common mass lesion seen in this location anatomically.  ERCP/endoscopy could be helpful for direct ampullary visualization if symptoms continue.   Electronically Signed   By: GConchita ParisM.D.   On: 01/05/2014 09:15   Mr Abd W/wo Cm/mrcp  01/05/2014   CLINICAL DATA:  Elevated liver function tests, transaminitis, intrahepatic ductal dilatation  EXAM: MRI ABDOMEN WITHOUT AND WITH CONTRAST (INCLUDING MRCP)  TECHNIQUE: Multiplanar multisequence MR imaging of the abdomen was performed both before and after the administration of intravenous contrast. Heavily T2-weighted images of the biliary and pancreatic ducts were obtained, and three-dimensional MRCP images were rendered by post processing.  CONTRAST:  144mMULTIHANCE GADOBENATE DIMEGLUMINE  529 MG/ML IV SOLN  COMPARISON:  CT 01/03/2014  FINDINGS: Mild prominence of the intrahepatic ducts and fusiform prominence of the common duct measuring 1 cm maximally at its mid portion noted. There is mild prominence of the ampulla, for example image 40 series 3. No pancreatic ductal dilatation or peripancreatic fluid collection is identified. Gallbladder surgically absent. Evidence of gastric bypass not as well seen as on the prior exam. Liver, spleen, adrenal glands, pancreas, and right kidney are unremarkable. Two 3 mm cortical cysts are incidentally noted at the mid left kidney.  At heavily T2 weighted images, there is no intraductal filling defect identified. Tapering of the common duct is noted at the level of the ampulla without filling defect, for example image 66 series 4. No ascites.  After administration of contrast, no enhancing mass is identified.  IMPRESSION: Mild diffuse common duct dilatation measuring 1 cm maximally, with tapering at the ampulla and mild subjective prominence of the ampulla. Findings are most consistent with post cholecystectomy ductal prominence, although they could also reflect edema at the ampulla from a nonvisualized stone with subsequent mild ductal obstruction, normal variant, or a small  ampullary adenoma as the most common mass lesion seen in this location anatomically. ERCP/endoscopy could be helpful for direct ampullary visualization if symptoms continue.   Electronically Signed   By: Conchita Paris M.D.   On: 01/05/2014 09:15   Ct Angio Chest Aortic Dissect W &/or W/o  01/03/2014   CLINICAL DATA:  Chest pain which radiates to back. Evaluate for dissection.  EXAM: CT ANGIOGRAPHY CHEST, ABDOMEN AND PELVIS  TECHNIQUE: Multidetector CT imaging through the chest, abdomen and pelvis was performed using the standard protocol during bolus administration of intravenous contrast. Multiplanar reconstructed images including MIPs were obtained and reviewed to evaluate the vascular anatomy.  CONTRAST:  182m OMNIPAQUE IOHEXOL 350 MG/ML SOLN  COMPARISON:  [place] 02/23/[place]; CT-ABD-PELV dated 10/24/2006  FINDINGS: CTA CHEST FINDINGS  Non IV contrast images demonstrate no intramural hematoma in the thoracic aorta. The contrast-enhanced images demonstrate no evidence of dissection or aneurysm of the thoracic aorta. The great vessels are normal. There is minimal dilatation of the ascending aorta measuring 32 mm x 31 mm. No evidence of pulmonary embolism. No pericardial fluid.  No axillary supraclavicular lymphadenopathy. No mediastinal hilar lymphadenopathy. Esophagus is normal.  Review of the lung parenchyma demonstrates mild scarring in the inferior lingula. No pleural fluid, pneumothorax, or pneumonia.  Review of the MIP images confirms the above findings.  CTA ABDOMEN AND PELVIS FINDINGS  The abdominal aorta is normal in caliber without evidence of aneurysm or dissection. There is minimal intimal calcification. The SMA is patent. The splenic artery extends from the proper hepatic artery. IMA is patent. No aneurysm of the iliac arteries. There are bilateral single renal arteries.  No focal hepatic lesion. Post cholecystectomy. The common bile duct is dilated with mild intrahepatic biliary  duct dilatation. The common bile duct measures 7 mm. The common hepatic duct measures 11 mm. There is no evidence of pancreatic duct dilatation. Small cystic lesion in the inferior aspect of the pancreatic head and image 6 mm (image 122, series 5).  The spleen, adrenal glands, and kidneys are normal.  The stomach demonstrates a post gastric bypass anatomy. No evidence obstruction. No acute findings of the small bowel or colon. A single loop of dilated small bowel in the mid pelvis with loops measuring 3.2 cm (coronal image 6).  No retroperitoneal periportal lymphadenopathy.  Post hysterectomy  anatomy. The bladder is normal. No aggressive osseous lesion. Rounded lucent lesion in the L4 vertebral body likely represents a benign hemangioma.  Review of the MIP images confirms the above findings.  IMPRESSION: 1. No evidence of aortic dissection or aneurysm. 2. No evidence of abdominal aortic dissection or aneurysm. 3. Progressive intra and extrahepatic biliary duct dilatation may relate to prior cholecystectomy. Recommend correlation with liver function tests / bilirubin. If elevated considered MRCP excluded distal lesion. 4. Small cystic lesion in the head of the pancreas. Recommend follow-up MRI pancreatic protocol in 12 months. This recommendation follows ACR consensus guidelines: Managing Incidental Findings on Abdominal CT: White Paper of the ACR Incidental Findings Committee. J Am Coll Radiol 2010;7:754-773 5. Focally dilated loop of small bowel within the pelvis. This likely represents a focal ileus. No evidence of obstruction.   Electronically Signed   By: Suzy Bouchard M.D.   On: 01/03/2014 18:31   Ct Angio Abd/pel W/ And/or W/o  01/03/2014   CLINICAL DATA:  Chest pain which radiates to back. Evaluate for dissection.  EXAM: CT ANGIOGRAPHY CHEST, ABDOMEN AND PELVIS  TECHNIQUE: Multidetector CT imaging through the chest, abdomen and pelvis was performed using the standard protocol during bolus administration  of intravenous contrast. Multiplanar reconstructed images including MIPs were obtained and reviewed to evaluate the vascular anatomy.  CONTRAST:  155m OMNIPAQUE IOHEXOL 350 MG/ML SOLN  COMPARISON:  [place] dated 02/23/2009; CT-ABD-PELV dated 10/24/2006  FINDINGS: CTA CHEST FINDINGS  Non IV contrast images demonstrate no intramural hematoma in the thoracic aorta. The contrast-enhanced images demonstrate no evidence of dissection or aneurysm of the thoracic aorta. The great vessels are normal. There is minimal dilatation of the ascending aorta measuring 32 mm x 31 mm. No evidence of pulmonary embolism. No pericardial fluid.  No axillary supraclavicular lymphadenopathy. No mediastinal hilar lymphadenopathy. Esophagus is normal.  Review of the lung parenchyma demonstrates mild scarring in the inferior lingula. No pleural fluid, pneumothorax, or pneumonia.  Review of the MIP images confirms the above findings.  CTA ABDOMEN AND PELVIS FINDINGS  The abdominal aorta is normal in caliber without evidence of aneurysm or dissection. There is minimal intimal calcification. The SMA is patent. The splenic artery extends from the proper hepatic artery. IMA is patent. No aneurysm of the iliac arteries. There are bilateral single renal arteries.  No focal hepatic lesion. Post cholecystectomy. The common bile duct is dilated with mild intrahepatic biliary duct dilatation. The common bile duct measures 7 mm. The common hepatic duct measures 11 mm. There is no evidence of pancreatic duct dilatation. Small cystic lesion in the inferior aspect of the pancreatic head and image 6 mm (image 122, series 5).  The spleen, adrenal glands, and kidneys are normal.  The stomach demonstrates a post gastric bypass anatomy. No evidence obstruction. No acute findings of the small bowel or colon. A single loop of dilated small bowel in the mid pelvis with loops measuring 3.2 cm (coronal image 6).  No retroperitoneal periportal  lymphadenopathy.  Post hysterectomy anatomy. The bladder is normal. No aggressive osseous lesion. Rounded lucent lesion in the L4 vertebral body likely represents a benign hemangioma.  Review of the MIP images confirms the above findings.  IMPRESSION: 1. No evidence of aortic dissection or aneurysm. 2. No evidence of abdominal aortic dissection or aneurysm. 3. Progressive intra and extrahepatic biliary duct dilatation may relate to prior cholecystectomy. Recommend correlation with liver function tests / bilirubin. If elevated considered MRCP excluded distal lesion. 4. Small  cystic lesion in the head of the pancreas. Recommend follow-up MRI pancreatic protocol in 12 months. This recommendation follows ACR consensus guidelines: Managing Incidental Findings on Abdominal CT: White Paper of the ACR Incidental Findings Committee. J Am Coll Radiol 2010;7:754-773 5. Focally dilated loop of small bowel within the pelvis. This likely represents a focal ileus. No evidence of obstruction.   Electronically Signed   By: Suzy Bouchard M.D.   On: 01/03/2014 18:31    Assessment: 1. Chest pain, etiology unclear, improved at this point with negative cardiac workup so far 2. Elevated liver function tests, apparently chronic according to the patient, cannot verify 3. Dilated common bile duct in the setting of chronic postcholecystectomy state, no definite stone or tumor seen on CT and MRI imaging studies 4. History of gastric bypass Plan:  1. Overall it would appear somewhat unlikely that her current symptoms or ductal dilatation are related to an obstructive bile duct process although  liver function  tests are elevated without other explanation other than she has been told they have been chronically elevated. Alcohol could be a factor conceivably. Hepatitis panel has been ordered and results are pending 2. ERCP would be the next step in ruling out a biliary process but I am not convinced that there is a high likelihood of  any pathology to be found given the results of the previous imaging studies. Also gastric bypass surgery would render the ERCP difficult if not impossible. I would favor expectant management and outpatient followup of liver function tests and try to obtain previous liver function tests. Might consider outpatient endoscopy to see if depending on what type of bypass she had the papilla can even be reached. Endoscopic ultrasound may also be helpful which could be performed as an outpatient. Will arrange outpatient followup with me and is okay for discharge from GI standpoint. Larren Copes C 01/05/2014, 10:06 AM

## 2014-01-05 NOTE — Discharge Summary (Signed)
Physician Discharge Summary  Kimberly Stone [id number] DOB: [birth year] DOA: 01/03/2014  PCP: Jerlyn Ly, MD  Admit date: 01/03/2014 Discharge date: 01/05/2014  Recommendations for Outpatient Follow-up:  1. Follow up with gastroenterology within 3 weeks.  F/u pancreatic cyst, dilation of bile duct, transaminitis. 2. F/u with cardiology within 1-2 weeks for outpatient stress test 3. PCP in 1-2 weeks.  Please follow up pending hepatitis panel and repeat liver function tests at next appointment please.    Discharge Diagnoses:  Principal Problem:   Atypical chest pain Active Problems:   Hyperlipidemia   HTN (hypertension)   Anxiety   Depression   Transaminitis   Pancreatic cyst   Intrahepatic bile duct dilation   Extrahepatic obstructive biliary disease   Nausea and vomiting   Discharge Condition: stable, improved  Diet recommendation: healthy heart  Wt Readings from Last 3 Encounters:  01/03/14 59.7 kg (131 lb 9.8 oz)  08/19/13 62.914 kg (138 lb 11.2 oz)  07/09/13 63.504 kg (140 lb)    History of present illness:  The patient is a 68 y.o. year-old female with history of CAD (NSTEMI 2005 found to have severe disease in ramus intermedius too diffusely diseased to allow PCI), HTN, HLD, GERD, previous obesity and pre-diabetes s/p gastric bypass surgery who presents with chest pain. The patient was last at their baseline health until two days ago. She was on vacation in [place] when she developed watery diarrhea which persisted for 2 days. She flew back from [place] last night. Today, she woke up feeling well and she took some Imodium and has not had any diarrhea since. She was at the mall walking with her family when she developed some numbness and pain of her left thumb and radial wrist. Immediately thereafter she developed a tightening across her chest which was about a 4/10, however 8 quickly progressed to severe 10 out of 10 chest tightness with associated shortness of breath. Pain  radiates to her back and her left neck/head. Her husband drove her immediately to the emergency department. She developed nausea and gagging, but denies lightheadedness and diaphoresis. She has received nitroglycerin paste which has alleviated some of her pain.   In the ER, initial troponin negative and ECG with new T-wave inversion in aVL, otherwise no T-wave inversions or ST-segment elevations ( I think there may have been lead reversal of V1 and V2). CXR unremarkable, labs notable for mild transaminitis which is chronic. Lipase negative. D-dimer negative.  Hospital Course:   Chest pain with radiation to back.  Unclear etiology and was concerning for unstable angina.  Pancreatitis was less likely as lipase was negative x 2 and her CT angio demonstrated no peripancreatic fat stranding.  CT angio was negative for dissection.  She may have had some gastritis, possibly viral given her recent diarrhea.  She was started on PPI and GI cocktail, however, it is unclear if this helped or if her symptoms resolved spontaneously.  NTG seemed to help relieve her symptoms, which could suggest angina or esophageal spasm.  D-dimer was negative.  Given initial concern for unstable angina, she was initially given asa and continued on plavix.  She continued statin and was started on beta blocker.  She was heparinized and seen by cardiology.  Given the atypical nature of her chest pain and her 3 negative troponins, she was felt to be safe to follow up in cardiology clinic for further evaluation.  Her telemetry demonstrated NSR in the 60s predominantly.    - A1c 5.8 -  Lipid panel LDL 69, HDL 79   Nausea and vomiting, may have been due to gastritis, hepatitis.  Given relative severity of nausea compared to chest pain, feel that this was not related to angina.  She was given zofran/phenergan, protonix, GI cocktail, and IVF and her symptoms gradually resolved.    Transaminitis, mild initially and trended up on day two.  She  has had problems with transaminitis previously which were attributed to statins and iatrogenic hemochromatosis.  Her statin was discontinued.  Tylenol level was negative.  Acute hepatitis panel is pending at the time of discharge and should be followed up, particularly given her recent travel and increased risk of Hepatitis A.  She also had some ductal dilation on CT scan intra and extrahepatic ductal dilation.  MRCP confirmed dilation with some "prominence at the ampulla."  Gastroenterology was consulted who recommended against further evaluation at this time because the patient's nausea and vomiting and transaminitis were resolving spontaneously.  They will arrange for outpatient follow up with Dr. Amedeo Plenty, GI.      HTN/HLD, blood pressure mildly elevated, likely secondary to pain and quickly returned to normal limits  For possible gastritis/PUD.  Will recommend continuing PPI.    Pancreatic cyst, seen on CT but not commented on on MRCP.  May have been spurious.  Defer to GI for follow up.    Depression/anxiety, Continue xanax and bupropion   Consultants:  Cardiology, Dr. Sallyanne Kuster GI, Dr. Amedeo Plenty Procedures:  CT angio C/A/P  CXR  ECHO MRCP Antibiotics:  None    Discharge Exam: Filed Vitals:   01/05/14 0559  BP: 104/56  Pulse: 67  Temp: 98.3 F (36.8 C)  Resp: 20   Filed Vitals:   01/04/14 0555 01/04/14 1429 01/04/14 2058 01/05/14 0559  BP: 140/66 144/76 105/61 104/56  Pulse: 75 69 62 67  Temp: 97.8 F (36.6 C) 97.9 F (36.6 C) 97.9 F (36.6 C) 98.3 F (36.8 C)  TempSrc: Oral Oral Oral Oral  Resp: 20 18 20 20   Height:      Weight:      SpO2: 99% 100% 96% 95%    General: CF, No acute distress  HEENT: NCAT, MMM  Cardiovascular: RRR, nl S1, S2 no mrg, 2+ pulses, warm extremities  Respiratory: CTAB, no increased WOB Abdomen: NABS, soft, NT/ND MSK: Normal tone and bulk, no LEE  Neuro: Grossly intact   Discharge Instructions      Discharge Orders   Future  Appointments Provider Department Dept Phone   05/14/2014 10:30 AM [place] [facility], [place] [phone]   Future Orders Complete By Expires   Call MD for:  difficulty breathing, headache or visual disturbances  As directed    Call MD for:  extreme fatigue  As directed    Call MD for:  hives  As directed    Call MD for:  persistant dizziness or light-headedness  As directed    Call MD for:  persistant nausea and vomiting  As directed    Call MD for:  severe uncontrolled pain  As directed    Call MD for:  temperature >100.4  As directed    Diet - low sodium heart healthy  As directed    Discharge instructions  As directed    Comments:     You were hospitalized with chest pain.  We are still investigating what could have caused your pain, so please follow up with cardiology within 1-2 weeks for outpatient stress test and appointment with  Dr. Sallyanne Kuster.  You were tested for heart attack and your blood tests were normal as was your heart rhythm and rate.  You did NOT have a blood clot or tear of the aorta (large blood vessel in the chest), nor did you have pancreatitis.  You did have some elevated liver function tests, so please stop taking your cholesterol medication for now.  Continue your plavix and amlodipine as before, however.  I have given you a prescription for nitroglycerin to use as needed.  If you develop severe chest pain with shortness of breath again, please seek immediate medical attention.  Follow up with gastroenterology in 3 weeks to talk about your liver function tests and the dilated bile duct seen on your CT scan and MRI.  There was a pancreatic cyst seen on your CT scan but not on your MRCP.  The gastroenterologists will follow this.  Finally, please follow up with your primary care doctor regarding the final results of your hepatitis panel which is still pending at the time of discharge.   Increase activity slowly  As directed        Medication List     STOP taking these medications       LIVALO 2 MG Tabs  Generic drug:  Pitavastatin Calcium      TAKE these medications       ALPRAZolam 0.5 MG tablet  Commonly known as:  XANAX  Take 0.5 mg by mouth daily as needed for anxiety.     amLODipine 5 MG tablet  Commonly known as:  NORVASC  Take 5 mg by mouth daily with breakfast.     buPROPion 300 MG 24 hr tablet  Commonly known as:  WELLBUTRIN XL  Take 300 mg by mouth daily.     calcium-vitamin D 500-200 MG-UNIT per tablet  Commonly known as:  OSCAL WITH D  Take 1 tablet by mouth daily.     clopidogrel 75 MG tablet  Commonly known as:  PLAVIX  Take 75 mg by mouth daily with breakfast.     enalapril 5 MG tablet  Commonly known as:  VASOTEC  Take 5 mg by mouth daily with breakfast.     LORazepam 1 MG tablet  Commonly known as:  ATIVAN  Take 1 mg by mouth at bedtime as needed for anxiety.     multivitamin with minerals Tabs tablet  Take 2 tablets by mouth daily.     nitroGLYCERIN 0.4 MG SL tablet  Commonly known as:  NITROSTAT  Place 1 tablet (0.4 mg total) under the tongue every 5 (five) minutes as needed for chest pain.     pantoprazole 40 MG tablet  Commonly known as:  PROTONIX  Take 40 mg by mouth daily with breakfast.     Vitamin D (Ergocalciferol) 50000 UNITS Caps capsule  Commonly known as:  DRISDOL  Take 50,000 Units by mouth 3 (three) times a week.       Follow-up Information   Follow up with HAYES,JOHN C, MD In 3 weeks.   Specialty:  Gastroenterology   Contact information:   [id number] [place] [address] [phone]       Follow up with Jerlyn Ly, MD. Schedule an appointment as soon as possible for a visit in 2 weeks.   Specialty:  Internal Medicine   Contact information:   [place] [address] [phone]       Follow up with Sanda Klein, MD. Schedule an appointment as soon  as possible for a visit in 2 weeks.   Specialty:  Cardiology   Contact  information:   [address] [place] [address] [phone]        The results of significant diagnostics from this hospitalization (including imaging, microbiology, ancillary and laboratory) are listed below for reference.    Significant Diagnostic Studies: Dg Chest 2 View  01/03/2014   CLINICAL DATA:  Epigastric and mid chest pain with vomiting  EXAM: CHEST  2 VIEW  COMPARISON:  PA and lateral chest x-ray dated September 17, 2013  FINDINGS: The lungs are mildly hyperinflated. There is no focal infiltrate. There is no pleural effusion or pneumothorax. The cardiac silhouette is normal in size. The pulmonary vascularity is not engorged. The mediastinum is normal in width. There is mild tortuosity of the descending thoracic aorta. There is curvature of the lower thoracic spine in an S-shaped configuration.  IMPRESSION: There is hyperinflation consistent with COPD or reactive airway disease. There is no evidence of pneumonia nor CHF nor other acute cardiopulmonary abnormality.   Electronically Signed   By: David  Martinique   On: 01/03/2014 14:01   Mr 3d Recon At Scanner  01/05/2014   CLINICAL DATA:  Elevated liver function tests, transaminitis, intrahepatic ductal dilatation  EXAM: MRI ABDOMEN WITHOUT AND WITH CONTRAST (INCLUDING MRCP)  TECHNIQUE: Multiplanar multisequence MR imaging of the abdomen was performed both before and after the administration of intravenous contrast. Heavily T2-weighted images of the biliary and pancreatic ducts were obtained, and three-dimensional MRCP images were rendered by post processing.  CONTRAST:  70mL MULTIHANCE GADOBENATE DIMEGLUMINE 529 MG/ML IV SOLN  COMPARISON:  CT 01/03/2014  FINDINGS: Mild prominence of the intrahepatic ducts and fusiform prominence of the common duct measuring 1 cm maximally at its mid portion noted. There is mild prominence of the ampulla, for example image 40 series 3. No pancreatic ductal dilatation or peripancreatic fluid  collection is identified. Gallbladder surgically absent. Evidence of gastric bypass not as well seen as on the prior exam. Liver, spleen, adrenal glands, pancreas, and right kidney are unremarkable. Two 3 mm cortical cysts are incidentally noted at the mid left kidney.  At heavily T2 weighted images, there is no intraductal filling defect identified. Tapering of the common duct is noted at the level of the ampulla without filling defect, for example image 66 series 4. No ascites.  After administration of contrast, no enhancing mass is identified.  IMPRESSION: Mild diffuse common duct dilatation measuring 1 cm maximally, with tapering at the ampulla and mild subjective prominence of the ampulla. Findings are most consistent with post cholecystectomy ductal prominence, although they could also reflect edema at the ampulla from a nonvisualized stone with subsequent mild ductal obstruction, normal variant, or a small ampullary adenoma as the most common mass lesion seen in this location anatomically. ERCP/endoscopy could be helpful for direct ampullary visualization if symptoms continue.   Electronically Signed   By: Conchita [place] M.D.   On: 01/05/2014 09:15   Mr Abd W/wo Cm/mrcp  01/05/2014   CLINICAL DATA:  Elevated liver function tests, transaminitis, intrahepatic ductal dilatation  EXAM: MRI ABDOMEN WITHOUT AND WITH CONTRAST (INCLUDING MRCP)  TECHNIQUE: Multiplanar multisequence MR imaging of the abdomen was performed both before and after the administration of intravenous contrast. Heavily T2-weighted images of the biliary and pancreatic ducts were obtained, and three-dimensional MRCP images were rendered by post processing.  CONTRAST:  68mL MULTIHANCE GADOBENATE DIMEGLUMINE 529 MG/ML IV SOLN  COMPARISON:  CT 01/03/2014  FINDINGS: Mild prominence of the intrahepatic ducts and fusiform prominence of the common duct measuring 1 cm maximally at its mid portion noted. There is mild prominence of the ampulla, for  example image 40 series 3. No pancreatic ductal dilatation or peripancreatic fluid collection is identified. Gallbladder surgically absent. Evidence of gastric bypass not as well seen as on the prior exam. Liver, spleen, adrenal glands, pancreas, and right kidney are unremarkable. Two 3 mm cortical cysts are incidentally noted at the mid left kidney.  At heavily T2 weighted images, there is no intraductal filling defect identified. Tapering of the common duct is noted at the level of the ampulla without filling defect, for example image 66 series 4. No ascites.  After administration of contrast, no enhancing mass is identified.  IMPRESSION: Mild diffuse common duct dilatation measuring 1 cm maximally, with tapering at the ampulla and mild subjective prominence of the ampulla. Findings are most consistent with post cholecystectomy ductal prominence, although they could also reflect edema at the ampulla from a nonvisualized stone with subsequent mild ductal obstruction, normal variant, or a small ampullary adenoma as the most common mass lesion seen in this location anatomically. ERCP/endoscopy could be helpful for direct ampullary visualization if symptoms continue.   Electronically Signed   By: Conchita [place] M.D.   On: 01/05/2014 09:15   Ct Angio Chest Aortic Dissect W &/or W/o  01/03/2014   CLINICAL DATA:  Chest pain which radiates to back. Evaluate for dissection.  EXAM: CT ANGIOGRAPHY CHEST, ABDOMEN AND PELVIS  TECHNIQUE: Multidetector CT imaging through the chest, abdomen and pelvis was performed using the standard protocol during bolus administration of intravenous contrast. Multiplanar reconstructed images including MIPs were obtained and reviewed to evaluate the vascular anatomy.  CONTRAST:  175mL OMNIPAQUE IOHEXOL 350 MG/ML SOLN  COMPARISON:  US ABDOMEN COMPLETE dated 02/23/2009; CT-ABD-PELV dated 10/24/2006  FINDINGS: CTA CHEST FINDINGS  Non IV contrast images demonstrate no intramural hematoma in the  thoracic aorta. The contrast-enhanced images demonstrate no evidence of dissection or aneurysm of the thoracic aorta. The great vessels are normal. There is minimal dilatation of the ascending aorta measuring 32 mm x 31 mm. No evidence of pulmonary embolism. No pericardial fluid.  No axillary supraclavicular lymphadenopathy. No mediastinal hilar lymphadenopathy. Esophagus is normal.  Review of the lung parenchyma demonstrates mild scarring in the inferior lingula. No pleural fluid, pneumothorax, or pneumonia.  Review of the MIP images confirms the above findings.  CTA ABDOMEN AND PELVIS FINDINGS  The abdominal aorta is normal in caliber without evidence of aneurysm or dissection. There is minimal intimal calcification. The SMA is patent. The splenic artery extends from the proper hepatic artery. IMA is patent. No aneurysm of the iliac arteries. There are bilateral single renal arteries.  No focal hepatic lesion. Post cholecystectomy. The common bile duct is dilated with mild intrahepatic biliary duct dilatation. The common bile duct measures 7 mm. The common hepatic duct measures 11 mm. There is no evidence of pancreatic duct dilatation. Small cystic lesion in the inferior aspect of the pancreatic head and image 6 mm (image 122, series 5).  The spleen, adrenal glands, and kidneys are normal.  The stomach demonstrates a post gastric bypass anatomy. No evidence obstruction. No acute findings of the small bowel or colon. A single loop of dilated small bowel in the mid pelvis with loops measuring 3.2 cm (coronal image 6).  No retroperitoneal periportal lymphadenopathy.  Post hysterectomy anatomy. The bladder is normal. No aggressive osseous lesion. Rounded  lucent lesion in the L4 vertebral body likely represents a benign hemangioma.  Review of the MIP images confirms the above findings.  IMPRESSION: 1. No evidence of aortic dissection or aneurysm. 2. No evidence of abdominal aortic dissection or aneurysm. 3. Progressive  intra and extrahepatic biliary duct dilatation may relate to prior cholecystectomy. Recommend correlation with liver function tests / bilirubin. If elevated considered MRCP excluded distal lesion. 4. Small cystic lesion in the head of the pancreas. Recommend follow-up MRI pancreatic protocol in 12 months. This recommendation follows ACR consensus guidelines: Managing Incidental Findings on Abdominal CT: White Paper of the ACR Incidental Findings Committee. J Am Coll Radiol 2010;7:754-773 5. Focally dilated loop of small bowel within the pelvis. This likely represents a focal ileus. No evidence of obstruction.   Electronically Signed   By: Suzy Bouchard M.D.   On: 01/03/2014 18:31   Ct Angio Abd/pel W/ And/or W/o  01/03/2014   CLINICAL DATA:  Chest pain which radiates to back. Evaluate for dissection.  EXAM: CT ANGIOGRAPHY CHEST, ABDOMEN AND PELVIS  TECHNIQUE: Multidetector CT imaging through the chest, abdomen and pelvis was performed using the standard protocol during bolus administration of intravenous contrast. Multiplanar reconstructed images including MIPs were obtained and reviewed to evaluate the vascular anatomy.  CONTRAST:  171mL OMNIPAQUE IOHEXOL 350 MG/ML SOLN  COMPARISON:  US ABDOMEN COMPLETE dated 02/23/2009; CT-ABD-PELV dated 10/24/2006  FINDINGS: CTA CHEST FINDINGS  Non IV contrast images demonstrate no intramural hematoma in the thoracic aorta. The contrast-enhanced images demonstrate no evidence of dissection or aneurysm of the thoracic aorta. The great vessels are normal. There is minimal dilatation of the ascending aorta measuring 32 mm x 31 mm. No evidence of pulmonary embolism. No pericardial fluid.  No axillary supraclavicular lymphadenopathy. No mediastinal hilar lymphadenopathy. Esophagus is normal.  Review of the lung parenchyma demonstrates mild scarring in the inferior lingula. No pleural fluid, pneumothorax, or pneumonia.  Review of the MIP images confirms the above findings.  CTA  ABDOMEN AND PELVIS FINDINGS  The abdominal aorta is normal in caliber without evidence of aneurysm or dissection. There is minimal intimal calcification. The SMA is patent. The splenic artery extends from the proper hepatic artery. IMA is patent. No aneurysm of the iliac arteries. There are bilateral single renal arteries.  No focal hepatic lesion. Post cholecystectomy. The common bile duct is dilated with mild intrahepatic biliary duct dilatation. The common bile duct measures 7 mm. The common hepatic duct measures 11 mm. There is no evidence of pancreatic duct dilatation. Small cystic lesion in the inferior aspect of the pancreatic head and image 6 mm (image 122, series 5).  The spleen, adrenal glands, and kidneys are normal.  The stomach demonstrates a post gastric bypass anatomy. No evidence obstruction. No acute findings of the small bowel or colon. A single loop of dilated small bowel in the mid pelvis with loops measuring 3.2 cm (coronal image 6).  No retroperitoneal periportal lymphadenopathy.  Post hysterectomy anatomy. The bladder is normal. No aggressive osseous lesion. Rounded lucent lesion in the L4 vertebral body likely represents a benign hemangioma.  Review of the MIP images confirms the above findings.  IMPRESSION: 1. No evidence of aortic dissection or aneurysm. 2. No evidence of abdominal aortic dissection or aneurysm. 3. Progressive intra and extrahepatic biliary duct dilatation may relate to prior cholecystectomy. Recommend correlation with liver function tests / bilirubin. If elevated considered MRCP excluded distal lesion. 4. Small cystic lesion in the head of the pancreas. Recommend follow-up  MRI pancreatic protocol in 12 months. This recommendation follows ACR consensus guidelines: Managing Incidental Findings on Abdominal CT: White Paper of the ACR Incidental Findings Committee. J Am Coll Radiol 2010;7:754-773 5. Focally dilated loop of small bowel within the pelvis. This likely represents  a focal ileus. No evidence of obstruction.   Electronically Signed   By: Suzy Bouchard M.D.   On: 01/03/2014 18:31    Microbiology: No results found for this or any previous visit (from the past 240 hour(s)).   Labs: Basic Metabolic Panel:  Recent Labs Lab 01/03/14 1320 01/04/14 0235 01/05/14 0825  NA 140 138 140  K 4.2 3.9 4.1  CL 103 101 101  CO2 25 24 29   GLUCOSE 104* 108* 96  BUN 16 10 6   CREATININE 0.88 0.70 0.84  CALCIUM 9.2 8.9 8.9   Liver Function Tests:  Recent Labs Lab 01/03/14 1320 01/04/14 1158 01/05/14 0825  AST 164* 305* 116*  ALT 60* 275* 183*  ALKPHOS 148* 240* 214*  BILITOT 0.7 0.8 0.7  PROT 7.0 6.5 6.5  ALBUMIN 3.9 3.4* 3.4*    Recent Labs Lab 01/03/14 1507 01/04/14 1158  LIPASE 33 27   No results found for this basename: AMMONIA,  in the last 168 hours CBC:  Recent Labs Lab 01/03/14 1320 01/04/14 0235 01/05/14 0825  WBC 5.5 5.3 5.6  NEUTROABS 3.0  --   --   HGB 12.4 11.7* 11.9*  HCT 36.2 34.5* 36.1  MCV 90.7 89.8 92.6  PLT 251 231 218   Cardiac Enzymes:  Recent Labs Lab 01/03/14 2325 01/04/14 0614 01/04/14 1158  TROPONINI <0.30 <0.30 <0.30   BNP: BNP (last 3 results) No results found for this basename: PROBNP,  in the last 8760 hours CBG: No results found for this basename: GLUCAP,  in the last 168 hours  Time coordinating discharge: 45 minutes  Signed:  Foye Damron  [facility] 01/05/2014, 1:13 PM

## 2014-01-05 NOTE — Progress Notes (Signed)
Subjective:  No chest pain, dyspnea or nausea  Objective:  Vital Signs in the last 24 hours: BP 104/56  Pulse 67  Temp(Src) 98.3 F (36.8 C) (Oral)  Resp 20  Ht 5\' 2"  (1.575 m)  Wt 131 lb 9.8 oz (59.7 kg)  BMI 24.07 kg/m2  SpO2 95%  Physical Exam: Pleasant white female in no acute distress HEENT: normal Neck: supple Lungs:  Clear Cardiac:  Regular rhythm, normal S1 and S2, no S3, 2/6 systolic murmur  Abdomen:  Soft, nontender, no masses Extremities:  No edema present  Intake/Output from previous day: 01/18 0701 - 01/19 0700 In: 1155 [P.O.:255; I.V.:900] Out: 1350 [Urine:1350]  Weight Filed Weights   01/03/14 1835  Weight: 131 lb 9.8 oz (59.7 kg)    Lab Results: Basic Metabolic Panel:  Recent Labs  01/03/14 1320 01/04/14 0235  NA 140 138  K 4.2 3.9  CL 103 101  CO2 25 24  GLUCOSE 104* 108*  BUN 16 10  CREATININE 0.88 0.70   CBC:  Recent Labs  01/03/14 1320 01/04/14 0235  WBC 5.5 5.3  NEUTROABS 3.0  --   HGB 12.4 11.7*  HCT 36.2 34.5*  MCV 90.7 89.8  PLT 251 231   Cardiac Enzymes:  Recent Labs  01/03/14 2325 01/04/14 0614 01/04/14 1158  TROPONINI <0.30 <0.30 <0.30    Telemetry: Sinus rhythm overnight  Assessment/Plan: 1. Coronary artery disease with diffusely diseased intermediate branch 2. Prolonged chest discomfort uncertain cause with normal troponins and normal EKG and no evidence of pulmonary embolus or dissection 3. Elevated LFTs  Recommendations: Continue present meds; would continue off statin for now given elevated LFTS (can be resumed as outpt once this improves). Agree with GI eval. She can then be DCed from a cardiac standpoint and fu with outpt nuclear study and then with Dr Sallyanne Kuster.  Please call with questions. Kirk Ruths  MD [facility]  01/05/2014, 6:32 AM

## 2014-01-09 DIAGNOSIS — H26499 Other secondary cataract, unspecified eye: Secondary | ICD-10-CM | POA: Diagnosis not present

## 2014-01-15 DIAGNOSIS — Z1231 Encounter for screening mammogram for malignant neoplasm of breast: Secondary | ICD-10-CM | POA: Diagnosis not present

## 2014-01-23 ENCOUNTER — Ambulatory Visit (INDEPENDENT_AMBULATORY_CARE_PROVIDER_SITE_OTHER): Payer: Medicare Other | Admitting: Cardiovascular Disease

## 2014-01-23 ENCOUNTER — Encounter: Payer: Self-pay | Admitting: Cardiovascular Disease

## 2014-01-23 VITALS — BP 124/76 | HR 92 | Ht 62.0 in | Wt 132.1 lb

## 2014-01-23 DIAGNOSIS — I2510 Atherosclerotic heart disease of native coronary artery without angina pectoris: Secondary | ICD-10-CM | POA: Diagnosis not present

## 2014-01-23 DIAGNOSIS — E785 Hyperlipidemia, unspecified: Secondary | ICD-10-CM | POA: Diagnosis not present

## 2014-01-23 DIAGNOSIS — Z789 Other specified health status: Secondary | ICD-10-CM | POA: Insufficient documentation

## 2014-01-23 HISTORY — DX: Hyperlipidemia, unspecified: E78.5

## 2014-01-23 HISTORY — DX: Other specified health status: Z78.9

## 2014-01-23 NOTE — Progress Notes (Signed)
Patient ID: Kimberly Stone, female   DOB: 1946/08/23, 68 y.o.   MRN: [id number]      Reason for office visit Followup after hospitalization for chest pain  The patient is a 68 y.o. year-old female with history of CAD (NSTEMI 2005 found to have severe disease in ramus intermedius too diffusely diseased to allow PCI), HTN, HLD, GERD, previous obesity and pre-diabetes s/p gastric bypass surgery. She was recently hospitalized for chest pain associated with abdominal distress and diarrhea, shortly after a vacation to [place]. She had elevated transaminases and had MRCP evidence of ductal dilatation and prominence of the ampulla suggesting a biliary etiology. Her symptoms resolved spontaneously and she is now back to her normal baseline. Her cardiac enzymes and ECG never showed major abnormalities although there was transient T-wave inversion in lead aVL only. In the hospital, NTG seemed to help relieve her symptoms, which could suggest angina or esophageal spasm. She is back to normal activity without any chest pain complaints   Allergies  Allergen Reactions  . Aspirin     PT CANNOT TAKE DUE TO HX OF GASTRIC BYPASS SURGERY   . Erythromycin     Current Outpatient Prescriptions  Medication Sig Dispense Refill  . ALPRAZolam (XANAX) 0.5 MG tablet Take 0.5 mg by mouth daily as needed for anxiety.       Marland Kitchen amLODipine (NORVASC) 5 MG tablet Take 5 mg by mouth daily with breakfast.      . buPROPion (WELLBUTRIN XL) 300 MG 24 hr tablet Take 300 mg by mouth daily.      . calcium-vitamin D (OSCAL WITH D) 500-200 MG-UNIT per tablet Take 1 tablet by mouth daily.      . clopidogrel (PLAVIX) 75 MG tablet Take 75 mg by mouth daily with breakfast.      . enalapril (VASOTEC) 5 MG tablet Take 5 mg by mouth daily with breakfast.      . LORazepam (ATIVAN) 1 MG tablet Take 1 mg by mouth at bedtime as needed for anxiety.       . Multiple Vitamin (MULTIVITAMIN WITH MINERALS) TABS Take 2 tablets by mouth daily.      .  nitroGLYCERIN (NITROSTAT) 0.4 MG SL tablet Place 1 tablet (0.4 mg total) under the tongue every 5 (five) minutes as needed for chest pain.  30 tablet  0  . pantoprazole (PROTONIX) 40 MG tablet Take 40 mg by mouth daily with breakfast.      . Vitamin D, Ergocalciferol, (DRISDOL) 50000 UNITS CAPS Take 50,000 Units by mouth 3 (three) times a week.      . escitalopram (LEXAPRO) 10 MG tablet Take 5 mg by mouth daily.       No current facility-administered medications for this visit.    Past Medical History  Diagnosis Date  . Hypertension   . Coronary artery disease   . Depression   . GERD (gastroesophageal reflux disease)   . Fibromyalgia   . Arthritis   . Anemia     from bypass- iron and B12  . Acute MI 11/2004    NONSTEMI 2005 NOINTERVENTION PER NOTE   . Post-menopausal   . Scoliosis   . Hyperlipidemia   . Obesity     hx of bipass, now resolved.  borderline diabetes also resolved.  Marland Kitchen GIB (gastrointestinal bleeding)     while on celecoxib post bypass  . Anxiety     Past Surgical History  Procedure Laterality Date  . Gastric bypass  01/2004  . Cardiac  catheterization      2005 Severely disease small optional diagonal vessel responsible far wall motion abnormalities wise to small and diffusely diseased for consideration of interventio.  . Colonoscopy    . Laparoscopic cholecystectomy  1993  . Total vaginal hysterectomy  1973    secondary to DUB - Dr. Connye Burkitt and Dr. Tamala Julian  . Knee arthroscopy Right   . Cataract extraction      Family History  Problem Relation Age of Onset  . Heart failure Mother   . Liver cancer Father   . Lung cancer Brother 16  . Cancer Brother   . Heart failure Maternal Grandmother   . Aneurysm Maternal Grandmother     ruptured abdominal aortic aneurysm    History   Social History  . Marital Status: Married    Spouse Name: Clifton James    Number of Children: 3  . Years of Education: LPN   Occupational History  . Retired    Social History Main  Topics  . Smoking status: Former Smoker -- 0.25 packs/day for 5 years    Types: Cigarettes    Quit date: 12/18/1996  . Smokeless tobacco: Never Used  . Alcohol Use: 0.0 oz/week    1-2 Glasses of wine per week     Comment: occasional alcohol, wine.    . Drug Use: No  . Sexual Activity: No     Comment: hysterectomy-1973   Other Topics Concern  . Not on file   Social History Narrative   Pt lives at home with family.  Drives and does not use assist device.     Caffeine Use: 2 cups daily          Review of systems: The patient specifically denies any chest pain at rest or with exertion, dyspnea at rest or with exertion, orthopnea, paroxysmal nocturnal dyspnea, syncope, palpitations, focal neurological deficits, intermittent claudication, lower extremity edema, unexplained weight gain, cough, hemoptysis or wheezing.  The patient also denies abdominal pain, nausea, vomiting, dysphagia, diarrhea, constipation, polyuria, polydipsia, dysuria, hematuria, frequency, urgency, abnormal bleeding or bruising, fever, chills, unexpected weight changes, mood swings, change in skin or hair texture, change in voice quality, auditory or visual problems, allergic reactions or rashes, new musculoskeletal complaints other than usual "aches and pains".   PHYSICAL EXAM BP 124/76  Pulse 92  Ht 5\' 2"  (1.575 m)  Wt 59.92 kg (132 lb 1.6 oz)  BMI 24.16 kg/m2  General: Alert, oriented x3, no distress Head: no evidence of trauma, PERRL, EOMI, no exophtalmos or lid lag, no myxedema, no xanthelasma; normal ears, nose and oropharynx Neck: normal jugular venous pulsations and no hepatojugular reflux; brisk carotid pulses without delay and no carotid bruits Chest: clear to auscultation, no signs of consolidation by percussion or palpation, normal fremitus, symmetrical and full respiratory excursions Cardiovascular: normal position and quality of the apical impulse, regular rhythm, normal first and second heart  sounds, no murmurs, rubs or gallops Abdomen: no tenderness or distention, no masses by palpation, no abnormal pulsatility or arterial bruits, normal bowel sounds, no hepatosplenomegaly Extremities: no clubbing, cyanosis or edema; 2+ radial, ulnar and brachial pulses bilaterally; 2+ right femoral, posterior tibial and dorsalis pedis pulses; 2+ left femoral, posterior tibial and dorsalis pedis pulses; no subclavian or femoral bruits Neurological: grossly nonfocal   Lipid Panel     Component Value Date/Time   CHOL 165 01/04/2014 0235   TRIG 86 01/04/2014 0235   HDL 79 01/04/2014 0235   CHOLHDL 2.1 01/04/2014 0235   VLDL  17 01/04/2014 0235   LDLCALC 69 01/04/2014 0235    BMET    Component Value Date/Time   NA 140 01/05/2014 0825   K 4.1 01/05/2014 0825   CL 101 01/05/2014 0825   CO2 29 01/05/2014 0825   GLUCOSE 96 01/05/2014 0825   BUN 6 01/05/2014 0825   CREATININE 0.84 01/05/2014 0825   CALCIUM 8.9 01/05/2014 0825   GFRNONAA 70* 01/05/2014 0825   GFRAA 82* 01/05/2014 0825     ASSESSMENT AND PLAN  The exact etiology of her recent discomfort is unclear, but most of the findings point towards a gastrointestinal etiology. She is no longer taking statins because of the transaminitis. When PSCK9 inhibitors become available they might be a good choice for her. She has a follow up visit with her gastroenterologist in a few days. At this point in time further workup does not appear necessary. She will followup on a yearly basis but will call sooner if she develops new complaints.  Meds ordered this encounter  Medications  . escitalopram (LEXAPRO) 10 MG tablet    Sig: Take 5 mg by mouth daily.    Holli Humbles, MD, [place] [phone] office [phone] pager

## 2014-01-23 NOTE — Patient Instructions (Signed)
Your physician recommends that you schedule a follow-up appointment in: ONE YEAR 

## 2014-01-26 DIAGNOSIS — R197 Diarrhea, unspecified: Secondary | ICD-10-CM | POA: Diagnosis not present

## 2014-01-26 DIAGNOSIS — Z9884 Bariatric surgery status: Secondary | ICD-10-CM | POA: Diagnosis not present

## 2014-01-26 DIAGNOSIS — R748 Abnormal levels of other serum enzymes: Secondary | ICD-10-CM | POA: Diagnosis not present

## 2014-01-26 DIAGNOSIS — R932 Abnormal findings on diagnostic imaging of liver and biliary tract: Secondary | ICD-10-CM | POA: Diagnosis not present

## 2014-01-30 ENCOUNTER — Other Ambulatory Visit: Payer: Self-pay | Admitting: Cardiology

## 2014-01-30 DIAGNOSIS — N6489 Other specified disorders of breast: Secondary | ICD-10-CM | POA: Diagnosis not present

## 2014-02-03 DIAGNOSIS — F329 Major depressive disorder, single episode, unspecified: Secondary | ICD-10-CM | POA: Diagnosis not present

## 2014-02-03 DIAGNOSIS — IMO0002 Reserved for concepts with insufficient information to code with codable children: Secondary | ICD-10-CM | POA: Diagnosis not present

## 2014-02-03 DIAGNOSIS — R945 Abnormal results of liver function studies: Secondary | ICD-10-CM | POA: Diagnosis not present

## 2014-02-03 DIAGNOSIS — I10 Essential (primary) hypertension: Secondary | ICD-10-CM | POA: Diagnosis not present

## 2014-02-03 DIAGNOSIS — F3289 Other specified depressive episodes: Secondary | ICD-10-CM | POA: Diagnosis not present

## 2014-02-03 DIAGNOSIS — I2510 Atherosclerotic heart disease of native coronary artery without angina pectoris: Secondary | ICD-10-CM | POA: Diagnosis not present

## 2014-02-13 ENCOUNTER — Encounter: Payer: Self-pay | Admitting: Nurse Practitioner

## 2014-02-17 DIAGNOSIS — R197 Diarrhea, unspecified: Secondary | ICD-10-CM | POA: Diagnosis not present

## 2014-02-17 DIAGNOSIS — R748 Abnormal levels of other serum enzymes: Secondary | ICD-10-CM | POA: Diagnosis not present

## 2014-04-02 ENCOUNTER — Other Ambulatory Visit (HOSPITAL_COMMUNITY): Payer: Self-pay | Admitting: Internal Medicine

## 2014-04-02 DIAGNOSIS — R945 Abnormal results of liver function studies: Secondary | ICD-10-CM

## 2014-04-03 DIAGNOSIS — Z961 Presence of intraocular lens: Secondary | ICD-10-CM | POA: Diagnosis not present

## 2014-04-03 DIAGNOSIS — H18419 Arcus senilis, unspecified eye: Secondary | ICD-10-CM | POA: Diagnosis not present

## 2014-04-03 DIAGNOSIS — H02839 Dermatochalasis of unspecified eye, unspecified eyelid: Secondary | ICD-10-CM | POA: Diagnosis not present

## 2014-04-03 DIAGNOSIS — H26499 Other secondary cataract, unspecified eye: Secondary | ICD-10-CM | POA: Diagnosis not present

## 2014-04-10 DIAGNOSIS — I10 Essential (primary) hypertension: Secondary | ICD-10-CM | POA: Diagnosis not present

## 2014-04-13 ENCOUNTER — Ambulatory Visit (HOSPITAL_COMMUNITY)
Admission: RE | Admit: 2014-04-13 | Discharge: 2014-04-13 | Disposition: A | Discharge status: Home / Self Care | Payer: Medicare Other | Source: Ambulatory Visit | Attending: Internal Medicine | Admitting: Internal Medicine

## 2014-04-13 ENCOUNTER — Other Ambulatory Visit (HOSPITAL_COMMUNITY): Payer: Self-pay | Admitting: Internal Medicine

## 2014-04-13 DIAGNOSIS — R7989 Other specified abnormal findings of blood chemistry: Secondary | ICD-10-CM | POA: Diagnosis not present

## 2014-04-13 DIAGNOSIS — R945 Abnormal results of liver function studies: Secondary | ICD-10-CM

## 2014-04-13 DIAGNOSIS — I10 Essential (primary) hypertension: Secondary | ICD-10-CM | POA: Diagnosis not present

## 2014-04-13 DIAGNOSIS — Q619 Cystic kidney disease, unspecified: Secondary | ICD-10-CM | POA: Diagnosis not present

## 2014-04-13 LAB — CREATININE, SERUM
Creatinine, Ser: 0.81 mg/dL (ref 0.50–1.10)
GFR calc Af Amer: 85 mL/min — ABNORMAL LOW (ref >90)
GFR calc non Af Amer: 73 mL/min — ABNORMAL LOW (ref >90)

## 2014-04-13 MED ORDER — GADOBENATE DIMEGLUMINE 529 MG/ML IV SOLN
12.0000 mL | Freq: Once | INTRAVENOUS | Status: AC | PRN
  Administered 2014-04-13: 12 mL via INTRAVENOUS

## 2014-05-14 ENCOUNTER — Ambulatory Visit: Payer: BLUE CROSS/BLUE SHIELD | Admitting: Nurse Practitioner

## 2014-05-19 ENCOUNTER — Ambulatory Visit: Payer: BLUE CROSS/BLUE SHIELD | Admitting: Nurse Practitioner

## 2014-06-03 DIAGNOSIS — L570 Actinic keratosis: Secondary | ICD-10-CM | POA: Diagnosis not present

## 2014-06-03 DIAGNOSIS — D239 Other benign neoplasm of skin, unspecified: Secondary | ICD-10-CM | POA: Diagnosis not present

## 2014-06-03 DIAGNOSIS — L259 Unspecified contact dermatitis, unspecified cause: Secondary | ICD-10-CM | POA: Diagnosis not present

## 2014-06-03 DIAGNOSIS — L821 Other seborrheic keratosis: Secondary | ICD-10-CM | POA: Diagnosis not present

## 2014-06-03 DIAGNOSIS — D485 Neoplasm of uncertain behavior of skin: Secondary | ICD-10-CM | POA: Diagnosis not present

## 2014-06-16 ENCOUNTER — Ambulatory Visit (INDEPENDENT_AMBULATORY_CARE_PROVIDER_SITE_OTHER): Payer: Medicare Other | Admitting: Nurse Practitioner

## 2014-06-16 ENCOUNTER — Encounter: Payer: Self-pay | Admitting: Nurse Practitioner

## 2014-06-16 VITALS — BP 100/70 | HR 68 | Resp 16 | Ht 62.0 in | Wt 131.0 lb

## 2014-06-16 DIAGNOSIS — Z01419 Encounter for gynecological examination (general) (routine) without abnormal findings: Secondary | ICD-10-CM | POA: Diagnosis not present

## 2014-06-16 MED ORDER — ESTRADIOL 0.1 MG/GM VA CREA: TOPICAL_CREAM | VAGINAL | Status: DC

## 2014-06-16 NOTE — Patient Instructions (Signed)

## 2014-06-16 NOTE — Progress Notes (Signed)
68 y.o. G2P2 Married Caucasian Fe here for annual exam.  No new diagnosis.  Using estrace vaginal cream occasionally.  Still raising 2 grandchildren after her daughter was killed.  They are now 57 & 16.  No LMP recorded. Patient has had a hysterectomy.          Sexually active: No The current method of family planning is status post hysterectomy.    Exercising: Yes.    Walking, gardening. x 3-4 weekly Smoker:  no  Health Maintenance: Pap:  05/2002 Neg MMG:  01/2014 BIRADS1 Colonoscopy:  2014 - Normal, repeat in 10 years BMD:   04/2010 and maybe repeat since at PCP TDaP:  2010 Shingles Vaccine: age 61 Labs: PCP   reports that she quit smoking about 17 years ago. Her smoking use included Cigarettes. She has a 1.25 pack-year smoking history. She has never used smokeless tobacco. She reports that she drinks alcohol. She reports that she does not use illicit drugs.  Past Medical History  Diagnosis Date  . Hypertension   . Coronary artery disease   . Depression   . GERD (gastroesophageal reflux disease)   . Fibromyalgia   . Arthritis   . Anemia     from bypass- iron and B12  . Acute MI 11/2004    NONSTEMI 2005 NOINTERVENTION PER NOTE   . Post-menopausal   . Scoliosis   . Hyperlipidemia   . Obesity     hx of bipass, now resolved.  borderline diabetes also resolved.  Marland Kitchen GIB (gastrointestinal bleeding)     while on celecoxib post bypass  . Anxiety   . Hyperlipidemia LDL goal < 130 01/23/2014  . Statin intolerance 01/23/2014    Past Surgical History  Procedure Laterality Date  . Gastric bypass  01/2004  . Cardiac catheterization      2005 Severely disease small optional diagonal vessel responsible far wall motion abnormalities wise to small and diffusely diseased for consideration of interventio.  . Colonoscopy    . Laparoscopic cholecystectomy  1993  . Total vaginal hysterectomy  1973    secondary to DUB - Dr. Connye Burkitt and Dr. Tamala Julian  . Knee arthroscopy Right   . Cataract extraction       Current Outpatient Prescriptions  Medication Sig Dispense Refill  . ALPRAZolam (XANAX) 0.5 MG tablet Take 0.5 mg by mouth daily as needed for anxiety.       Marland Kitchen amLODipine (NORVASC) 5 MG tablet Take 5 mg by mouth daily with breakfast.      . calcium-vitamin D (OSCAL WITH D) 500-200 MG-UNIT per tablet Take 1 tablet by mouth daily.      . clopidogrel (PLAVIX) 75 MG tablet TAKE ONE TABLET BY MOUTH DAILY  30 tablet  6  . enalapril (VASOTEC) 5 MG tablet Take 5 mg by mouth daily with breakfast.      . escitalopram (LEXAPRO) 10 MG tablet Take 5 mg by mouth daily.      . Multiple Vitamin (MULTIVITAMIN WITH MINERALS) TABS Take 2 tablets by mouth daily.      . pantoprazole (PROTONIX) 40 MG tablet Take 40 mg by mouth daily with breakfast.      . Vitamin D, Ergocalciferol, (DRISDOL) 50000 UNITS CAPS Take 50,000 Units by mouth 3 (three) times a week.      . estradiol (ESTRACE) 0.1 MG/GM vaginal cream Use 1/2 g vaginally twice weekly  42.5 g  3  . nitroGLYCERIN (NITROSTAT) 0.4 MG SL tablet Place 1 tablet (0.4 mg total) under  the tongue every 5 (five) minutes as needed for chest pain.  30 tablet  0   No current facility-administered medications for this visit.    Family History  Problem Relation Age of Onset  . Heart failure Mother   . Liver cancer Father   . Lung cancer Brother 7  . Cancer Brother   . Heart failure Maternal Grandmother   . Aneurysm Maternal Grandmother     ruptured abdominal aortic aneurysm    ROS:  Pertinent items are noted in HPI.  Otherwise, a comprehensive ROS was negative.  Exam:   BP 100/70  Pulse 68  Resp 16  Ht 5\' 2"  (1.575 m)  Wt 131 lb (59.421 kg)  BMI 23.95 kg/m2 Height: 5\' 2"  (157.5 cm)  Ht Readings from Last 3 Encounters:  06/16/14 5\' 2"  (1.575 m)  01/23/14 5\' 2"  (1.575 m)  01/03/14 5\' 2"  (1.575 m)    General appearance: alert, cooperative and appears stated age Head: Normocephalic, without obvious abnormality, atraumatic Neck: no adenopathy, supple,  symmetrical, trachea midline and thyroid normal to inspection and palpation Lungs: clear to auscultation bilaterally Breasts: normal appearance, no masses or tenderness Heart: regular rate and rhythm Abdomen: soft, non-tender; no masses,  no organomegaly Extremities: extremities normal, atraumatic, no cyanosis or edema Skin: Skin color, texture, turgor normal. No rashes or lesions Lymph nodes: Cervical, supraclavicular, and axillary nodes normal. No abnormal inguinal nodes palpated Neurologic: Grossly normal   Pelvic: External genitalia:  no lesions              Urethra:  normal appearing urethra with no masses, tenderness or lesions              Bartholin's and Skene's: normal                 Vagina: atrophic appearing vagina with pale color and discharge, no lesions              Cervix: absent              Pap taken: No. Bimanual Exam:  Uterus:  uterus absent              Adnexa: no mass, fullness, tenderness               Rectovaginal: Confirms               Anus:  normal sphincter tone, no lesions  A:  Well Woman with normal exam  S/P TVH 1973 secondary to DUB.  S/P Gastric Bypass 01/2004  History of MI 12/05 with CAD  History of atrophic vaginitis  P:   Reviewed health and wellness pertinent to exam  Pap smear not taken today  Mammogram is due 01/2015  Refill on Estrace vaginal cream to use only prn  Counseled with risk of CVA, DVT, cancer, etc.  She is aware if Plavix is DC then must DC Estrace Vaginal cream.  Counseled on breast self exam, mammography screening, osteoporosis, adequate intake of calcium and vitamin D, diet and exercise, Kegel's exercises return annually or prn  An After Visit Summary was printed and given to the patient.

## 2014-06-16 NOTE — Progress Notes (Signed)
Encounter reviewed by Dr. Damareon Lanni Silva.  

## 2014-06-23 ENCOUNTER — Telehealth: Payer: Self-pay | Admitting: Nurse Practitioner

## 2014-06-23 NOTE — Telephone Encounter (Signed)
BMD done 06/04/2012 shows T Score: Spine -2.0; right femoral neck -1.1, total -0.4; left femoral neck -0.9, total -0.2.

## 2014-08-14 NOTE — Telephone Encounter (Signed)
Noted  

## 2014-08-20 DIAGNOSIS — M899 Disorder of bone, unspecified: Secondary | ICD-10-CM | POA: Diagnosis not present

## 2014-08-20 DIAGNOSIS — I2510 Atherosclerotic heart disease of native coronary artery without angina pectoris: Secondary | ICD-10-CM | POA: Diagnosis not present

## 2014-08-20 DIAGNOSIS — I10 Essential (primary) hypertension: Secondary | ICD-10-CM | POA: Diagnosis not present

## 2014-08-20 DIAGNOSIS — E785 Hyperlipidemia, unspecified: Secondary | ICD-10-CM | POA: Diagnosis not present

## 2014-08-20 DIAGNOSIS — M949 Disorder of cartilage, unspecified: Secondary | ICD-10-CM | POA: Diagnosis not present

## 2014-08-24 IMAGING — CT CT ANGIO CHEST
1 of 2 series · 18 of 32 positions shown · IV contrast (omnipaque)
Comparison: US ABDOMEN COMPLETE dated 02/23/2009; CT-ABD-PELV dated
10/24/2006

CLINICAL DATA: Chest pain which radiates to back. Evaluate for
dissection.

EXAM:
CT ANGIOGRAPHY CHEST, ABDOMEN AND PELVIS
TECHNIQUE: Multidetector CT imaging through the chest, abdomen and pelvis was
performed using the standard protocol during bolus administration of
intravenous contrast. Multiplanar reconstructed images including
MIPs were obtained and reviewed to evaluate the vascular anatomy.
CONTRAST:  125mL OMNIPAQUE IOHEXOL 350 MG/ML SOLN

[Series 5: arterial 3.0 b30f · axial · arterial · 0.68mm/px · z∈[+946,+1474]mm · 18 of 192 slices shown]
[im 8/192  lung]
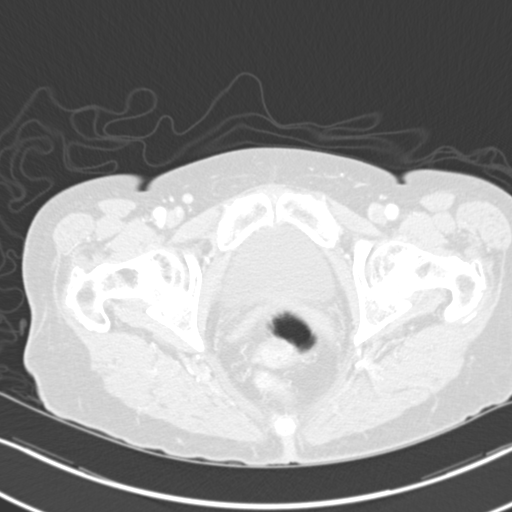
[im 16/192  soft-tissue]
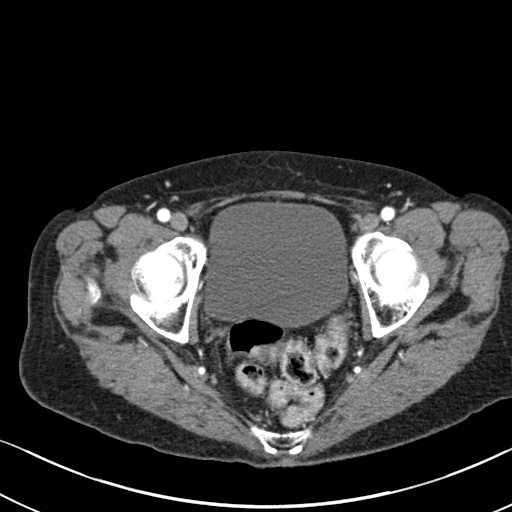
[im 32/192  lung]
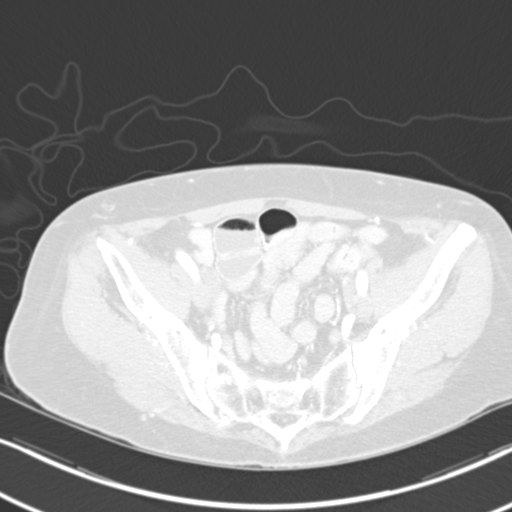
[im 40/192  soft-tissue]
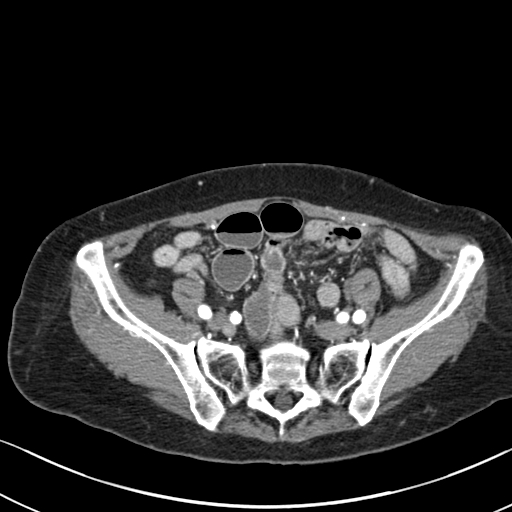
[im 48/192  lung]
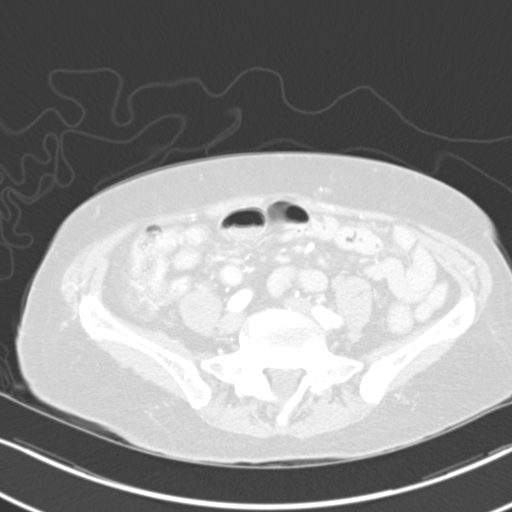
[im 64/192  soft-tissue]
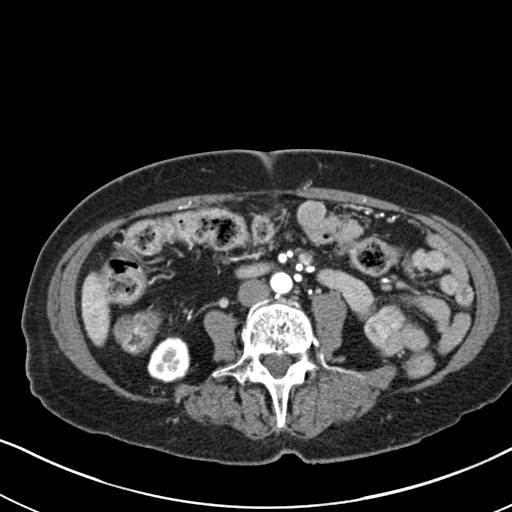
[im 72/192  lung]
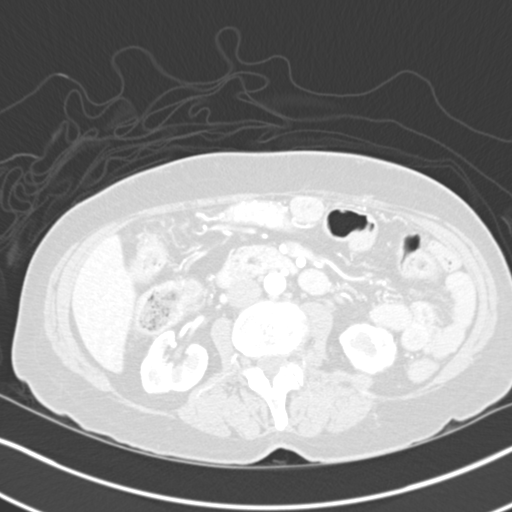
[im 80/192  soft-tissue]
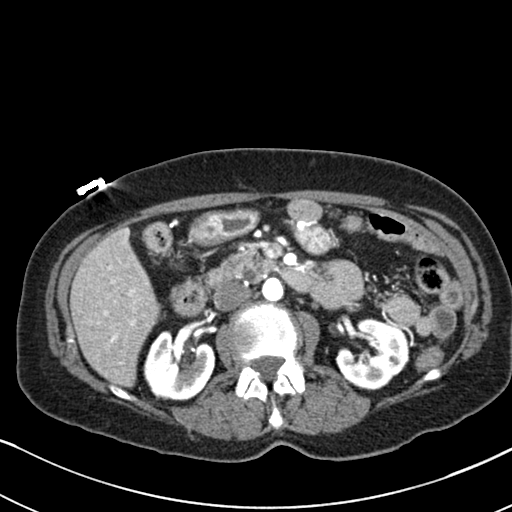
[im 88/192  lung]
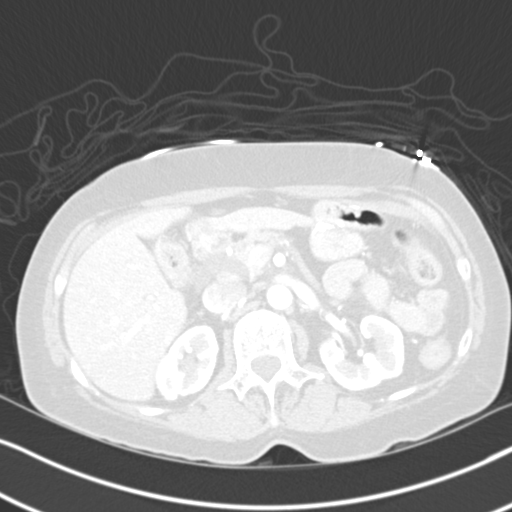
[im 104/192  soft-tissue]
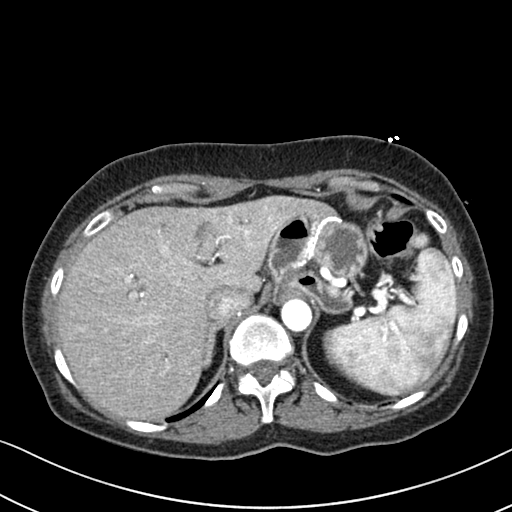
[im 112/192  lung]
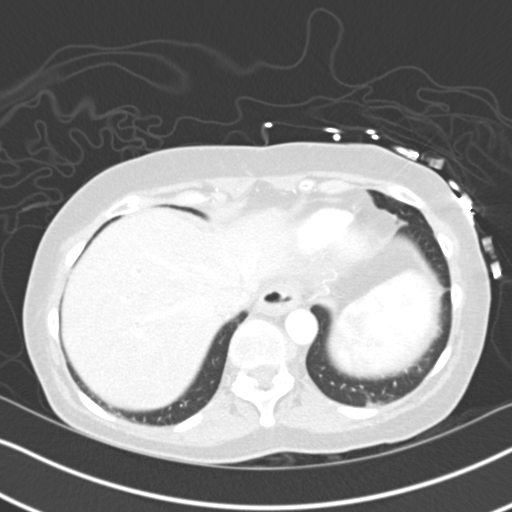
[im 120/192  soft-tissue]
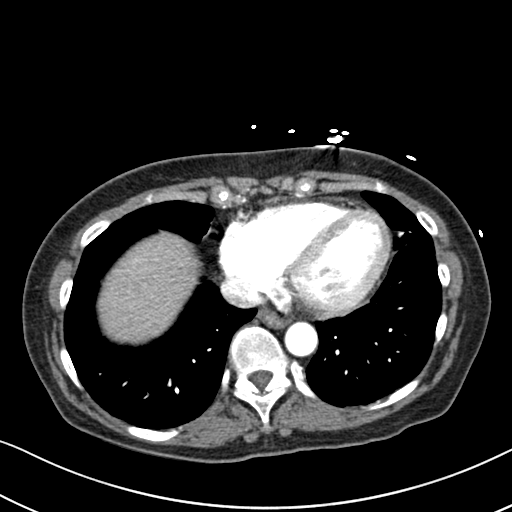
[im 128/192  lung]
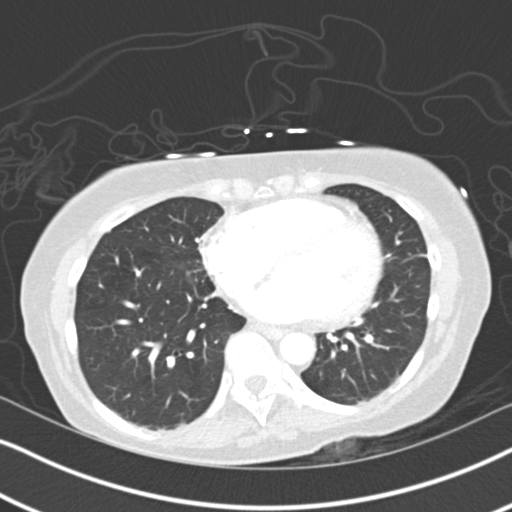
[im 144/192  soft-tissue]
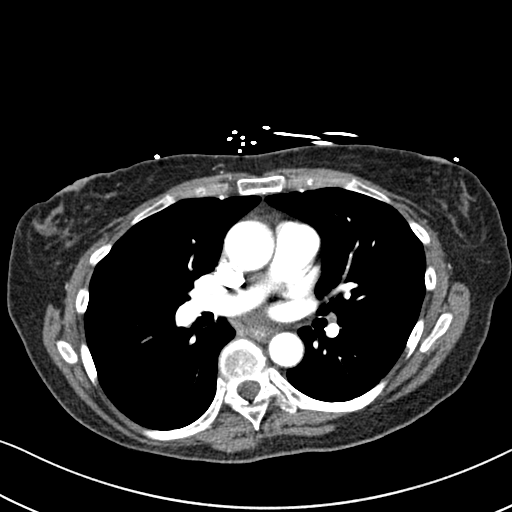
[im 152/192  lung]
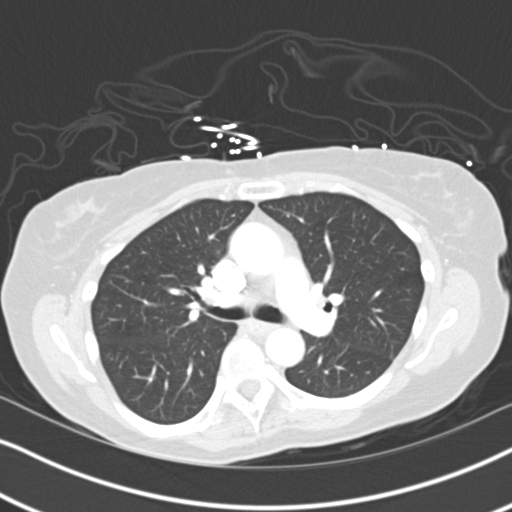
[im 160/192  soft-tissue]
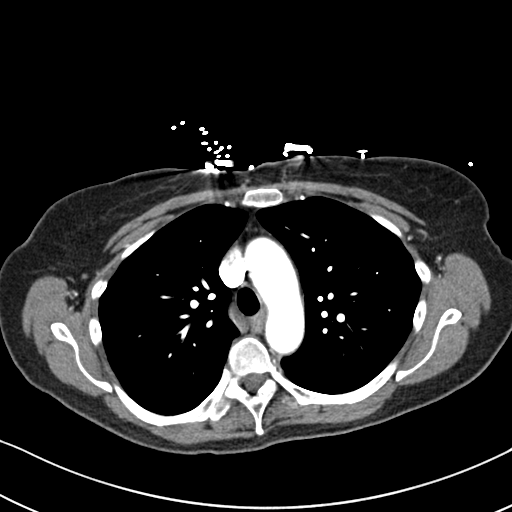
[im 176/192  lung]
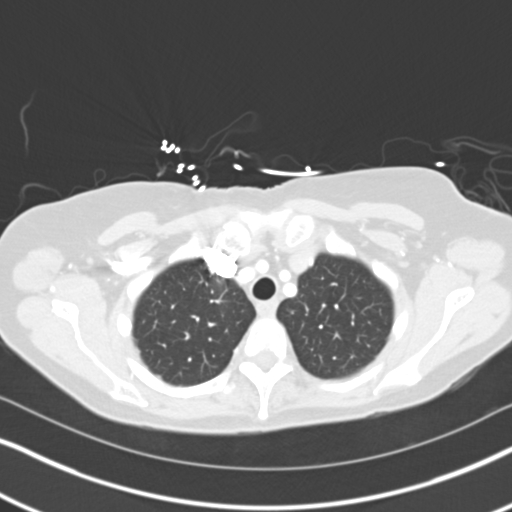
[im 184/192  soft-tissue]
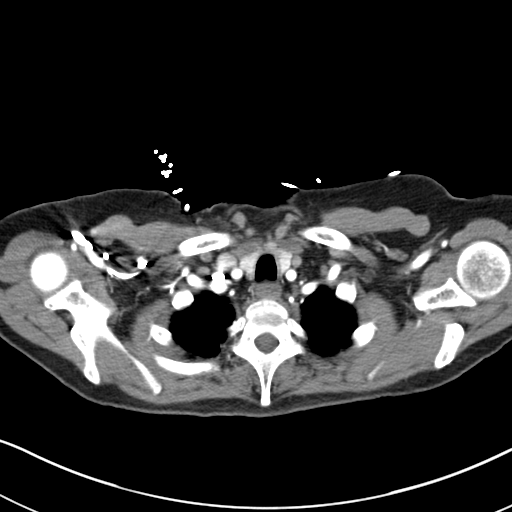

[18 of 32 positions shown; findings below may reference images not displayed]

FINDINGS: CTA CHEST FINDINGS

Non IV contrast images demonstrate no intramural hematoma in the
thoracic aorta. The contrast-enhanced images demonstrate no evidence
of dissection or aneurysm of the thoracic aorta. The great vessels
are normal. There is minimal dilatation of the ascending aorta
measuring 32 mm x 31 mm. No evidence of pulmonary embolism. No
pericardial fluid.

No axillary supraclavicular lymphadenopathy. No mediastinal hilar
lymphadenopathy. Esophagus is normal.

Review of the lung parenchyma demonstrates mild scarring in the
inferior lingula. No pleural fluid, pneumothorax, or pneumonia.

Review of the MIP images confirms the above findings.

CTA ABDOMEN AND PELVIS FINDINGS

The abdominal aorta is normal in caliber without evidence of
aneurysm or dissection. There is minimal intimal calcification. The
SMA is patent. The splenic artery extends from the proper hepatic
artery. IMA is patent. No aneurysm of the iliac arteries. There are
bilateral single renal arteries.

No focal hepatic lesion. Post cholecystectomy. The common bile duct
is dilated with mild intrahepatic biliary duct dilatation. The
common bile duct measures 7 mm. The common hepatic duct measures 11
mm. There is no evidence of pancreatic duct dilatation. Small cystic
lesion in the inferior aspect of the pancreatic head and image 6 mm
(image 122, series 5).

The spleen, adrenal glands, and kidneys are normal.

The stomach demonstrates a post gastric bypass anatomy. No evidence
obstruction. No acute findings of the small bowel or colon. A single
loop of dilated small bowel in the mid pelvis with loops measuring
3.2 cm (coronal image 6).

No retroperitoneal periportal lymphadenopathy.

Post hysterectomy anatomy. The bladder is normal. No aggressive
osseous lesion. Rounded lucent lesion in the L4 vertebral body
likely represents a benign hemangioma.

Review of the MIP images confirms the above findings.
IMPRESSION: 1. No evidence of aortic dissection or aneurysm.
2. No evidence of abdominal aortic dissection or aneurysm.
3. Progressive intra and extrahepatic biliary duct dilatation may
relate to prior cholecystectomy. Recommend correlation with liver
function tests / bilirubin. If elevated considered MRCP excluded
distal lesion.
4. Small cystic lesion in the head of the pancreas. Recommend
follow-up MRI pancreatic protocol in 12 months. This recommendation
follows ACR consensus guidelines: Managing Incidental Findings on
Abdominal CT: White Paper of the ACR Incidental Findings Committee.
[HOSPITAL] [id number];[DATE]
5. Focally dilated loop of small bowel within the pelvis. This
likely represents a focal ileus. No evidence of obstruction.

## 2014-08-27 DIAGNOSIS — M2550 Pain in unspecified joint: Secondary | ICD-10-CM | POA: Diagnosis not present

## 2014-08-27 DIAGNOSIS — F3289 Other specified depressive episodes: Secondary | ICD-10-CM | POA: Diagnosis not present

## 2014-08-27 DIAGNOSIS — Z1331 Encounter for screening for depression: Secondary | ICD-10-CM | POA: Diagnosis not present

## 2014-08-27 DIAGNOSIS — M1990 Unspecified osteoarthritis, unspecified site: Secondary | ICD-10-CM | POA: Diagnosis not present

## 2014-08-27 DIAGNOSIS — I2510 Atherosclerotic heart disease of native coronary artery without angina pectoris: Secondary | ICD-10-CM | POA: Diagnosis not present

## 2014-08-27 DIAGNOSIS — F329 Major depressive disorder, single episode, unspecified: Secondary | ICD-10-CM | POA: Diagnosis not present

## 2014-08-27 DIAGNOSIS — K219 Gastro-esophageal reflux disease without esophagitis: Secondary | ICD-10-CM | POA: Diagnosis not present

## 2014-08-27 DIAGNOSIS — Z0000 Encounter for general adult medical examination without abnormal findings: Secondary | ICD-10-CM | POA: Diagnosis not present

## 2014-08-27 DIAGNOSIS — I10 Essential (primary) hypertension: Secondary | ICD-10-CM | POA: Diagnosis not present

## 2014-09-08 DIAGNOSIS — Z1212 Encounter for screening for malignant neoplasm of rectum: Secondary | ICD-10-CM | POA: Diagnosis not present

## 2014-10-19 ENCOUNTER — Encounter: Payer: Self-pay | Admitting: Nurse Practitioner

## 2014-10-30 DIAGNOSIS — I2510 Atherosclerotic heart disease of native coronary artery without angina pectoris: Secondary | ICD-10-CM | POA: Diagnosis not present

## 2014-10-30 DIAGNOSIS — M19041 Primary osteoarthritis, right hand: Secondary | ICD-10-CM | POA: Diagnosis not present

## 2014-10-30 DIAGNOSIS — M2550 Pain in unspecified joint: Secondary | ICD-10-CM | POA: Diagnosis not present

## 2014-10-30 DIAGNOSIS — M150 Primary generalized (osteo)arthritis: Secondary | ICD-10-CM | POA: Diagnosis not present

## 2014-10-30 DIAGNOSIS — M170 Bilateral primary osteoarthritis of knee: Secondary | ICD-10-CM | POA: Diagnosis not present

## 2014-10-30 DIAGNOSIS — Z9884 Bariatric surgery status: Secondary | ICD-10-CM | POA: Diagnosis not present

## 2014-10-30 DIAGNOSIS — F329 Major depressive disorder, single episode, unspecified: Secondary | ICD-10-CM | POA: Diagnosis not present

## 2014-10-30 DIAGNOSIS — R5382 Chronic fatigue, unspecified: Secondary | ICD-10-CM | POA: Diagnosis not present

## 2014-10-30 DIAGNOSIS — M19042 Primary osteoarthritis, left hand: Secondary | ICD-10-CM | POA: Diagnosis not present

## 2014-12-03 DIAGNOSIS — M5032 Other cervical disc degeneration, mid-cervical region: Secondary | ICD-10-CM | POA: Diagnosis not present

## 2014-12-03 DIAGNOSIS — M2550 Pain in unspecified joint: Secondary | ICD-10-CM | POA: Diagnosis not present

## 2014-12-03 DIAGNOSIS — M1991 Primary osteoarthritis, unspecified site: Secondary | ICD-10-CM | POA: Diagnosis not present

## 2014-12-03 DIAGNOSIS — M150 Primary generalized (osteo)arthritis: Secondary | ICD-10-CM | POA: Diagnosis not present

## 2014-12-03 DIAGNOSIS — R5383 Other fatigue: Secondary | ICD-10-CM | POA: Diagnosis not present

## 2014-12-03 DIAGNOSIS — G5602 Carpal tunnel syndrome, left upper limb: Secondary | ICD-10-CM | POA: Diagnosis not present

## 2014-12-03 DIAGNOSIS — G5601 Carpal tunnel syndrome, right upper limb: Secondary | ICD-10-CM | POA: Diagnosis not present

## 2014-12-03 DIAGNOSIS — R5382 Chronic fatigue, unspecified: Secondary | ICD-10-CM | POA: Diagnosis not present

## 2014-12-08 DIAGNOSIS — M25642 Stiffness of left hand, not elsewhere classified: Secondary | ICD-10-CM | POA: Diagnosis not present

## 2014-12-08 DIAGNOSIS — M79642 Pain in left hand: Secondary | ICD-10-CM | POA: Diagnosis not present

## 2014-12-08 DIAGNOSIS — Z5189 Encounter for other specified aftercare: Secondary | ICD-10-CM | POA: Diagnosis not present

## 2014-12-08 DIAGNOSIS — M150 Primary generalized (osteo)arthritis: Secondary | ICD-10-CM | POA: Diagnosis not present

## 2014-12-25 ENCOUNTER — Other Ambulatory Visit: Payer: Self-pay | Admitting: Internal Medicine

## 2014-12-25 NOTE — Telephone Encounter (Signed)
Rx(s) sent to pharmacy electronically.  

## 2014-12-29 DIAGNOSIS — Z6825 Body mass index (BMI) 25.0-25.9, adult: Secondary | ICD-10-CM | POA: Diagnosis not present

## 2014-12-29 DIAGNOSIS — M797 Fibromyalgia: Secondary | ICD-10-CM | POA: Diagnosis not present

## 2014-12-29 DIAGNOSIS — E785 Hyperlipidemia, unspecified: Secondary | ICD-10-CM | POA: Diagnosis not present

## 2014-12-29 DIAGNOSIS — I10 Essential (primary) hypertension: Secondary | ICD-10-CM | POA: Diagnosis not present

## 2014-12-29 DIAGNOSIS — I2510 Atherosclerotic heart disease of native coronary artery without angina pectoris: Secondary | ICD-10-CM | POA: Diagnosis not present

## 2014-12-29 DIAGNOSIS — F419 Anxiety disorder, unspecified: Secondary | ICD-10-CM | POA: Diagnosis not present

## 2015-02-03 ENCOUNTER — Ambulatory Visit (INDEPENDENT_AMBULATORY_CARE_PROVIDER_SITE_OTHER): Payer: Medicare Other | Admitting: Cardiovascular Disease

## 2015-02-03 ENCOUNTER — Encounter: Payer: Self-pay | Admitting: Cardiovascular Disease

## 2015-02-03 VITALS — BP 112/76 | HR 67 | Resp 16 | Ht 62.0 in | Wt 141.1 lb

## 2015-02-03 DIAGNOSIS — R011 Cardiac murmur, unspecified: Secondary | ICD-10-CM

## 2015-02-03 DIAGNOSIS — I10 Essential (primary) hypertension: Secondary | ICD-10-CM | POA: Diagnosis not present

## 2015-02-03 DIAGNOSIS — E785 Hyperlipidemia, unspecified: Secondary | ICD-10-CM | POA: Diagnosis not present

## 2015-02-03 DIAGNOSIS — Z789 Other specified health status: Secondary | ICD-10-CM

## 2015-02-03 DIAGNOSIS — Z889 Allergy status to unspecified drugs, medicaments and biological substances status: Secondary | ICD-10-CM | POA: Diagnosis not present

## 2015-02-03 DIAGNOSIS — I2510 Atherosclerotic heart disease of native coronary artery without angina pectoris: Secondary | ICD-10-CM

## 2015-02-03 NOTE — Progress Notes (Signed)
Patient ID: Kimberly Stone, female   DOB: [birth year], 69 y.o.   MRN: [id number]      Reason for office visit CAD, hyperlipidemia, statin intolerance  The patient is a 69 y.o. year-old female with history of CAD (NSTEMI 2005 found to have severe disease in ramus intermedius too diffusely diseased to allow PCI), HTN, HLD, GERD, previous obesity and pre-diabetes s/p gastric bypass surgery.She performs physical activity without any chest pain complaints. She complains of fatigue.  She has significant hyperlipidemia and since 2006 has tried treatment with simvastatin, atorvastatin, rosuvastatin, Livalo and Zetia but was intolerant to all of these secondary to liver abnormalities and/or myalgia. She received Repatha for 3 months from Dr. Joylene Draft, but it has been 3 weeks since her last dose and her insurance company has advised that they will not pay for this medication.    Allergies  Allergen Reactions  . Imodium [Loperamide] Palpitations  . Aspirin     PT CANNOT TAKE DUE TO HX OF GASTRIC BYPASS SURGERY   . Erythromycin     Current Outpatient Prescriptions  Medication Sig Dispense Refill  . acetaminophen (TYLENOL) 650 MG CR tablet Take 650 mg by mouth as needed.    . ALPRAZolam (XANAX) 0.5 MG tablet Take 0.5 mg by mouth daily as needed for anxiety.     Marland Kitchen amLODipine (NORVASC) 5 MG tablet Take 5 mg by mouth daily with breakfast.    . clopidogrel (PLAVIX) 75 MG tablet TAKE 1 TABLET BY MOUTH EVERY DAY 30 tablet 1  . diclofenac sodium (VOLTAREN) 1 % GEL as needed.    . enalapril (VASOTEC) 5 MG tablet Take 5 mg by mouth daily with breakfast.    . escitalopram (LEXAPRO) 10 MG tablet Take 5 mg by mouth daily.    Marland Kitchen estradiol (ESTRACE) 0.1 MG/GM vaginal cream Use 1/2 g vaginally twice weekly 42.5 g 3  . gabapentin (NEURONTIN) 300 MG capsule Take 300 mg by mouth daily.    . Multiple Vitamin (MULTIVITAMIN WITH MINERALS) TABS Take 2 tablets by mouth daily.    . nitroGLYCERIN (NITROSTAT) 0.4 MG SL  tablet Place 1 tablet (0.4 mg total) under the tongue every 5 (five) minutes as needed for chest pain. 30 tablet 0  . pantoprazole (PROTONIX) 40 MG tablet Take 40 mg by mouth daily with breakfast.    . Vitamin D, Ergocalciferol, (DRISDOL) 50000 UNITS CAPS Take 50,000 Units by mouth 3 (three) times a week.     No current facility-administered medications for this visit.    Past Medical History  Diagnosis Date  . Hypertension   . Coronary artery disease   . Depression   . GERD (gastroesophageal reflux disease)   . Fibromyalgia   . Arthritis   . Anemia     from bypass- iron and B12  . Acute MI 11/2004    NONSTEMI 2005 NOINTERVENTION PER NOTE   . Post-menopausal   . Scoliosis   . Hyperlipidemia   . Obesity     hx of bipass, now resolved.  borderline diabetes also resolved.  Marland Kitchen GIB (gastrointestinal bleeding)     while on celecoxib post bypass  . Anxiety   . Hyperlipidemia LDL goal < 130 01/23/2014  . Statin intolerance 01/23/2014    Past Surgical History  Procedure Laterality Date  . Gastric bypass  01/2004  . Cardiac catheterization      2005 Severely disease small optional diagonal vessel responsible far wall motion abnormalities wise to small and diffusely diseased for consideration of  interventio.  . Colonoscopy    . Laparoscopic cholecystectomy  1993  . Total vaginal hysterectomy  1973    secondary to DUB - Dr. Connye Burkitt and Dr. Tamala Julian  . Knee arthroscopy Right   . Cataract extraction      Family History  Problem Relation Age of Onset  . Heart failure Mother   . Liver cancer Father   . Lung cancer Brother 30  . Cancer Brother   . Heart failure Maternal Grandmother   . Aneurysm Maternal Grandmother     ruptured abdominal aortic aneurysm    History   Social History  . Marital Status: Married    Spouse Name: Clifton James  . Number of Children: 3  . Years of Education: LPN   Occupational History  . Retired    Social History Main Topics  . Smoking status: Former Smoker  -- 0.25 packs/day for 5 years    Types: Cigarettes    Quit date: 12/18/1996  . Smokeless tobacco: Never Used  . Alcohol Use: 0.0 oz/week    1-2 Glasses of wine per week     Comment: occasional alcohol, wine.    . Drug Use: No  . Sexual Activity: No     Comment: hysterectomy-1973   Other Topics Concern  . Not on file   Social History Narrative   Pt lives at home with family.  Drives and does not use assist device.     Caffeine Use: 2 cups daily          Review of systems: The patient specifically denies any chest pain at rest or with exertion, dyspnea at rest or with exertion, orthopnea, paroxysmal nocturnal dyspnea, syncope, palpitations, focal neurological deficits, intermittent claudication, lower extremity edema, unexplained weight gain, cough, hemoptysis or wheezing.  The patient also denies abdominal pain, nausea, vomiting, dysphagia, diarrhea, constipation, polyuria, polydipsia, dysuria, hematuria, frequency, urgency, abnormal bleeding or bruising, fever, chills, unexpected weight changes, mood swings, change in skin or hair texture, change in voice quality, auditory or visual problems, allergic reactions or rashes, new musculoskeletal complaints other than usual "aches and pains".   PHYSICAL EXAM BP 112/76 mmHg  Pulse 67  Ht 5\' 2"  (1.575 m)  Wt 64.003 kg (141 lb 1.6 oz)  BMI 25.80 kg/m2  General: Alert, oriented x3, no distress Head: no evidence of trauma, PERRL, EOMI, no exophtalmos or lid lag, no myxedema, no xanthelasma; normal ears, nose and oropharynx Neck: normal jugular venous pulsations and no hepatojugular reflux; brisk carotid pulses without delay and bilateral carotid bruits that probably radiate from the chest Chest: clear to auscultation, no signs of consolidation by percussion or palpation, normal fremitus, symmetrical and full respiratory excursions Cardiovascular: normal position and quality of the apical impulse, regular rhythm, normal first and second  heart sounds, 3/6 early peaking systolic ejection murmur in the aortic focus, rubs or gallops Abdomen: no tenderness or distention, no masses by palpation, no abnormal pulsatility or arterial bruits, normal bowel sounds, no hepatosplenomegaly Extremities: no clubbing, cyanosis or edema; 2+ radial, ulnar and brachial pulses bilaterally; 2+ right femoral, posterior tibial and dorsalis pedis pulses; 2+ left femoral, posterior tibial and dorsalis pedis pulses; no subclavian or femoral bruits Neurological: grossly nonfocal   EKG: Normal sinus rhythm, normal tracing  Lipid Panel on therapy, currently stopped    Component Value Date/Time   CHOL 165 01/04/2014 0235   TRIG 86 01/04/2014 0235   HDL 79 01/04/2014 0235   CHOLHDL 2.1 01/04/2014 0235   VLDL 17 01/04/2014 0235  LDLCALC 69 01/04/2014 0235    BMET    Component Value Date/Time   NA 140 01/05/2014 0825   K 4.1 01/05/2014 0825   CL 101 01/05/2014 0825   CO2 29 01/05/2014 0825   GLUCOSE 96 01/05/2014 0825   BUN 6 01/05/2014 0825   CREATININE 0.81 04/13/2014 0717   CALCIUM 8.9 01/05/2014 0825   GFRNONAA 73* 04/13/2014 0717   GFRAA 48* 04/13/2014 0717     ASSESSMENT AND PLAN  Mrs. Dome has established coronary artery disease and requires treatment for hyperlipidemia. Unfortunately has she has been intolerant to all statins and Zetia as well. We'll try to retrieve her most recent lipid profile off therapy from Dr. Joylene Draft. I think should be an excellent long-term candidate for treatment with Repatha. Asked our clinical pharmacist to try tohelp facilitate obtaining this medication approval from her insurance company.  Her murmur is much more distinct this year and I have recommended she have a follow-up echocardiogram for what could now be mild aortic stenosis.  Does not have current symptoms of acute coronary insufficiency. Blood pressure is well controlled and electrocardiogram is normal.   Orders Placed This Encounter    Procedures  . EKG 12-Lead  . 2D Echocardiogram without contrast   Meds ordered this encounter  Medications  . gabapentin (NEURONTIN) 300 MG capsule    Sig: Take 300 mg by mouth daily.  Marland Kitchen acetaminophen (TYLENOL) 650 MG CR tablet    Sig: Take 650 mg by mouth as needed.  . diclofenac sodium (VOLTAREN) 1 % GEL    Sig: as needed.    Holli Humbles, MD, [facility] [phone] office [phone] pager

## 2015-02-03 NOTE — Patient Instructions (Signed)
Your physician has requested that you have an echocardiogram. Echocardiography is a painless test that uses sound waves to create images of your heart. It provides your doctor with information about the size and shape of your heart and how well your heart's chambers and valves are working. This procedure takes approximately one hour. There are no restrictions for this procedure.  Dr. Sallyanne Kuster recommends that you schedule a follow-up appointment in: One year.

## 2015-02-09 ENCOUNTER — Ambulatory Visit (HOSPITAL_COMMUNITY)
Admission: RE | Admit: 2015-02-09 | Discharge: 2015-02-09 | Disposition: A | Discharge status: Home / Self Care | Payer: Medicare Other | Source: Ambulatory Visit | Attending: Cardiovascular Disease | Admitting: Cardiovascular Disease

## 2015-02-09 DIAGNOSIS — R011 Cardiac murmur, unspecified: Secondary | ICD-10-CM

## 2015-02-09 DIAGNOSIS — Z1231 Encounter for screening mammogram for malignant neoplasm of breast: Secondary | ICD-10-CM | POA: Diagnosis not present

## 2015-02-09 NOTE — Progress Notes (Signed)
2D Echo Performed 02/09/2015    Marygrace Drought, RCS

## 2015-02-26 DIAGNOSIS — M5090 Cervical disc disorder, unspecified, unspecified cervical region: Secondary | ICD-10-CM | POA: Diagnosis not present

## 2015-03-11 DIAGNOSIS — M542 Cervicalgia: Secondary | ICD-10-CM | POA: Diagnosis not present

## 2015-03-11 DIAGNOSIS — M19042 Primary osteoarthritis, left hand: Secondary | ICD-10-CM | POA: Diagnosis not present

## 2015-03-11 DIAGNOSIS — M2391 Unspecified internal derangement of right knee: Secondary | ICD-10-CM | POA: Diagnosis not present

## 2015-03-16 DIAGNOSIS — M19042 Primary osteoarthritis, left hand: Secondary | ICD-10-CM | POA: Diagnosis not present

## 2015-03-16 DIAGNOSIS — M542 Cervicalgia: Secondary | ICD-10-CM | POA: Diagnosis not present

## 2015-03-16 DIAGNOSIS — M2391 Unspecified internal derangement of right knee: Secondary | ICD-10-CM | POA: Diagnosis not present

## 2015-03-18 DIAGNOSIS — M2391 Unspecified internal derangement of right knee: Secondary | ICD-10-CM | POA: Diagnosis not present

## 2015-03-18 DIAGNOSIS — M19042 Primary osteoarthritis, left hand: Secondary | ICD-10-CM | POA: Diagnosis not present

## 2015-03-18 DIAGNOSIS — M542 Cervicalgia: Secondary | ICD-10-CM | POA: Diagnosis not present

## 2015-03-19 DIAGNOSIS — M2391 Unspecified internal derangement of right knee: Secondary | ICD-10-CM | POA: Diagnosis not present

## 2015-03-19 DIAGNOSIS — M19042 Primary osteoarthritis, left hand: Secondary | ICD-10-CM | POA: Diagnosis not present

## 2015-03-19 DIAGNOSIS — M542 Cervicalgia: Secondary | ICD-10-CM | POA: Diagnosis not present

## 2015-03-20 ENCOUNTER — Other Ambulatory Visit: Payer: Self-pay | Admitting: Internal Medicine

## 2015-03-22 NOTE — Telephone Encounter (Signed)
Rx(s) sent to pharmacy electronically.  

## 2015-03-24 DIAGNOSIS — M2391 Unspecified internal derangement of right knee: Secondary | ICD-10-CM | POA: Diagnosis not present

## 2015-03-24 DIAGNOSIS — M19042 Primary osteoarthritis, left hand: Secondary | ICD-10-CM | POA: Diagnosis not present

## 2015-03-24 DIAGNOSIS — M542 Cervicalgia: Secondary | ICD-10-CM | POA: Diagnosis not present

## 2015-03-26 DIAGNOSIS — M2391 Unspecified internal derangement of right knee: Secondary | ICD-10-CM | POA: Diagnosis not present

## 2015-03-26 DIAGNOSIS — M19042 Primary osteoarthritis, left hand: Secondary | ICD-10-CM | POA: Diagnosis not present

## 2015-03-26 DIAGNOSIS — M542 Cervicalgia: Secondary | ICD-10-CM | POA: Diagnosis not present

## 2015-03-29 DIAGNOSIS — M19042 Primary osteoarthritis, left hand: Secondary | ICD-10-CM | POA: Diagnosis not present

## 2015-03-29 DIAGNOSIS — M2391 Unspecified internal derangement of right knee: Secondary | ICD-10-CM | POA: Diagnosis not present

## 2015-03-29 DIAGNOSIS — M542 Cervicalgia: Secondary | ICD-10-CM | POA: Diagnosis not present

## 2015-03-31 DIAGNOSIS — M2391 Unspecified internal derangement of right knee: Secondary | ICD-10-CM | POA: Diagnosis not present

## 2015-03-31 DIAGNOSIS — M542 Cervicalgia: Secondary | ICD-10-CM | POA: Diagnosis not present

## 2015-03-31 DIAGNOSIS — M19042 Primary osteoarthritis, left hand: Secondary | ICD-10-CM | POA: Diagnosis not present

## 2015-04-05 DIAGNOSIS — M542 Cervicalgia: Secondary | ICD-10-CM | POA: Diagnosis not present

## 2015-04-05 DIAGNOSIS — M19042 Primary osteoarthritis, left hand: Secondary | ICD-10-CM | POA: Diagnosis not present

## 2015-04-05 DIAGNOSIS — M2391 Unspecified internal derangement of right knee: Secondary | ICD-10-CM | POA: Diagnosis not present

## 2015-04-07 DIAGNOSIS — M2391 Unspecified internal derangement of right knee: Secondary | ICD-10-CM | POA: Diagnosis not present

## 2015-04-07 DIAGNOSIS — M542 Cervicalgia: Secondary | ICD-10-CM | POA: Diagnosis not present

## 2015-04-07 DIAGNOSIS — M19042 Primary osteoarthritis, left hand: Secondary | ICD-10-CM | POA: Diagnosis not present

## 2015-04-13 DIAGNOSIS — M5090 Cervical disc disorder, unspecified, unspecified cervical region: Secondary | ICD-10-CM | POA: Diagnosis not present

## 2015-04-20 DIAGNOSIS — M5090 Cervical disc disorder, unspecified, unspecified cervical region: Secondary | ICD-10-CM | POA: Diagnosis not present

## 2015-04-28 DIAGNOSIS — M5090 Cervical disc disorder, unspecified, unspecified cervical region: Secondary | ICD-10-CM | POA: Diagnosis not present

## 2015-06-01 DIAGNOSIS — M5032 Other cervical disc degeneration, mid-cervical region: Secondary | ICD-10-CM | POA: Diagnosis not present

## 2015-06-03 DIAGNOSIS — M5134 Other intervertebral disc degeneration, thoracic region: Secondary | ICD-10-CM | POA: Diagnosis not present

## 2015-06-03 DIAGNOSIS — M810 Age-related osteoporosis without current pathological fracture: Secondary | ICD-10-CM | POA: Diagnosis not present

## 2015-06-03 DIAGNOSIS — G5601 Carpal tunnel syndrome, right upper limb: Secondary | ICD-10-CM | POA: Diagnosis not present

## 2015-06-03 DIAGNOSIS — M150 Primary generalized (osteo)arthritis: Secondary | ICD-10-CM | POA: Diagnosis not present

## 2015-06-03 DIAGNOSIS — M2550 Pain in unspecified joint: Secondary | ICD-10-CM | POA: Diagnosis not present

## 2015-06-03 DIAGNOSIS — R5382 Chronic fatigue, unspecified: Secondary | ICD-10-CM | POA: Diagnosis not present

## 2015-06-03 DIAGNOSIS — G5602 Carpal tunnel syndrome, left upper limb: Secondary | ICD-10-CM | POA: Diagnosis not present

## 2015-06-03 DIAGNOSIS — M546 Pain in thoracic spine: Secondary | ICD-10-CM | POA: Diagnosis not present

## 2015-06-03 DIAGNOSIS — M1990 Unspecified osteoarthritis, unspecified site: Secondary | ICD-10-CM | POA: Diagnosis not present

## 2015-06-03 DIAGNOSIS — Z87891 Personal history of nicotine dependence: Secondary | ICD-10-CM | POA: Diagnosis not present

## 2015-06-08 ENCOUNTER — Inpatient Hospital Stay (HOSPITAL_COMMUNITY)
Admission: EM | Admit: 2015-06-08 | Discharge: 2015-06-13 | DRG: 391 | Disposition: A | Discharge status: Home Health | Payer: Medicare Other | Attending: Internal Medicine | Admitting: Internal Medicine

## 2015-06-08 ENCOUNTER — Inpatient Hospital Stay (HOSPITAL_COMMUNITY): Payer: Medicare Other

## 2015-06-08 ENCOUNTER — Emergency Department (HOSPITAL_COMMUNITY): Payer: Medicare Other

## 2015-06-08 ENCOUNTER — Encounter (HOSPITAL_COMMUNITY): Payer: Self-pay

## 2015-06-08 DIAGNOSIS — N390 Urinary tract infection, site not specified: Secondary | ICD-10-CM | POA: Diagnosis present

## 2015-06-08 DIAGNOSIS — R740 Nonspecific elevation of levels of transaminase and lactic acid dehydrogenase [LDH]: Secondary | ICD-10-CM | POA: Diagnosis present

## 2015-06-08 DIAGNOSIS — R1110 Vomiting, unspecified: Secondary | ICD-10-CM

## 2015-06-08 DIAGNOSIS — Z87891 Personal history of nicotine dependence: Secondary | ICD-10-CM | POA: Diagnosis not present

## 2015-06-08 DIAGNOSIS — K759 Inflammatory liver disease, unspecified: Secondary | ICD-10-CM | POA: Diagnosis present

## 2015-06-08 DIAGNOSIS — I10 Essential (primary) hypertension: Secondary | ICD-10-CM | POA: Diagnosis present

## 2015-06-08 DIAGNOSIS — E872 Acidosis, unspecified: Secondary | ICD-10-CM

## 2015-06-08 DIAGNOSIS — M4184 Other forms of scoliosis, thoracic region: Secondary | ICD-10-CM | POA: Diagnosis not present

## 2015-06-08 DIAGNOSIS — Z888 Allergy status to other drugs, medicaments and biological substances status: Secondary | ICD-10-CM | POA: Diagnosis not present

## 2015-06-08 DIAGNOSIS — E876 Hypokalemia: Secondary | ICD-10-CM | POA: Diagnosis present

## 2015-06-08 DIAGNOSIS — R7989 Other specified abnormal findings of blood chemistry: Secondary | ICD-10-CM

## 2015-06-08 DIAGNOSIS — I2510 Atherosclerotic heart disease of native coronary artery without angina pectoris: Secondary | ICD-10-CM | POA: Diagnosis not present

## 2015-06-08 DIAGNOSIS — E785 Hyperlipidemia, unspecified: Secondary | ICD-10-CM | POA: Diagnosis present

## 2015-06-08 DIAGNOSIS — G0491 Myelitis, unspecified: Secondary | ICD-10-CM | POA: Diagnosis present

## 2015-06-08 DIAGNOSIS — G0490 Encephalitis and encephalomyelitis, unspecified: Secondary | ICD-10-CM | POA: Diagnosis present

## 2015-06-08 DIAGNOSIS — Z9049 Acquired absence of other specified parts of digestive tract: Secondary | ICD-10-CM | POA: Diagnosis not present

## 2015-06-08 DIAGNOSIS — Z7902 Long term (current) use of antithrombotics/antiplatelets: Secondary | ICD-10-CM

## 2015-06-08 DIAGNOSIS — K2970 Gastritis, unspecified, without bleeding: Principal | ICD-10-CM | POA: Diagnosis present

## 2015-06-08 DIAGNOSIS — F419 Anxiety disorder, unspecified: Secondary | ICD-10-CM | POA: Diagnosis present

## 2015-06-08 DIAGNOSIS — G8929 Other chronic pain: Secondary | ICD-10-CM | POA: Diagnosis present

## 2015-06-08 DIAGNOSIS — R1013 Epigastric pain: Secondary | ICD-10-CM | POA: Diagnosis not present

## 2015-06-08 DIAGNOSIS — R52 Pain, unspecified: Secondary | ICD-10-CM

## 2015-06-08 DIAGNOSIS — R1011 Right upper quadrant pain: Secondary | ICD-10-CM

## 2015-06-08 DIAGNOSIS — D649 Anemia, unspecified: Secondary | ICD-10-CM | POA: Diagnosis present

## 2015-06-08 DIAGNOSIS — R109 Unspecified abdominal pain: Secondary | ICD-10-CM | POA: Diagnosis not present

## 2015-06-08 DIAGNOSIS — M797 Fibromyalgia: Secondary | ICD-10-CM | POA: Diagnosis present

## 2015-06-08 DIAGNOSIS — R10817 Generalized abdominal tenderness: Secondary | ICD-10-CM | POA: Diagnosis not present

## 2015-06-08 DIAGNOSIS — K449 Diaphragmatic hernia without obstruction or gangrene: Secondary | ICD-10-CM | POA: Diagnosis not present

## 2015-06-08 DIAGNOSIS — R112 Nausea with vomiting, unspecified: Secondary | ICD-10-CM | POA: Diagnosis not present

## 2015-06-08 DIAGNOSIS — Z881 Allergy status to other antibiotic agents status: Secondary | ICD-10-CM

## 2015-06-08 DIAGNOSIS — Z886 Allergy status to analgesic agent status: Secondary | ICD-10-CM

## 2015-06-08 DIAGNOSIS — Z9884 Bariatric surgery status: Secondary | ICD-10-CM | POA: Diagnosis not present

## 2015-06-08 DIAGNOSIS — M549 Dorsalgia, unspecified: Secondary | ICD-10-CM | POA: Diagnosis not present

## 2015-06-08 DIAGNOSIS — I252 Old myocardial infarction: Secondary | ICD-10-CM | POA: Diagnosis not present

## 2015-06-08 DIAGNOSIS — Z8249 Family history of ischemic heart disease and other diseases of the circulatory system: Secondary | ICD-10-CM | POA: Diagnosis not present

## 2015-06-08 DIAGNOSIS — K838 Other specified diseases of biliary tract: Secondary | ICD-10-CM | POA: Diagnosis not present

## 2015-06-08 DIAGNOSIS — E871 Hypo-osmolality and hyponatremia: Secondary | ICD-10-CM | POA: Diagnosis present

## 2015-06-08 DIAGNOSIS — K219 Gastro-esophageal reflux disease without esophagitis: Secondary | ICD-10-CM | POA: Diagnosis present

## 2015-06-08 DIAGNOSIS — M5033 Other cervical disc degeneration, cervicothoracic region: Secondary | ICD-10-CM | POA: Diagnosis not present

## 2015-06-08 DIAGNOSIS — F329 Major depressive disorder, single episode, unspecified: Secondary | ICD-10-CM | POA: Diagnosis present

## 2015-06-08 DIAGNOSIS — M9971 Connective tissue and disc stenosis of intervertebral foramina of cervical region: Secondary | ICD-10-CM | POA: Diagnosis not present

## 2015-06-08 DIAGNOSIS — Z791 Long term (current) use of non-steroidal anti-inflammatories (NSAID): Secondary | ICD-10-CM | POA: Diagnosis not present

## 2015-06-08 DIAGNOSIS — Z79899 Other long term (current) drug therapy: Secondary | ICD-10-CM | POA: Diagnosis not present

## 2015-06-08 DIAGNOSIS — Z90710 Acquired absence of both cervix and uterus: Secondary | ICD-10-CM | POA: Diagnosis not present

## 2015-06-08 DIAGNOSIS — M542 Cervicalgia: Secondary | ICD-10-CM

## 2015-06-08 DIAGNOSIS — R7401 Elevation of levels of liver transaminase levels: Secondary | ICD-10-CM | POA: Diagnosis present

## 2015-06-08 DIAGNOSIS — R0602 Shortness of breath: Secondary | ICD-10-CM

## 2015-06-08 DIAGNOSIS — R945 Abnormal results of liver function studies: Secondary | ICD-10-CM

## 2015-06-08 LAB — URINALYSIS, ROUTINE W REFLEX MICROSCOPIC
Bilirubin Urine: NEGATIVE
Glucose, UA: NEGATIVE mg/dL
Hgb urine dipstick: NEGATIVE
Ketones, ur: 15 mg/dL — AB
Leukocytes, UA: NEGATIVE
Nitrite: NEGATIVE
Protein, ur: NEGATIVE mg/dL
Specific Gravity, Urine: 1.01 (ref 1.005–1.030)
Urobilinogen, UA: 0.2 mg/dL (ref 0.0–1.0)
pH: 7.5 (ref 5.0–8.0)

## 2015-06-08 LAB — COMPREHENSIVE METABOLIC PANEL
ALT: 30 U/L (ref 14–54)
AST: 40 U/L (ref 15–41)
Albumin: 4.1 g/dL (ref 3.5–5.0)
Alkaline Phosphatase: 155 U/L — ABNORMAL HIGH (ref 38–126)
Anion gap: 12 (ref 5–15)
BUN: 14 mg/dL (ref 6–20)
CO2: 20 mmol/L — ABNORMAL LOW (ref 22–32)
Calcium: 9.1 mg/dL (ref 8.9–10.3)
Chloride: 105 mmol/L (ref 101–111)
Creatinine, Ser: 1.06 mg/dL — ABNORMAL HIGH (ref 0.44–1.00)
GFR calc Af Amer: >60 mL/min (ref >60)
GFR calc non Af Amer: 53 mL/min — ABNORMAL LOW (ref >60)
Glucose, Bld: 141 mg/dL — ABNORMAL HIGH (ref 65–99)
Potassium: 3.5 mmol/L (ref 3.5–5.1)
Sodium: 137 mmol/L (ref 135–145)
Total Bilirubin: 1 mg/dL (ref 0.3–1.2)
Total Protein: 7.5 g/dL (ref 6.5–8.1)

## 2015-06-08 LAB — CBC WITH DIFFERENTIAL/PLATELET
Basophils Absolute: 0 10*3/uL (ref 0.0–0.1)
Basophils Relative: 0 % (ref 0–1)
Eosinophils Absolute: 0.1 10*3/uL (ref 0.0–0.7)
Eosinophils Relative: 1 % (ref 0–5)
HCT: 38.3 % (ref 36.0–46.0)
Hemoglobin: 12.8 g/dL (ref 12.0–15.0)
Lymphocytes Relative: 18 % (ref 12–46)
Lymphs Abs: 2.3 10*3/uL (ref 0.7–4.0)
MCH: 29.8 pg (ref 26.0–34.0)
MCHC: 33.4 g/dL (ref 30.0–36.0)
MCV: 89.3 fL (ref 78.0–100.0)
Monocytes Absolute: 0.6 10*3/uL (ref 0.1–1.0)
Monocytes Relative: 5 % (ref 3–12)
Neutro Abs: 9.7 10*3/uL — ABNORMAL HIGH (ref 1.7–7.7)
Neutrophils Relative %: 76 % (ref 43–77)
Platelets: 305 10*3/uL (ref 150–400)
RBC: 4.29 MIL/uL (ref 3.87–5.11)
RDW: 13.3 % (ref 11.5–15.5)
WBC: 12.7 10*3/uL — ABNORMAL HIGH (ref 4.0–10.5)

## 2015-06-08 LAB — I-STAT TROPONIN, ED: Troponin i, poc: 0 ng/mL (ref 0.00–0.08)

## 2015-06-08 LAB — I-STAT CG4 LACTIC ACID, ED: Lactic Acid, Venous: 4.08 mmol/L (ref 0.5–2.0)

## 2015-06-08 LAB — LIPASE, BLOOD: Lipase: 26 U/L (ref 22–51)

## 2015-06-08 MED ORDER — IOHEXOL 300 MG/ML  SOLN
100.0000 mL | Freq: Once | INTRAMUSCULAR | Status: AC | PRN
  Administered 2015-06-08: 100 mL via INTRAVENOUS

## 2015-06-08 MED ORDER — HYDROMORPHONE HCL 1 MG/ML IJ SOLN: 1.0000 mg | INTRAMUSCULAR | Status: DC | PRN

## 2015-06-08 MED ORDER — ONDANSETRON HCL 4 MG/2ML IJ SOLN
4.0000 mg | Freq: Once | INTRAMUSCULAR | Status: AC
  Administered 2015-06-08: 4 mg via INTRAVENOUS
  Filled 2015-06-08: qty 2

## 2015-06-08 MED ORDER — HYDROMORPHONE HCL 1 MG/ML IJ SOLN
1.0000 mg | Freq: Once | INTRAMUSCULAR | Status: AC
  Administered 2015-06-08: 1 mg via INTRAVENOUS
  Filled 2015-06-08: qty 1

## 2015-06-08 MED ORDER — SODIUM CHLORIDE 0.9 % IV BOLUS (SEPSIS)
1000.0000 mL | Freq: Once | INTRAVENOUS | Status: AC
  Administered 2015-06-08: 1000 mL via INTRAVENOUS

## 2015-06-08 MED ORDER — SODIUM CHLORIDE 0.9 % IV SOLN: INTRAVENOUS | Status: DC

## 2015-06-08 MED ORDER — IOHEXOL 300 MG/ML  SOLN
25.0000 mL | Freq: Once | INTRAMUSCULAR | Status: AC | PRN
  Administered 2015-06-08: 25 mL via ORAL

## 2015-06-08 MED ORDER — GI COCKTAIL ~~LOC~~
30.0000 mL | Freq: Once | ORAL | Status: DC
  Filled 2015-06-08: qty 30

## 2015-06-08 MED ORDER — GADOBENATE DIMEGLUMINE 529 MG/ML IV SOLN
13.0000 mL | Freq: Once | INTRAVENOUS | Status: AC | PRN
  Administered 2015-06-08: 13 mL via INTRAVENOUS

## 2015-06-08 MED ORDER — ONDANSETRON HCL 4 MG/2ML IJ SOLN: 4.0000 mg | Freq: Three times a day (TID) | INTRAMUSCULAR | Status: DC | PRN

## 2015-06-08 MED ORDER — METOCLOPRAMIDE HCL 5 MG/ML IJ SOLN
10.0000 mg | Freq: Four times a day (QID) | INTRAMUSCULAR | Status: DC | PRN
  Administered 2015-06-09: 10 mg via INTRAVENOUS
  Filled 2015-06-08 (×4): qty 2

## 2015-06-08 MED ORDER — PANTOPRAZOLE SODIUM 40 MG IV SOLR
40.0000 mg | Freq: Once | INTRAVENOUS | Status: AC
  Administered 2015-06-08: 40 mg via INTRAVENOUS
  Filled 2015-06-08: qty 40

## 2015-06-08 NOTE — ED Notes (Signed)
Report attempted 

## 2015-06-08 NOTE — Progress Notes (Signed)
Attempted report x1. 

## 2015-06-08 NOTE — ED Provider Notes (Signed)
CSN: [id number]     Arrival date & time 06/08/15  1418 History   First MD Initiated Contact with Patient 06/08/15 1501     Chief Complaint  Patient presents with  . Abdominal Pain     (Consider location/radiation/quality/duration/timing/severity/associated sxs/prior Treatment) HPI Patient presents with severe abdominal pain.   History several hours prior to ED arrival the patient felt sudden onset of pain about the right infracostal area, as well as in the midthoracic posterior. Pain is severe, sharp, not improved with anything, including fentanyl provided by paramedics. There is associated nausea, fatigue, generalized discomfort, but no incontinence, falling, syncope. Patient states the pain is entirely different from typical reflux pain. Patient has a notable history of prior gastric bypass, abdominal hysterectomy. Notably, the patient also has chronic back pain, received epidural injection about one week ago in the midthoracic area. She describes paresthesia in both lower extremities, though inconsistently, and with no complete or present loss of sensation or strength.   Past Medical History  Diagnosis Date  . Hypertension   . Coronary artery disease   . Depression   . GERD (gastroesophageal reflux disease)   . Fibromyalgia   . Arthritis   . Anemia     from bypass- iron and B12  . Acute MI 11/2004    NONSTEMI 2005 NOINTERVENTION PER NOTE   . Post-menopausal   . Scoliosis   . Hyperlipidemia   . Obesity     hx of bipass, now resolved.  borderline diabetes also resolved.  Marland Kitchen GIB (gastrointestinal bleeding)     while on celecoxib post bypass  . Anxiety   . Hyperlipidemia LDL goal < 130 01/23/2014  . Statin intolerance 01/23/2014   Past Surgical History  Procedure Laterality Date  . Gastric bypass  01/2004  . Cardiac catheterization      2005 Severely disease small optional diagonal vessel responsible far wall motion abnormalities wise to small and diffusely diseased for  consideration of interventio.  . Colonoscopy    . Laparoscopic cholecystectomy  1993  . Total vaginal hysterectomy  1973    secondary to DUB - Dr. Connye Burkitt and Dr. Tamala Julian  . Knee arthroscopy Right   . Cataract extraction     Family History  Problem Relation Age of Onset  . Heart failure Mother   . Liver cancer Father   . Lung cancer Brother 41  . Cancer Brother   . Heart failure Maternal Grandmother   . Aneurysm Maternal Grandmother     ruptured abdominal aortic aneurysm   History  Substance Use Topics  . Smoking status: Former Smoker -- 0.25 packs/day for 5 years    Types: Cigarettes    Quit date: 12/18/1996  . Smokeless tobacco: Never Used  . Alcohol Use: 0.0 oz/week    1-2 Glasses of wine per week     Comment: occasional alcohol, wine.     OB History    Gravida Para Term Preterm AB TAB SAB Ectopic Multiple Living   2 2              Obstetric Comments   Adopted a daughter at age 22  1981-02-03.  Died at age 46 from murder by husband. She had 2 children. The patient has raised the children.     Review of Systems  Constitutional:       Per HPI, otherwise negative  HENT:       Per HPI, otherwise negative  Respiratory:       Per HPI, otherwise  negative  Cardiovascular:       Per HPI, otherwise negative  Gastrointestinal: Positive for vomiting and abdominal pain.  Endocrine:       Negative aside from HPI  Genitourinary:       Neg aside from HPI   Musculoskeletal:       Per HPI, otherwise negative  Skin: Negative.   Neurological: Negative for syncope.      Allergies  Imodium; Aspirin; and Erythromycin  Home Medications   Prior to Admission medications   Medication Sig Start Date End Date Taking? Authorizing Provider  acetaminophen (TYLENOL) 650 MG CR tablet Take 650 mg by mouth as needed. 12/03/14   Historical Provider, MD  ALPRAZolam Duanne Moron) 0.5 MG tablet Take 0.5 mg by mouth daily as needed for anxiety.     Historical Provider, MD  amLODipine (NORVASC) 5 MG  tablet Take 5 mg by mouth daily with breakfast.    Historical Provider, MD  clopidogrel (PLAVIX) 75 MG tablet TAKE 1 TABLET BY MOUTH EVERY DAY 03/22/15   Pixie Casino, MD  diclofenac sodium (VOLTAREN) 1 % GEL as needed. 10/30/14   Historical Provider, MD  enalapril (VASOTEC) 5 MG tablet Take 5 mg by mouth daily with breakfast.    Historical Provider, MD  escitalopram (LEXAPRO) 10 MG tablet Take 5 mg by mouth daily. 01/08/14   Historical Provider, MD  estradiol (ESTRACE) 0.1 MG/GM vaginal cream Use 1/2 g vaginally twice weekly 06/16/14   Kem Boroughs, FNP  gabapentin (NEURONTIN) 300 MG capsule Take 300 mg by mouth daily. 12/03/14   Historical Provider, MD  Multiple Vitamin (MULTIVITAMIN WITH MINERALS) TABS Take 2 tablets by mouth daily.    Historical Provider, MD  nitroGLYCERIN (NITROSTAT) 0.4 MG SL tablet Place 1 tablet (0.4 mg total) under the tongue every 5 (five) minutes as needed for chest pain. 01/05/14   Janece Canterbury, MD  pantoprazole (PROTONIX) 40 MG tablet Take 40 mg by mouth daily with breakfast.    Historical Provider, MD  Vitamin D, Ergocalciferol, (DRISDOL) 50000 UNITS CAPS Take 50,000 Units by mouth 3 (three) times a week.    Historical Provider, MD   BP 155/65 mmHg  Pulse 88  Temp(Src) 97.8 F (36.6 C) (Oral)  Resp 18  SpO2 96% Physical Exam  Constitutional: She is oriented to person, place, and time. She appears well-developed and well-nourished. No distress.  Uncomfortable appearing diaphoretic elderly female awake, alert, answering all questions appropriately  HENT:  Head: Normocephalic and atraumatic.  Eyes: Conjunctivae and EOM are normal.  Cardiovascular: Normal rate and regular rhythm.   Pulmonary/Chest: Effort normal and breath sounds normal. No stridor. No respiratory distress.  Abdominal: She exhibits no distension. There is tenderness in the right upper quadrant. There is guarding. There is no rigidity.  Musculoskeletal: She exhibits no edema.  Neurological:  She is alert and oriented to person, place, and time. No cranial nerve deficit. She exhibits normal muscle tone. Coordination normal.  Skin: Skin is warm and dry.  Psychiatric: She has a normal mood and affect.  Nursing note and vitals reviewed.   ED Course  Procedures (including critical care time) Labs Review Labs Reviewed  CBC WITH DIFFERENTIAL/PLATELET - Abnormal; Notable for the following:    WBC 12.7 (*)    Neutro Abs 9.7 (*)    All other components within normal limits  COMPREHENSIVE METABOLIC PANEL - Abnormal; Notable for the following:    CO2 20 (*)    Glucose, Bld 141 (*)    Creatinine, Ser  1.06 (*)    Alkaline Phosphatase 155 (*)    GFR calc non Af Amer 53 (*)    All other components within normal limits  URINALYSIS, ROUTINE W REFLEX MICROSCOPIC (NOT AT [facility]) - Abnormal; Notable for the following:    Ketones, ur 15 (*)    All other components within normal limits  I-STAT CG4 LACTIC ACID, ED - Abnormal; Notable for the following:    Lactic Acid, Venous 4.08 (*)    All other components within normal limits  CULTURE, BLOOD (ROUTINE X 2)  CULTURE, BLOOD (ROUTINE X 2)  LIPASE, BLOOD  CBC WITH DIFFERENTIAL/PLATELET  COMPREHENSIVE METABOLIC PANEL  LACTIC ACID, PLASMA  LACTIC ACID, PLASMA  PROCALCITONIN  PROTIME-INR  APTT  I-STAT TROPOININ, ED  I-STAT CG4 LACTIC ACID, ED    Imaging Review Mr Thoracic Spine W Wo Contrast  06/08/2015   CLINICAL DATA:  Patient experienced sudden onset of RIGHT upper quadrant abdominal pain radiating to the RIGHT shoulder in middle of the back. This occurred earlier today.  EXAM: MRI THORACIC SPINE WITHOUT AND WITH CONTRAST  TECHNIQUE: Multiplanar and multiecho pulse sequences of the thoracic spine were obtained without and with intravenous contrast.  CONTRAST:  1mL MULTIHANCE GADOBENATE DIMEGLUMINE 529 MG/ML IV SOLN  COMPARISON:  CT abdomen and pelvis 06/08/2015.  CT chest 01/03/2014.  FINDINGS: Numbering of the thoracic spine was  accomplished by counting down from the odontoid.  There is a greater than 10 degrees scoliosis convex RIGHT centered in the mid thoracic region. There is no worrisome vertebral body abnormality. Paravertebral soft tissues are unremarkable. No thoracic disc protrusion, but there is a tiny protrusion at C7-T1 which appears noncompressive.  In the upper thoracic region, the cord is mildly displaced RIGHT to LEFT. There is a possible intradural extramedullary 5 mm rounded structure at the T4 level which could contribute to mild RIGHT to LEFT cord displacement. While it is possible this represents flow artifact, on most of the images, it appears to be a real finding. There is slight T2 hyperintensity of the cord opposite T3 and T4, without cord enlargement. Significance is uncertain. No peripheral cord hyperintensity to suggest demyelinating lesion. No hydromyelia is evident or apparent tonsillar descent. Post infusion images do not reveal abnormal enhancement intraspinal contents or cord.  Note is made of marked esophageal enlargement in the mid thoracic region, with an air-fluid level. See for instance image 18 of multiple axial series. Considerations for this appearance include gastroesophageal reflux or distal esophageal stricture. This could contribute to chest pain.  IMPRESSION: Mild scoliosis convex RIGHT centered in mid thoracic region.  Possible intradural extramedullary 5 mm rounded structure at the T4 level which could contribute to slight cord displacement. This likely represents a benign incidental long-standing structure such as a small nonenhancing meningioma or even intradural synovial cyst.  Slight cord T2 hyperintensity opposite T3 and T4 without enlargement or enhancement, of uncertain significance. Neoplasm, myelitis, demyelinating disease, or remote insult are considerations. It is unlikely this represents an acute phenomenon.  Marked esophageal enlargement in the mid thoracic lesion with an  air-fluid level. Considerations include GE reflux or distal esophageal stricture.   Electronically Signed   By: Staci Righter M.D.   On: 06/08/2015 21:32   Ct Abdomen Pelvis W Contrast  06/08/2015   CLINICAL DATA:  Abdominal pain. Sudden onset of RIGHT upper quadrant pain radiating to the back and shoulder. Surgical history of gastric bypass.  EXAM: CT ABDOMEN AND PELVIS WITH CONTRAST  TECHNIQUE: Multidetector CT  imaging of the abdomen and pelvis was performed using the standard protocol following bolus administration of intravenous contrast.  CONTRAST:  152mL OMNIPAQUE IOHEXOL 300 MG/ML  SOLN  COMPARISON:  MRCP 04/13/2014.  FINDINGS: Musculoskeletal: No aggressive osseous lesions. Benign hemangioma in the L4 vertebra. Scattered Schmorl's nodes. Thoracolumbar vertebral body height is preserved. RIGHT L5-S1 severe facet arthrosis.  Lung Bases: Respiratory motion artifact. Linear scarring or atelectasis in the lingula.  Liver: Mild intrahepatic biliary ductal dilation appears similar to previous MRCP and CT abdomen. No mass lesion.  Spleen:  Normal.  Gallbladder:  Cholecystectomy.  Common bile duct: Postcholecystectomy dilation of the biliary system. No calcified common duct stone is identified. No changed and common bile duct diameter.  Pancreas:  Normal.  Adrenal glands:  Normal bilaterally.  Kidneys: Normal enhancement and excretion of contrast. Tiny low-density lesion in the interpolar LEFT kidney likely representing a small cyst. LEFT ureter normal. RIGHT ureter normal.  Stomach: Small hiatal hernia. Postsurgical changes of gastric bypass. Normal opacification of the gastrojejunostomy with oral contrast.  Small bowel:  No small bowel obstruction or complicating features.  Colon: High position of the cecum. Colon appears within normal limits.  Pelvic Genitourinary: Distended urinary bladder. Hysterectomy. Laxity of the pelvic floor.  Peritoneum: No free air or free fluid.  Vascular/lymphatic: Mild  atherosclerosis. No acute vascular abnormality.  Body Wall: Normal.  IMPRESSION: 1. Uncomplicated gastric bypass. 2. Small hiatal hernia. 3. Cholecystectomy with chronic postcholecystectomy the intra and extrahepatic biliary ductal dilation. No interval change. 4. Hysterectomy.   Electronically Signed   By: Dereck Ligas M.D.   On: 06/08/2015 17:24     EKG Interpretation   Date/Time:  Tuesday June 08 2015 14:19:05 EDT Ventricular Rate:  73 PR Interval:  169 QRS Duration: 67 QT Interval:  420 QTC Calculation: 463 R Axis:   61 Text Interpretation:  Sinus rhythm Left atrial enlargement No significant  change since last tracing Confirmed by DOCHERTY  MD, MEGAN ([id number]) on  06/08/2015 2:26:51 PM     5:48 PM Patient remains in pain. Initial results d/w her and her husband.  She now states that the pain is most severe in her intra-thoracic area.  10:51 PM On return from MRI the patient remains symptomatic. Patient has now received at least 4 rounds of analgesia, antiemetics She remains symptomatic. She, and multiple family members and I discussed all findings, including the likelihood of esophageal etiology. However, the patient has been intolerant of oral intake since arrival. She will be admitted for further evaluation and management. MDM   Final diagnoses:  Pain    This patient with history of thoracic pain and gastroesophageal disease now presents with acute worsening of her pain earlier today. Notably, the patient describes both occasional paresthesia in her lower extremities, as well as intrascapular pain. There is suspicion for both epidural processes and possible gastro-esophageal etiology. Patient's neurologic exam is unremarkable, she was ultimately spontaneously, has symmetric strength However, given her consistent complaints of intrascapular pain, recent procedure, MRI was performed. This demonstrated likely chronic changes, and given the patient's reassuring neurologic  exam, there is low suspicion for acute new pathology. No evidence for epidural hematoma. Patient did not have sustained relief from pain or nausea, in spite of multiple IV boluses of medication. Patient's CT scan was largely reassuring. Patient takes Plavix, and without focal chest pain, with reassuring EKG, negative troponin, there is low suspicion for atypical ACS. Given the persistent nausea, vomiting, pain, patient required admission for further evaluation and  management.    Carmin Muskrat, MD 06/08/15 [phone]

## 2015-06-08 NOTE — ED Notes (Signed)
Dr. Posey Pronto paged. Patient vitals changed from q 2 hours to q 4 hours.

## 2015-06-08 NOTE — ED Notes (Signed)
Patient vomited twice with dry heaving. Dr. Vanita Panda informed.

## 2015-06-08 NOTE — ED Notes (Signed)
Provider aware of pt request for pain medication.

## 2015-06-08 NOTE — ED Notes (Signed)
Per EMS - pt driving in car with family when sudden onset of RUQ abd pain radiating to right shoulder and middle of back. Family tried giving pt 1 nitro w/ no relief prior to EMS arrival. Pt c/o 10/10 pain, some relief with 100 mcg fentanyl. Pain currently 10/10. IV 20g RAC. Pt cool to touch. Still has gallbladder. Hx gastric bypass and HTN. Takes plavix. Pt had epidural last Thursday for nerve issue. C/o neck pain. 12-lead unremarkable, 100% RA, BP 180/90, hr 70bpm.

## 2015-06-08 NOTE — ED Notes (Signed)
Patient returned from CT

## 2015-06-09 ENCOUNTER — Inpatient Hospital Stay (HOSPITAL_COMMUNITY): Payer: Medicare Other

## 2015-06-09 ENCOUNTER — Encounter (HOSPITAL_COMMUNITY): Payer: Self-pay | Admitting: *Deleted

## 2015-06-09 DIAGNOSIS — R740 Nonspecific elevation of levels of transaminase and lactic acid dehydrogenase [LDH]: Secondary | ICD-10-CM

## 2015-06-09 DIAGNOSIS — R112 Nausea with vomiting, unspecified: Secondary | ICD-10-CM | POA: Diagnosis present

## 2015-06-09 DIAGNOSIS — I2510 Atherosclerotic heart disease of native coronary artery without angina pectoris: Secondary | ICD-10-CM

## 2015-06-09 DIAGNOSIS — K759 Inflammatory liver disease, unspecified: Secondary | ICD-10-CM

## 2015-06-09 DIAGNOSIS — R1110 Vomiting, unspecified: Secondary | ICD-10-CM

## 2015-06-09 DIAGNOSIS — R1013 Epigastric pain: Secondary | ICD-10-CM

## 2015-06-09 DIAGNOSIS — G0491 Myelitis, unspecified: Secondary | ICD-10-CM | POA: Diagnosis present

## 2015-06-09 DIAGNOSIS — I10 Essential (primary) hypertension: Secondary | ICD-10-CM

## 2015-06-09 LAB — COMPREHENSIVE METABOLIC PANEL
ALT: 232 U/L — ABNORMAL HIGH (ref 14–54)
ALT: 294 U/L — ABNORMAL HIGH (ref 14–54)
AST: 321 U/L — ABNORMAL HIGH (ref 15–41)
AST: 871 U/L — ABNORMAL HIGH (ref 15–41)
Albumin: 4 g/dL (ref 3.5–5.0)
Albumin: 4.3 g/dL (ref 3.5–5.0)
Alkaline Phosphatase: 347 U/L — ABNORMAL HIGH (ref 38–126)
Alkaline Phosphatase: 382 U/L — ABNORMAL HIGH (ref 38–126)
Anion gap: 13 (ref 5–15)
Anion gap: 13 (ref 5–15)
BUN: 10 mg/dL (ref 6–20)
BUN: 9 mg/dL (ref 6–20)
CO2: 22 mmol/L (ref 22–32)
CO2: 24 mmol/L (ref 22–32)
Calcium: 9.3 mg/dL (ref 8.9–10.3)
Calcium: 9.3 mg/dL (ref 8.9–10.3)
Chloride: 101 mmol/L (ref 101–111)
Chloride: 96 mmol/L — ABNORMAL LOW (ref 101–111)
Creatinine, Ser: 0.76 mg/dL (ref 0.44–1.00)
Creatinine, Ser: 0.83 mg/dL (ref 0.44–1.00)
GFR calc Af Amer: >60 mL/min (ref >60)
GFR calc Af Amer: >60 mL/min (ref >60)
GFR calc non Af Amer: >60 mL/min (ref >60)
GFR calc non Af Amer: >60 mL/min (ref >60)
Glucose, Bld: 136 mg/dL — ABNORMAL HIGH (ref 65–99)
Glucose, Bld: 184 mg/dL — ABNORMAL HIGH (ref 65–99)
Potassium: 3.8 mmol/L (ref 3.5–5.1)
Potassium: 4 mmol/L (ref 3.5–5.1)
Sodium: 133 mmol/L — ABNORMAL LOW (ref 135–145)
Sodium: 136 mmol/L (ref 135–145)
Total Bilirubin: 1.4 mg/dL — ABNORMAL HIGH (ref 0.3–1.2)
Total Bilirubin: 1.6 mg/dL — ABNORMAL HIGH (ref 0.3–1.2)
Total Protein: 7.5 g/dL (ref 6.5–8.1)
Total Protein: 7.7 g/dL (ref 6.5–8.1)

## 2015-06-09 LAB — CBC WITH DIFFERENTIAL/PLATELET
Basophils Absolute: 0 10*3/uL (ref 0.0–0.1)
Basophils Absolute: 0 10*3/uL (ref 0.0–0.1)
Basophils Relative: 0 % (ref 0–1)
Basophils Relative: 0 % (ref 0–1)
Eosinophils Absolute: 0 10*3/uL (ref 0.0–0.7)
Eosinophils Absolute: 0 10*3/uL (ref 0.0–0.7)
Eosinophils Relative: 0 % (ref 0–5)
Eosinophils Relative: 0 % (ref 0–5)
HCT: 37.4 % (ref 36.0–46.0)
HCT: 38.8 % (ref 36.0–46.0)
Hemoglobin: 12.3 g/dL (ref 12.0–15.0)
Hemoglobin: 13.1 g/dL (ref 12.0–15.0)
Lymphocytes Relative: 4 % — ABNORMAL LOW (ref 12–46)
Lymphocytes Relative: 7 % — ABNORMAL LOW (ref 12–46)
Lymphs Abs: 0.7 10*3/uL (ref 0.7–4.0)
Lymphs Abs: 1.2 10*3/uL (ref 0.7–4.0)
MCH: 29.6 pg (ref 26.0–34.0)
MCH: 30 pg (ref 26.0–34.0)
MCHC: 32.9 g/dL (ref 30.0–36.0)
MCHC: 33.8 g/dL (ref 30.0–36.0)
MCV: 88.8 fL (ref 78.0–100.0)
MCV: 90.1 fL (ref 78.0–100.0)
Monocytes Absolute: 0.5 10*3/uL (ref 0.1–1.0)
Monocytes Absolute: 0.8 10*3/uL (ref 0.1–1.0)
Monocytes Relative: 3 % (ref 3–12)
Monocytes Relative: 4 % (ref 3–12)
Neutro Abs: 15.2 10*3/uL — ABNORMAL HIGH (ref 1.7–7.7)
Neutro Abs: 15.4 10*3/uL — ABNORMAL HIGH (ref 1.7–7.7)
Neutrophils Relative %: 89 % — ABNORMAL HIGH (ref 43–77)
Neutrophils Relative %: 93 % — ABNORMAL HIGH (ref 43–77)
Platelets: 279 10*3/uL (ref 150–400)
Platelets: 330 10*3/uL (ref 150–400)
RBC: 4.15 MIL/uL (ref 3.87–5.11)
RBC: 4.37 MIL/uL (ref 3.87–5.11)
RDW: 13.2 % (ref 11.5–15.5)
RDW: 13.5 % (ref 11.5–15.5)
WBC: 16.7 10*3/uL — ABNORMAL HIGH (ref 4.0–10.5)
WBC: 17.2 10*3/uL — ABNORMAL HIGH (ref 4.0–10.5)

## 2015-06-09 LAB — ACETAMINOPHEN LEVEL
Acetaminophen (Tylenol), Serum: <10 ug/mL — ABNORMAL LOW (ref 10–30)
Acetaminophen (Tylenol), Serum: <10 ug/mL — ABNORMAL LOW (ref 10–30)

## 2015-06-09 LAB — GAMMA GT: GGT: 171 U/L — ABNORMAL HIGH (ref 7–50)

## 2015-06-09 LAB — I-STAT CG4 LACTIC ACID, ED: Lactic Acid, Venous: 2.55 mmol/L (ref 0.5–2.0)

## 2015-06-09 LAB — LIPASE, BLOOD: Lipase: 23 U/L (ref 22–51)

## 2015-06-09 LAB — PROTIME-INR
INR: 1.01 (ref 0.00–1.49)
INR: 1.05 (ref 0.00–1.49)
Prothrombin Time: 13.5 seconds (ref 11.6–15.2)
Prothrombin Time: 13.9 seconds (ref 11.6–15.2)

## 2015-06-09 LAB — PROCALCITONIN: Procalcitonin: 0.22 ng/mL

## 2015-06-09 LAB — SEDIMENTATION RATE: Sed Rate: 27 mm/hr — ABNORMAL HIGH (ref 0–22)

## 2015-06-09 LAB — LACTIC ACID, PLASMA
Lactic Acid, Venous: 2.7 mmol/L (ref 0.5–2.0)
Lactic Acid, Venous: 4.4 mmol/L (ref 0.5–2.0)

## 2015-06-09 LAB — APTT: aPTT: 26 seconds (ref 24–37)

## 2015-06-09 LAB — C-REACTIVE PROTEIN: CRP: 1.4 mg/dL — ABNORMAL HIGH (ref <1.0)

## 2015-06-09 MED ORDER — ACETAMINOPHEN 325 MG PO TABS: 650.0000 mg | ORAL_TABLET | Freq: Four times a day (QID) | ORAL | Status: DC | PRN

## 2015-06-09 MED ORDER — SODIUM CHLORIDE 0.9 % IJ SOLN
3.0000 mL | Freq: Two times a day (BID) | INTRAMUSCULAR | Status: DC
  Administered 2015-06-09 – 2015-06-13 (×10): 3 mL via INTRAVENOUS

## 2015-06-09 MED ORDER — ACETAMINOPHEN 650 MG RE SUPP: 650.0000 mg | Freq: Four times a day (QID) | RECTAL | Status: DC | PRN

## 2015-06-09 MED ORDER — CLOPIDOGREL BISULFATE 75 MG PO TABS
75.0000 mg | ORAL_TABLET | Freq: Every day | ORAL | Status: DC
  Administered 2015-06-10 – 2015-06-13 (×4): 75 mg via ORAL
  Filled 2015-06-09 (×5): qty 1

## 2015-06-09 MED ORDER — ENOXAPARIN SODIUM 40 MG/0.4ML ~~LOC~~ SOLN
40.0000 mg | SUBCUTANEOUS | Status: DC
  Administered 2015-06-10 – 2015-06-12 (×3): 40 mg via SUBCUTANEOUS
  Filled 2015-06-09 (×5): qty 0.4

## 2015-06-09 MED ORDER — HYDRALAZINE HCL 20 MG/ML IJ SOLN
10.0000 mg | INTRAMUSCULAR | Status: DC | PRN
  Administered 2015-06-09 – 2015-06-10 (×3): 10 mg via INTRAVENOUS
  Filled 2015-06-09 (×2): qty 1

## 2015-06-09 MED ORDER — HYDROMORPHONE HCL 1 MG/ML IJ SOLN
0.5000 mg | INTRAMUSCULAR | Status: DC | PRN
  Administered 2015-06-09 – 2015-06-11 (×8): 0.5 mg via INTRAVENOUS
  Filled 2015-06-09 (×8): qty 1

## 2015-06-09 MED ORDER — OXYCODONE HCL 5 MG PO TABS
5.0000 mg | ORAL_TABLET | Freq: Four times a day (QID) | ORAL | Status: DC | PRN
  Administered 2015-06-10 – 2015-06-13 (×8): 10 mg via ORAL
  Filled 2015-06-09 (×10): qty 2

## 2015-06-09 MED ORDER — SODIUM CHLORIDE 0.9 % IV SOLN
INTRAVENOUS | Status: DC
  Administered 2015-06-09 – 2015-06-11 (×3): via INTRAVENOUS

## 2015-06-09 MED ORDER — HYDRALAZINE HCL 20 MG/ML IJ SOLN
INTRAMUSCULAR | Status: AC
  Filled 2015-06-09: qty 1

## 2015-06-09 MED ORDER — AMLODIPINE BESYLATE 5 MG PO TABS
5.0000 mg | ORAL_TABLET | Freq: Every day | ORAL | Status: DC
  Administered 2015-06-09 – 2015-06-13 (×5): 5 mg via ORAL
  Filled 2015-06-09 (×5): qty 1

## 2015-06-09 MED ORDER — SODIUM CHLORIDE 0.9 % IV BOLUS (SEPSIS)
1000.0000 mL | Freq: Once | INTRAVENOUS | Status: AC
  Administered 2015-06-09: 1000 mL via INTRAVENOUS

## 2015-06-09 MED ORDER — LORAZEPAM 2 MG/ML IJ SOLN
1.0000 mg | INTRAMUSCULAR | Status: DC | PRN
Start: 2015-06-09 — End: 2015-06-13
  Administered 2015-06-10: 1 mg via INTRAVENOUS
  Filled 2015-06-09 (×2): qty 1

## 2015-06-09 MED ORDER — LORAZEPAM 2 MG/ML IJ SOLN
INTRAMUSCULAR | Status: AC
  Administered 2015-06-09: 14:00:00
  Filled 2015-06-09: qty 1

## 2015-06-09 MED ORDER — PANTOPRAZOLE SODIUM 40 MG IV SOLR
40.0000 mg | Freq: Two times a day (BID) | INTRAVENOUS | Status: DC
  Administered 2015-06-09 – 2015-06-13 (×10): 40 mg via INTRAVENOUS
  Filled 2015-06-09 (×11): qty 40

## 2015-06-09 MED ORDER — OXYCODONE-ACETAMINOPHEN 5-325 MG PO TABS
1.0000 | ORAL_TABLET | Freq: Four times a day (QID) | ORAL | Status: DC | PRN
  Filled 2015-06-09: qty 1

## 2015-06-09 MED ORDER — METHOCARBAMOL 500 MG PO TABS
500.0000 mg | ORAL_TABLET | Freq: Three times a day (TID) | ORAL | Status: DC | PRN
  Administered 2015-06-09 – 2015-06-12 (×3): 500 mg via ORAL
  Filled 2015-06-09 (×6): qty 1

## 2015-06-09 MED ORDER — SODIUM CHLORIDE 0.9 % IV BOLUS (SEPSIS)
1000.0000 mL | Freq: Once | INTRAVENOUS | Status: DC
Start: 2015-06-09 — End: 2015-06-09

## 2015-06-09 MED ORDER — DIAZEPAM 5 MG PO TABS
5.0000 mg | ORAL_TABLET | Freq: Three times a day (TID) | ORAL | Status: DC | PRN
  Administered 2015-06-09: 5 mg via ORAL
  Filled 2015-06-09 (×2): qty 1

## 2015-06-09 NOTE — Progress Notes (Signed)
UR completed.    Maryn Freelove W. Kassius Battiste, RN, BSN  Trauma/Neuro ICU Case Manager [phone] 

## 2015-06-09 NOTE — H&P (Signed)
[facility] History and Physical  Patient: Kimberly Stone  MRN: [id number]  DOB: 02-04-1946  DOS: the patient was seen and examined on 06/08/2015 PCP: Jerlyn Ly, MD  Referring physician:  Dr Vanita Panda Chief Complaint: abdominal pain and back pain  HPI: Kimberly Stone is a 69 y.o. female with Past medical history of hypertension, coronary artery disease, status post gastric bypass and cholecystectomy, dyslipidemia, dilated CBD. The patient is presenting with complaints of abdominal pain which was located on the right upper abdomen associated with back pain which was located paraspinal all started today. Patient mentions she has chronic back pain as well as chronic neck pain which has been ongoing since last few month and she has been following up with her PCP and was recently seen by orthopedics who has decided to do an epidural injection for her for pain management. She also has been complaining of pain in her lower ribs bilaterally in a bandlike fashion ongoing since last few weeks occurring on and off.  Today while she was trying to eat she suddenly started having complaints of pain on the right side of her stomach starting from her back and then radiating all the way to the front. She also started having nausea and vomiting. With this she was severely in pain was incapacitated and therefore brought to the hospital. The pain she describes is a sharp pain and 10 out of 10 in intensity. She has been in the hospital since many hours and has been given multiple pain medication despite which her symptoms have not improved. She continues to have vomiting with nonbloody bilious material. She denies any diarrhea or constipation she denies any burning urination she denies any fever or chills at home she denies any cough she denies any recent travel as he denies any recent outside food she denies any rash anywhere she denies any new medication on her. She denies having similar symptoms in  the past. She denies any sick contact. She denies any smoking she denies any alcohol abuse. She denies any Tylenol abuse.  The patient is coming from home. And at her baseline independent for most of her ADL.  Review of Systems: as mentioned in the history of present illness.  A comprehensive review of the other systems is negative.  Past Medical History  Diagnosis Date  . Hypertension   . Coronary artery disease   . Depression   . GERD (gastroesophageal reflux disease)   . Fibromyalgia   . Arthritis   . Anemia     from bypass- iron and B12  . Acute MI 11/2004    NONSTEMI 2005 NOINTERVENTION PER NOTE   . Post-menopausal   . Scoliosis   . Hyperlipidemia   . Obesity     hx of bipass, now resolved.  borderline diabetes also resolved.  Marland Kitchen GIB (gastrointestinal bleeding)     while on celecoxib post bypass  . Anxiety   . Hyperlipidemia LDL goal < 130 01/23/2014  . Statin intolerance 01/23/2014   Past Surgical History  Procedure Laterality Date  . Gastric bypass  01/2004  . Cardiac catheterization      2005 Severely disease small optional diagonal vessel responsible far wall motion abnormalities wise to small and diffusely diseased for consideration of interventio.  . Colonoscopy    . Laparoscopic cholecystectomy  1993  . Total vaginal hysterectomy  1973    secondary to DUB - Dr. Connye Burkitt and Dr. Tamala Julian  . Knee arthroscopy Right   . Cataract extraction  Social History:  reports that she quit smoking about 18 years ago. Her smoking use included Cigarettes. She has a 1.25 pack-year smoking history. She has never used smokeless tobacco. She reports that she drinks alcohol. She reports that she does not use illicit drugs.  Allergies  Allergen Reactions  . Aspirin Other (See Comments)    PT CANNOT TAKE DUE TO HX OF GASTRIC BYPASS SURGERY   . Imodium [Loperamide] Palpitations  . Erythromycin Other (See Comments)    UNKNOWN    Family History  Problem Relation Age of Onset  .  Heart failure Mother   . Liver cancer Father   . Lung cancer Brother 34  . Cancer Brother   . Heart failure Maternal Grandmother   . Aneurysm Maternal Grandmother     ruptured abdominal aortic aneurysm    Prior to Admission medications   Medication Sig Start Date End Date Taking? Authorizing Provider  acetaminophen (TYLENOL) 650 MG CR tablet Take 650 mg by mouth as needed for pain.  12/03/14  Yes Historical Provider, MD  amLODipine (NORVASC) 5 MG tablet Take 5 mg by mouth daily with breakfast.   Yes Historical Provider, MD  clopidogrel (PLAVIX) 75 MG tablet TAKE 1 TABLET BY MOUTH EVERY DAY 03/22/15  Yes Pixie Casino, MD  diclofenac sodium (VOLTAREN) 1 % GEL as needed. 10/30/14  Yes Historical Provider, MD  diclofenac sodium (VOLTAREN) 1 % GEL Apply 1 application topically 4 (four) times daily as needed. FOR BACK PAIN 06/03/15  Yes Historical Provider, MD  enalapril (VASOTEC) 5 MG tablet Take 5 mg by mouth daily with breakfast.   Yes Historical Provider, MD  escitalopram (LEXAPRO) 10 MG tablet Take 10 mg by mouth daily.  01/08/14  Yes Historical Provider, MD  gabapentin (NEURONTIN) 300 MG capsule Take 300 mg by mouth at bedtime. 06/03/15  Yes Historical Provider, MD  Multiple Vitamin (MULTIVITAMIN WITH MINERALS) TABS Take 2 tablets by mouth daily.   Yes Historical Provider, MD  nitroGLYCERIN (NITROSTAT) 0.4 MG SL tablet Place 1 tablet (0.4 mg total) under the tongue every 5 (five) minutes as needed for chest pain. 01/05/14  Yes Janece Canterbury, MD  pantoprazole (PROTONIX) 40 MG tablet Take 40 mg by mouth daily with breakfast.   Yes Historical Provider, MD  PRESCRIPTION MEDICATION Inject 1 application into the muscle every 14 (fourteen) days. "REPARTHA" INJECTION   Yes Historical Provider, MD  Vitamin D, Ergocalciferol, (DRISDOL) 50000 UNITS CAPS Take 50,000 Units by mouth every Monday, Wednesday, and Friday.    Yes Historical Provider, MD  estradiol (ESTRACE) 0.1 MG/GM vaginal cream Use 1/2 g  vaginally twice weekly 06/16/14   Kem Boroughs, FNP    Physical Exam: Filed Vitals:   06/08/15 2145 06/08/15 2215 06/08/15 2230 06/09/15 0057  BP: 171/90 160/78 159/93 173/88  Pulse:  88 100 89  Temp:    98.2 F (36.8 C)  TempSrc:    Oral  Resp:  14 23 18   Height:    5\' 2"  (1.575 m)  Weight:    57.97 kg (127 lb 12.8 oz)  SpO2: 99% 96% 97% 100%    General: Alert, Awake and Oriented to Time, Place and Person. Appear in moderate distress Eyes: PERRL ENT: Oral Mucosa clear moist. Neck: no JVD Cardiovascular: S1 and S2 Present, no Murmur, Peripheral Pulses Present Respiratory: Bilateral Air entry equal and Decreased,  Clear to Auscultation, no Crackles, no wheezes Abdomen: Bowel Sound present, Soft and non tender Skin: no Rash Extremities: no Pedal edema, no  calf tenderness Neurologic: Grossly no focal neuro deficit.  Labs on Admission:  CBC:  Recent Labs Lab 06/08/15 1438 06/08/15 2359  WBC 12.7* 16.7*  NEUTROABS 9.7* 15.4*  HGB 12.8 13.1  HCT 38.3 38.8  MCV 89.3 88.8  PLT 305 279    CMP     Component Value Date/Time   NA 133* 06/08/2015 2359   K 3.8 06/08/2015 2359   CL 96* 06/08/2015 2359   CO2 24 06/08/2015 2359   GLUCOSE 184* 06/08/2015 2359   BUN 10 06/08/2015 2359   CREATININE 0.83 06/08/2015 2359   CALCIUM 9.3 06/08/2015 2359   PROT 7.7 06/08/2015 2359   ALBUMIN 4.3 06/08/2015 2359   AST 871* 06/08/2015 2359   ALT 294* 06/08/2015 2359   ALKPHOS 382* 06/08/2015 2359   BILITOT 1.6* 06/08/2015 2359   GFRNONAA >60 06/08/2015 2359   GFRAA >60 06/08/2015 2359     Recent Labs Lab 06/08/15 1438  LIPASE 26    No results for input(s): CKTOTAL, CKMB, CKMBINDEX, TROPONINI in the last 168 hours. BNP (last 3 results) No results for input(s): BNP in the last 8760 hours.  ProBNP (last 3 results) No results for input(s): PROBNP in the last 8760 hours.   Radiological Exams on Admission: Dg Chest 2 View  06/09/2015   CLINICAL DATA:  Patient with  abdominal and back pain.  Vomiting.  EXAM: CHEST  2 VIEW  COMPARISON:  Chest radiograph 01/03/2014  FINDINGS: Stable cardiac and mediastinal contours. No consolidative pulmonary opacities. No pleural effusion or pneumothorax. Cholecystectomy clips. Mid thoracic spine degenerative changes.  IMPRESSION: No acute cardiopulmonary process.   Electronically Signed   By: Lovey Newcomer M.D.   On: 06/09/2015 00:24   Mr Thoracic Spine W Wo Contrast  06/08/2015   CLINICAL DATA:  Patient experienced sudden onset of RIGHT upper quadrant abdominal pain radiating to the RIGHT shoulder in middle of the back. This occurred earlier today.  EXAM: MRI THORACIC SPINE WITHOUT AND WITH CONTRAST  TECHNIQUE: Multiplanar and multiecho pulse sequences of the thoracic spine were obtained without and with intravenous contrast.  CONTRAST:  33mL MULTIHANCE GADOBENATE DIMEGLUMINE 529 MG/ML IV SOLN  COMPARISON:  CT abdomen and pelvis 06/08/2015.  CT chest 01/03/2014.  FINDINGS: Numbering of the thoracic spine was accomplished by counting down from the odontoid.  There is a greater than 10 degrees scoliosis convex RIGHT centered in the mid thoracic region. There is no worrisome vertebral body abnormality. Paravertebral soft tissues are unremarkable. No thoracic disc protrusion, but there is a tiny protrusion at C7-T1 which appears noncompressive.  In the upper thoracic region, the cord is mildly displaced RIGHT to LEFT. There is a possible intradural extramedullary 5 mm rounded structure at the T4 level which could contribute to mild RIGHT to LEFT cord displacement. While it is possible this represents flow artifact, on most of the images, it appears to be a real finding. There is slight T2 hyperintensity of the cord opposite T3 and T4, without cord enlargement. Significance is uncertain. No peripheral cord hyperintensity to suggest demyelinating lesion. No hydromyelia is evident or apparent tonsillar descent. Post infusion images do not reveal  abnormal enhancement intraspinal contents or cord.  Note is made of marked esophageal enlargement in the mid thoracic region, with an air-fluid level. See for instance image 18 of multiple axial series. Considerations for this appearance include gastroesophageal reflux or distal esophageal stricture. This could contribute to chest pain.  IMPRESSION: Mild scoliosis convex RIGHT centered in mid thoracic region.  Possible intradural extramedullary 5 mm rounded structure at the T4 level which could contribute to slight cord displacement. This likely represents a benign incidental long-standing structure such as a small nonenhancing meningioma or even intradural synovial cyst.  Slight cord T2 hyperintensity opposite T3 and T4 without enlargement or enhancement, of uncertain significance. Neoplasm, myelitis, demyelinating disease, or remote insult are considerations. It is unlikely this represents an acute phenomenon.  Marked esophageal enlargement in the mid thoracic lesion with an air-fluid level. Considerations include GE reflux or distal esophageal stricture.   Electronically Signed   By: Staci Righter M.D.   On: 06/08/2015 21:32   Ct Abdomen Pelvis W Contrast  06/08/2015   CLINICAL DATA:  Abdominal pain. Sudden onset of RIGHT upper quadrant pain radiating to the back and shoulder. Surgical history of gastric bypass.  EXAM: CT ABDOMEN AND PELVIS WITH CONTRAST  TECHNIQUE: Multidetector CT imaging of the abdomen and pelvis was performed using the standard protocol following bolus administration of intravenous contrast.  CONTRAST:  181mL OMNIPAQUE IOHEXOL 300 MG/ML  SOLN  COMPARISON:  MRCP 04/13/2014.  FINDINGS: Musculoskeletal: No aggressive osseous lesions. Benign hemangioma in the L4 vertebra. Scattered Schmorl's nodes. Thoracolumbar vertebral body height is preserved. RIGHT L5-S1 severe facet arthrosis.  Lung Bases: Respiratory motion artifact. Linear scarring or atelectasis in the lingula.  Liver: Mild  intrahepatic biliary ductal dilation appears similar to previous MRCP and CT abdomen. No mass lesion.  Spleen:  Normal.  Gallbladder:  Cholecystectomy.  Common bile duct: Postcholecystectomy dilation of the biliary system. No calcified common duct stone is identified. No changed and common bile duct diameter.  Pancreas:  Normal.  Adrenal glands:  Normal bilaterally.  Kidneys: Normal enhancement and excretion of contrast. Tiny low-density lesion in the interpolar LEFT kidney likely representing a small cyst. LEFT ureter normal. RIGHT ureter normal.  Stomach: Small hiatal hernia. Postsurgical changes of gastric bypass. Normal opacification of the gastrojejunostomy with oral contrast.  Small bowel:  No small bowel obstruction or complicating features.  Colon: High position of the cecum. Colon appears within normal limits.  Pelvic Genitourinary: Distended urinary bladder. Hysterectomy. Laxity of the pelvic floor.  Peritoneum: No free air or free fluid.  Vascular/lymphatic: Mild atherosclerosis. No acute vascular abnormality.  Body Wall: Normal.  IMPRESSION: 1. Uncomplicated gastric bypass. 2. Small hiatal hernia. 3. Cholecystectomy with chronic postcholecystectomy the intra and extrahepatic biliary ductal dilation. No interval change. 4. Hysterectomy.   Electronically Signed   By: Dereck Ligas M.D.   On: 06/08/2015 17:24   EKG: Independently reviewed. normal sinus rhythm, nonspecific ST and T waves changes.  Assessment/Plan Principal Problem:   Intractable nausea and vomiting Active Problems:   HTN (hypertension)   Transaminitis   Intrahepatic bile duct dilation   CAD (coronary artery disease)   Epigastric pain   Spinal cord inflammation   1. Intractable nausea and vomiting  Epigastric pain. Transaminitis. CBD dilatation postoperative.  Possibly viral gastritis, viral hepatitis  patient presents with complaints of severe right-sided upper quadrant and epigastric pain, CT of the abdomen was  performed which did not show any acute abnormality. MRI of the thoracic spine did not also show any acute abnormality of the spine or spinal cord but did show some gastritis. On examination her pain has improved significantly and she feels better at present but continues to have the nausea. With this finding she was hypertensive and was having significant lactic acidosis with leukocytosis. On recheck all of her labs, she is found to have significantly elevated  at liver enzymes, with normal INR, with normal renal function, improved lactic acid levels. Possibility of viral gastritis with associated hepatitis cannot be ruled out. Also possibility of other etiologies of liver inflammation also cannot be ruled out. I would check Tylenol level. We will check right upper quadrant ultrasound as well as will check acute hepatitis panel. Patient remains nothing by mouth. GI consult in the morning. Until that symptomatic management with IV Reglan since Zofran has not been effective in improving patient's impotence. Protonix every 12 hours. IV hydration. Expecting lactic acid remain elevated secondary to liver inflammation. Pro-calcitonin 0.22 suggesting no evidence of systemic sepsis. Although I would obtain sepsis workup.  2.Spinal cord inflammation. Chronic back pain. Patient had an MRI of her thoracic spine with and without contrast which did show a T2 enhancement of T3 and T4 vertebra. She does not have any focal deficit or any radiculopathy symptoms or her legs and does not have it lost any control of bowel or bladder or no saddle anesthesia. She also does not have any similar radiculopathy or focal deficit on her upper extremity. With this the MRI mentions that likely etiology does not appear to be acute in nature and over the phone discussion with neurology on-call also felt that this appears to be an incidental finding as she does not have any radicular symptoms to explain these findings. With this  the patient will be closely monitored with serial neuro checks and physical therapy consultation in the morning. If there is no improvement in patient's symptoms are worse other worsening neurology should be evaluating the patient.  3. Essential hypertension with acceleration. Blood pressure mildly elevated would continue hydralazine IV.  Advance goals of care discussion: Full code   DVT Prophylaxis: subcutaneous Heparin Nutrition: Nothing by mouth except medication  Family Communication: family was present at bedside, opportunity was given to ask question and all questions were answered satisfactorily at the time of interview. Disposition: Initially admitted as observation later on changed as inpatient secondary to worsening lactic acidosis as well as new findings of transaminitis.  Author: Berle Mull, MD [facility] Pager: [phone] 06/08/2015   If 7PM-7AM, please contact night-coverage [email or web address] Password [email or web address]

## 2015-06-09 NOTE — Progress Notes (Signed)
[facility] PROGRESS NOTE   Kimberly Stone AJO:[id number] DOB: 1946/02/23 DOA: 06/08/2015 PCP: Jerlyn Ly, MD  HPI/Subjective: Seen with husband at bedside, said complaining about severe back pain. Also complaining about shoulder pain acute on chronic.  Assessment/Plan: Principal Problem:   Intractable nausea and vomiting Active Problems:   HTN (hypertension)   Transaminitis   Intrahepatic bile duct dilation   CAD (coronary artery disease)   Epigastric pain   Spinal cord inflammation   Hepatitis   Intractable nausea and vomiting  With transaminitis, epigastric pain which is transient and postoperative biliary dilatation. Possibly viral gastritis, viral hepatitis. Normal Tylenol level, possible viral gastritis. Symptomatic management with Reglan and Zofran. Also started on Protonix every 12 hours, if symptoms continue without relief, then we'll consider to consult GI.  Spinal cord inflammation. Chronic back pain. Patient had an MRI of her thoracic spine with and without contrast which did show a T2 enhancement of T3 and T4 vertebra. She does not have any focal deficit or any radiculopathy symptoms or her legs and does not have it lost any control of bowel or bladder or no saddle anesthesia. She also does not have any similar radiculopathy or focal deficit on her upper extremity. With this the MRI mentions that likely etiology does not appear to be acute in nature and over the phone discussion with neurology on-call also felt that this appears to be an incidental finding as she does not have any radicular symptoms to explain these findings. With this the patient will be closely monitored with serial neuro checks and physical therapy consultation in the morning. If there is no improvement in patient's symptoms are worse other worsening neurology should be evaluating the patient.  Essential hypertension with acceleration. Blood pressure mildly elevated likely secondary  to the pain would continue hydralazine IV.  Code Status: Full Code Family Communication: Plan discussed with the patient. Disposition Plan: Remains inpatient Diet: Diet NPO time specified Except for: Sips with Meds, Ice Chips  Consultants:  None  Procedures:  None  Antibiotics:  None   Objective: Filed Vitals:   06/09/15 1058  BP: 164/74  Pulse: 98  Temp: 98.7 F (37.1 C)  Resp: 18    Intake/Output Summary (Last 24 hours) at 06/09/15 1409 Last data filed at 06/09/15 0900  Gross per 24 hour  Intake   1670 ml  Output    250 ml  Net   1420 ml   Filed Weights   06/09/15 0057  Weight: 57.97 kg (127 lb 12.8 oz)    Exam: General: Alert and awake, oriented x3, not in any acute distress. HEENT: anicteric sclera, pupils reactive to light and accommodation, EOMI CVS: S1-S2 clear, no murmur rubs or gallops Chest: clear to auscultation bilaterally, no wheezing, rales or rhonchi Abdomen: soft nontender, nondistended, normal bowel sounds, no organomegaly Extremities: no cyanosis, clubbing or edema noted bilaterally Neuro: Cranial nerves II-XII intact, no focal neurological deficits  Data Reviewed: Basic Metabolic Panel:  Recent Labs Lab 06/08/15 1438 06/08/15 2359 06/09/15 0721  NA 137 133* 136  K 3.5 3.8 4.0  CL 105 96* 101  CO2 20* 24 22  GLUCOSE 141* 184* 136*  BUN 14 10 9   CREATININE 1.06* 0.83 0.76  CALCIUM 9.1 9.3 9.3   Liver Function Tests:  Recent Labs Lab 06/08/15 1438 06/08/15 2359 06/09/15 0721  AST 40 871* 321*  ALT 30 294* 232*  ALKPHOS 155* 382* 347*  BILITOT 1.0 1.6* 1.4*  PROT 7.5 7.7 7.5  ALBUMIN  4.1 4.3 4.0    Recent Labs Lab 06/08/15 1438 06/09/15 0721  LIPASE 26 23   No results for input(s): AMMONIA in the last 168 hours. CBC:  Recent Labs Lab 06/08/15 1438 06/08/15 2359 06/09/15 0721  WBC 12.7* 16.7* 17.2*  NEUTROABS 9.7* 15.4* 15.2*  HGB 12.8 13.1 12.3  HCT 38.3 38.8 37.4  MCV 89.3 88.8 90.1  PLT 305 279 330     Cardiac Enzymes: No results for input(s): CKTOTAL, CKMB, CKMBINDEX, TROPONINI in the last 168 hours. BNP (last 3 results) No results for input(s): BNP in the last 8760 hours.  ProBNP (last 3 results) No results for input(s): PROBNP in the last 8760 hours.  CBG: No results for input(s): GLUCAP in the last 168 hours.  Micro No results found for this or any previous visit (from the past 240 hour(s)).   Studies: Dg Chest 2 View  06/09/2015   CLINICAL DATA:  Patient with abdominal and back pain.  Vomiting.  EXAM: CHEST  2 VIEW  COMPARISON:  Chest radiograph 01/03/2014  FINDINGS: Stable cardiac and mediastinal contours. No consolidative pulmonary opacities. No pleural effusion or pneumothorax. Cholecystectomy clips. Mid thoracic spine degenerative changes.  IMPRESSION: No acute cardiopulmonary process.   Electronically Signed   By: Lovey Newcomer M.D.   On: 06/09/2015 00:24   Mr Thoracic Spine W Wo Contrast  06/08/2015   CLINICAL DATA:  Patient experienced sudden onset of RIGHT upper quadrant abdominal pain radiating to the RIGHT shoulder in middle of the back. This occurred earlier today.  EXAM: MRI THORACIC SPINE WITHOUT AND WITH CONTRAST  TECHNIQUE: Multiplanar and multiecho pulse sequences of the thoracic spine were obtained without and with intravenous contrast.  CONTRAST:  69mL MULTIHANCE GADOBENATE DIMEGLUMINE 529 MG/ML IV SOLN  COMPARISON:  CT abdomen and pelvis 06/08/2015.  CT chest 01/03/2014.  FINDINGS: Numbering of the thoracic spine was accomplished by counting down from the odontoid.  There is a greater than 10 degrees scoliosis convex RIGHT centered in the mid thoracic region. There is no worrisome vertebral body abnormality. Paravertebral soft tissues are unremarkable. No thoracic disc protrusion, but there is a tiny protrusion at C7-T1 which appears noncompressive.  In the upper thoracic region, the cord is mildly displaced RIGHT to LEFT. There is a possible intradural extramedullary 5  mm rounded structure at the T4 level which could contribute to mild RIGHT to LEFT cord displacement. While it is possible this represents flow artifact, on most of the images, it appears to be a real finding. There is slight T2 hyperintensity of the cord opposite T3 and T4, without cord enlargement. Significance is uncertain. No peripheral cord hyperintensity to suggest demyelinating lesion. No hydromyelia is evident or apparent tonsillar descent. Post infusion images do not reveal abnormal enhancement intraspinal contents or cord.  Note is made of marked esophageal enlargement in the mid thoracic region, with an air-fluid level. See for instance image 18 of multiple axial series. Considerations for this appearance include gastroesophageal reflux or distal esophageal stricture. This could contribute to chest pain.  IMPRESSION: Mild scoliosis convex RIGHT centered in mid thoracic region.  Possible intradural extramedullary 5 mm rounded structure at the T4 level which could contribute to slight cord displacement. This likely represents a benign incidental long-standing structure such as a small nonenhancing meningioma or even intradural synovial cyst.  Slight cord T2 hyperintensity opposite T3 and T4 without enlargement or enhancement, of uncertain significance. Neoplasm, myelitis, demyelinating disease, or remote insult are considerations. It is unlikely this represents  an acute phenomenon.  Marked esophageal enlargement in the mid thoracic lesion with an air-fluid level. Considerations include GE reflux or distal esophageal stricture.   Electronically Signed   By: Staci Righter M.D.   On: 06/08/2015 21:32   Ct Abdomen Pelvis W Contrast  06/08/2015   CLINICAL DATA:  Abdominal pain. Sudden onset of RIGHT upper quadrant pain radiating to the back and shoulder. Surgical history of gastric bypass.  EXAM: CT ABDOMEN AND PELVIS WITH CONTRAST  TECHNIQUE: Multidetector CT imaging of the abdomen and pelvis was performed  using the standard protocol following bolus administration of intravenous contrast.  CONTRAST:  148mL OMNIPAQUE IOHEXOL 300 MG/ML  SOLN  COMPARISON:  MRCP 04/13/2014.  FINDINGS: Musculoskeletal: No aggressive osseous lesions. Benign hemangioma in the L4 vertebra. Scattered Schmorl's nodes. Thoracolumbar vertebral body height is preserved. RIGHT L5-S1 severe facet arthrosis.  Lung Bases: Respiratory motion artifact. Linear scarring or atelectasis in the lingula.  Liver: Mild intrahepatic biliary ductal dilation appears similar to previous MRCP and CT abdomen. No mass lesion.  Spleen:  Normal.  Gallbladder:  Cholecystectomy.  Common bile duct: Postcholecystectomy dilation of the biliary system. No calcified common duct stone is identified. No changed and common bile duct diameter.  Pancreas:  Normal.  Adrenal glands:  Normal bilaterally.  Kidneys: Normal enhancement and excretion of contrast. Tiny low-density lesion in the interpolar LEFT kidney likely representing a small cyst. LEFT ureter normal. RIGHT ureter normal.  Stomach: Small hiatal hernia. Postsurgical changes of gastric bypass. Normal opacification of the gastrojejunostomy with oral contrast.  Small bowel:  No small bowel obstruction or complicating features.  Colon: High position of the cecum. Colon appears within normal limits.  Pelvic Genitourinary: Distended urinary bladder. Hysterectomy. Laxity of the pelvic floor.  Peritoneum: No free air or free fluid.  Vascular/lymphatic: Mild atherosclerosis. No acute vascular abnormality.  Body Wall: Normal.  IMPRESSION: 1. Uncomplicated gastric bypass. 2. Small hiatal hernia. 3. Cholecystectomy with chronic postcholecystectomy the intra and extrahepatic biliary ductal dilation. No interval change. 4. Hysterectomy.   Electronically Signed   By: Dereck Ligas M.D.   On: 06/08/2015 17:24   US Abdomen Limited Ruq  06/09/2015   CLINICAL DATA:  Right upper quadrant pain, elevated LFTs, status post cholecystectomy   EXAM: US ABDOMEN LIMITED - RIGHT UPPER QUADRANT  COMPARISON:  CT abdomen pelvis dated 06/08/2015  FINDINGS: Gallbladder:  Surgically absent.  Common bile duct:  Diameter: 7 mm.  Liver:  Mild intrahepatic ductal dilatation. No focal hepatic lesion is seen.  IMPRESSION: Status post cholecystectomy. Mild intrahepatic ductal dilatation, chronic. Common duct measures 7 mm, at the upper limits of normal.   Electronically Signed   By: Julian Hy M.D.   On: 06/09/2015 10:10    Scheduled Meds: . amLODipine  5 mg Oral Daily  . clopidogrel  75 mg Oral Daily  . enoxaparin (LOVENOX) injection  40 mg Subcutaneous Q24H  . gi cocktail  30 mL Oral Once  . pantoprazole (PROTONIX) IV  40 mg Intravenous Q12H  . sodium chloride  1,000 mL Intravenous Once  . sodium chloride  3 mL Intravenous Q12H   Continuous Infusions: . sodium chloride 100 mL/hr at 06/09/15 0030       Time spent: 35 minutes    [facility]  [facility] Pager [phone] If 7PM-7AM, please contact night-coverage at [email or web address], password [facility] 06/09/2015, 2:09 PM  LOS: 1 day

## 2015-06-09 NOTE — Progress Notes (Signed)
CRITICAL VALUE ALERT  Critical value received:  Lactic acid 4.4  Date of notification:  06/09/15 Time of notification:  0335 Critical value read back:yes  Nurse who received alert:  Eldridge Dace  MD notified (1st page):  k schorr text paged results  Time of first page:  0335 MD notified (2nd page):  Time of second page:  Responding MD:    Time MD responded:

## 2015-06-09 NOTE — ED Notes (Signed)
Page sent out to Dr. Posey Pronto to inform him of critical Lactic Acid lab.  Will follow up with Primary RN on 3E as well.

## 2015-06-09 NOTE — Evaluation (Signed)
Physical Therapy Evaluation Patient Details Name: Kimberly Stone MRN: [id number] DOB: [birth year] Today's Date: 06/09/2015   History of Present Illness  Kimberly Stone is a 69 y.o. female with Past medical history of hypertension, coronary artery disease, status post gastric bypass and cholecystectomy, dyslipidemia, dilated CBD.Admit with abdominal and back pain severe.  CT thoracic spine revealed area at T2 with Tomball displacement as well as scoliosis.  MD also questioning esophageal stricture and reflux.   Clinical Impression  Pt admitted with above diagnosis. Pt currently with functional limitations due to the deficits listed below (see PT Problem List). Pt able to ambulate with fair safety awareness but cannot tolerate challenges to balance.  May need RW or cane.  Will follow acutely.   Pt will benefit from skilled PT to increase their independence and safety with mobility to allow discharge to the venue listed below.      Follow Up Recommendations Home health PT;Supervision/Assistance - 24 hour    Equipment Recommendations  Rolling walker with 5" wheels;3in1 (PT)    Recommendations for Other Services       Precautions / Restrictions Precautions Precautions: Fall Restrictions Weight Bearing Restrictions: No      Mobility  Bed Mobility               General bed mobility comments: NT in chair   Transfers Overall transfer level: Independent Equipment used: None                Ambulation/Gait Ambulation/Gait assistance: Min guard Ambulation Distance (Feet): 100 Feet Assistive device: None Gait Pattern/deviations: Step-through pattern;Decreased stride length;Narrow base of support;Drifts right/left   Gait velocity interpretation: Below normal speed for age/gender General Gait Details: Pt able to ambulate with fair balance.  When in pain, needed guard assist for balance.  Pt diaphoretic but not dizzy.  Had to turn around and go back due to being "hot" and in pain.    Stairs            Wheelchair Mobility    Modified Rankin (Stroke Patients Only)       Balance Overall balance assessment: Needs assistance;History of Falls         Standing balance support: No upper extremity supported;During functional activity Standing balance-Leahy Scale: Fair Standing balance comment: can stand statically without physical assist. Washed hands at sink standing without assist.  Cannot withstand challenges to balance.                              Pertinent Vitals/Pain Pain Assessment: 0-10 Pain Score: 9  Pain Location: abdomen, back and neck Pain Descriptors / Indicators: Aching;Grimacing;Guarding;Stabbing Pain Intervention(s): Limited activity within patient's tolerance;Monitored during session;Premedicated before session;Repositioned;Patient requesting pain meds-RN notified;Heat applied  VSS    Home Living Family/patient expects to be discharged to:: Private residence Living Arrangements: Spouse/significant other;Children Available Help at Discharge: Family;Available 24 hours/day Type of Home: House Home Access: Stairs to enter   [employer] of Steps: 2 Home Layout: Two level;Bed/bath upstairs Home Equipment: None      Prior Function Level of Independence: Independent         Comments: Pt reports she was driving tractor and mowing lawn PTA.      Hand Dominance        Extremity/Trunk Assessment   Upper Extremity Assessment: Defer to OT evaluation           Lower Extremity Assessment: Generalized weakness  Communication   Communication: No difficulties  Cognition Arousal/Alertness: Awake/alert Behavior During Therapy: Anxious Overall Cognitive Status: Within Functional Limits for tasks assessed                      General Comments      Exercises        Assessment/Plan    PT Assessment Patient needs continued PT services  PT Diagnosis Generalized weakness;Acute pain    PT Problem List Decreased balance;Decreased activity tolerance;Decreased mobility;Decreased knowledge of use of DME;Decreased safety awareness;Decreased knowledge of precautions;Pain  PT Treatment Interventions DME instruction;Gait training;Functional mobility training;Therapeutic activities;Therapeutic exercise;Balance training;Stair training;Patient/family education   PT Goals (Current goals can be found in the Care Plan section) Acute Rehab PT Goals Patient Stated Goal: to get better PT Goal Formulation: With patient Time For Goal Achievement: 06/16/15 Potential to Achieve Goals: Good    Frequency Min 3X/week   Barriers to discharge        Co-evaluation               End of Session Equipment Utilized During Treatment: Gait belt Activity Tolerance: Patient limited by fatigue;Patient limited by pain Patient left: in chair;with call bell/phone within reach;with family/visitor present Nurse Communication: Mobility status;Patient requests pain meds         Time: 1700-1749 PT Time Calculation (min) (ACUTE ONLY): 17 min   Charges:   PT Evaluation $Initial PT Evaluation Tier I: 1 Procedure     PT G Codes:        Kimberly Stone, Kimberly Stone Jul 01, 2015, 1:12 PM Kimberly Stone,PT Acute Rehabilitation [phone] (pager)

## 2015-06-10 DIAGNOSIS — K838 Other specified diseases of biliary tract: Secondary | ICD-10-CM

## 2015-06-10 LAB — HEPATITIS PANEL, ACUTE
HCV Ab: <0.1 s/co ratio (ref 0.0–0.9)
Hep A IgM: NEGATIVE
Hep B C IgM: NEGATIVE
Hepatitis B Surface Ag: NEGATIVE

## 2015-06-10 LAB — COMPREHENSIVE METABOLIC PANEL
ALT: 152 U/L — ABNORMAL HIGH (ref 14–54)
AST: 131 U/L — ABNORMAL HIGH (ref 15–41)
Albumin: 3.9 g/dL (ref 3.5–5.0)
Alkaline Phosphatase: 295 U/L — ABNORMAL HIGH (ref 38–126)
Anion gap: 10 (ref 5–15)
BUN: 7 mg/dL (ref 6–20)
CO2: 26 mmol/L (ref 22–32)
Calcium: 9.5 mg/dL (ref 8.9–10.3)
Chloride: 96 mmol/L — ABNORMAL LOW (ref 101–111)
Creatinine, Ser: 0.59 mg/dL (ref 0.44–1.00)
GFR calc Af Amer: >60 mL/min (ref >60)
GFR calc non Af Amer: >60 mL/min (ref >60)
Glucose, Bld: 118 mg/dL — ABNORMAL HIGH (ref 65–99)
Potassium: 3.3 mmol/L — ABNORMAL LOW (ref 3.5–5.1)
Sodium: 132 mmol/L — ABNORMAL LOW (ref 135–145)
Total Bilirubin: 1.5 mg/dL — ABNORMAL HIGH (ref 0.3–1.2)
Total Protein: 7.2 g/dL (ref 6.5–8.1)

## 2015-06-10 MED ORDER — GI COCKTAIL ~~LOC~~
30.0000 mL | Freq: Once | ORAL | Status: AC
  Administered 2015-06-10: 30 mL via ORAL
  Filled 2015-06-10: qty 30

## 2015-06-10 MED ORDER — POTASSIUM CHLORIDE CRYS ER 20 MEQ PO TBCR
40.0000 meq | EXTENDED_RELEASE_TABLET | Freq: Four times a day (QID) | ORAL | Status: AC
  Administered 2015-06-10 (×2): 40 meq via ORAL
  Filled 2015-06-10 (×2): qty 2

## 2015-06-10 NOTE — Care Management Note (Signed)
Case Management Note  Patient Details  Name: Kimberly Stone MRN: [id number] Date of Birth: 19-Aug-1946  Subjective/Objective:     Admitted with intractable nausea               Action/Plan: CM following for DCP, possibly need HHC at discharge.  Expected Discharge Date:  06/13/2015 possibly            Expected Discharge Plan:  [place] possibly   Discharge planning Services  CM Consult   Status of Service:  In process, will continue to follow  Royston Bake, RN,BSN,[phone] 06/10/2015, 11:19 AM

## 2015-06-10 NOTE — Progress Notes (Signed)
[facility] PROGRESS NOTE   Kimberly Stone QMG:[id number] DOB: 06-09-1946 DOA: 06/08/2015 PCP: Jerlyn Ly, MD  HPI/Subjective: Patient feels much better, back pain is improving. Denies any nausea vomiting.  Assessment/Plan: Principal Problem:   Intractable nausea and vomiting Active Problems:   HTN (hypertension)   Transaminitis   Intrahepatic bile duct dilation   CAD (coronary artery disease)   Epigastric pain   Spinal cord inflammation   Hepatitis   Intractable nausea and vomiting  With transaminitis, epigastric pain which is transient and postoperative biliary dilatation. Possibly viral gastritis, viral hepatitis. Normal Tylenol level, possible viral gastritis. Symptomatic management with Reglan and Zofran. Continue Protonix, symptoms resolved, hold off GI consultation.  Spinal cord inflammation. Chronic back pain. Patient had an MRI of her thoracic spine with and without contrast which did show a T2 enhancement of T3 and T4 vertebra. She does not have any focal deficit or any radiculopathy symptoms or her legs and does not have it lost any control of bowel or bladder or no saddle anesthesia. She also does not have any similar radiculopathy or focal deficit on her upper extremity. With this the MRI mentions that likely etiology does not appear to be acute in nature and over the phone discussion with neurology on-call also felt that this appears to be an incidental finding as she does not have any radicular symptoms to explain these findings. With this the patient will be closely monitored with serial neuro checks and physical therapy consultation in the morning. Continue pain control and muscle relaxants, continue physical therapy.  Essential hypertension with acceleration. Blood pressure mildly elevated likely secondary to the pain would continue hydralazine IV.  Code Status: Full Code Family Communication: Plan discussed with the patient. Disposition Plan:  Remains inpatient Diet: Diet clear liquid Room service appropriate?: Yes; Fluid consistency:: Thin  Consultants:  None  Procedures:  None  Antibiotics:  None   Objective: Filed Vitals:   06/10/15 1027  BP: 132/61  Pulse: 85  Temp: 98.4 F (36.9 C)  Resp: 18    Intake/Output Summary (Last 24 hours) at 06/10/15 1428 Last data filed at 06/10/15 1100  Gross per 24 hour  Intake   1440 ml  Output   1250 ml  Net    190 ml   Filed Weights   06/09/15 0057 06/10/15 0656  Weight: 57.97 kg (127 lb 12.8 oz) 61.462 kg (135 lb 8 oz)    Exam: General: Alert and awake, oriented x3, not in any acute distress. HEENT: anicteric sclera, pupils reactive to light and accommodation, EOMI CVS: S1-S2 clear, no murmur rubs or gallops Chest: clear to auscultation bilaterally, no wheezing, rales or rhonchi Abdomen: soft nontender, nondistended, normal bowel sounds, no organomegaly Extremities: no cyanosis, clubbing or edema noted bilaterally Neuro: Cranial nerves II-XII intact, no focal neurological deficits  Data Reviewed: Basic Metabolic Panel:  Recent Labs Lab 06/08/15 1438 06/08/15 2359 06/09/15 0721 06/10/15 0342  NA 137 133* 136 132*  K 3.5 3.8 4.0 3.3*  CL 105 96* 101 96*  CO2 20* 24 22 26   GLUCOSE 141* 184* 136* 118*  BUN 14 10 9 7   CREATININE 1.06* 0.83 0.76 0.59  CALCIUM 9.1 9.3 9.3 9.5   Liver Function Tests:  Recent Labs Lab 06/08/15 1438 06/08/15 2359 06/09/15 0721 06/10/15 0342  AST 40 871* 321* 131*  ALT 30 294* 232* 152*  ALKPHOS 155* 382* 347* 295*  BILITOT 1.0 1.6* 1.4* 1.5*  PROT 7.5 7.7 7.5 7.2  ALBUMIN 4.1 4.3  4.0 3.9    Recent Labs Lab 06/08/15 1438 06/09/15 0721  LIPASE 26 23   No results for input(s): AMMONIA in the last 168 hours. CBC:  Recent Labs Lab 06/08/15 1438 06/08/15 2359 06/09/15 0721  WBC 12.7* 16.7* 17.2*  NEUTROABS 9.7* 15.4* 15.2*  HGB 12.8 13.1 12.3  HCT 38.3 38.8 37.4  MCV 89.3 88.8 90.1  PLT 305 279 330    Cardiac Enzymes: No results for input(s): CKTOTAL, CKMB, CKMBINDEX, TROPONINI in the last 168 hours. BNP (last 3 results) No results for input(s): BNP in the last 8760 hours.  ProBNP (last 3 results) No results for input(s): PROBNP in the last 8760 hours.  CBG: No results for input(s): GLUCAP in the last 168 hours.  Micro Recent Results (from the past 240 hour(s))  Culture, blood (x 2)     Status: None (Preliminary result)   Collection Time: 06/08/15 11:45 PM  Result Value Ref Range Status   Specimen Description BLOOD RIGHT ARM  Final   Special Requests BOTTLES DRAWN AEROBIC ONLY 5CC  Final   Culture NO GROWTH 1 DAY  Final   Report Status PENDING  Incomplete  Culture, blood (routine x 2)     Status: None (Preliminary result)   Collection Time: 06/08/15 11:55 PM  Result Value Ref Range Status   Specimen Description BLOOD LEFT ARM  Final   Special Requests BOTTLES DRAWN AEROBIC AND ANAEROBIC 5CC  Final   Culture NO GROWTH 1 DAY  Final   Report Status PENDING  Incomplete  Culture, blood (x 2)     Status: None (Preliminary result)   Collection Time: 06/09/15  2:41 AM  Result Value Ref Range Status   Specimen Description BLOOD LEFT HAND  Final   Special Requests BOTTLES DRAWN AEROBIC ONLY 5CC  Final   Culture NO GROWTH 1 DAY  Final   Report Status PENDING  Incomplete     Studies: Dg Chest 2 View  06/09/2015   CLINICAL DATA:  Patient with abdominal and back pain.  Vomiting.  EXAM: CHEST  2 VIEW  COMPARISON:  Chest radiograph 01/03/2014  FINDINGS: Stable cardiac and mediastinal contours. No consolidative pulmonary opacities. No pleural effusion or pneumothorax. Cholecystectomy clips. Mid thoracic spine degenerative changes.  IMPRESSION: No acute cardiopulmonary process.   Electronically Signed   By: Lovey Newcomer M.D.   On: 06/09/2015 00:24   Mr Thoracic Spine W Wo Contrast  06/08/2015   CLINICAL DATA:  Patient experienced sudden onset of RIGHT upper quadrant abdominal pain  radiating to the RIGHT shoulder in middle of the back. This occurred earlier today.  EXAM: MRI THORACIC SPINE WITHOUT AND WITH CONTRAST  TECHNIQUE: Multiplanar and multiecho pulse sequences of the thoracic spine were obtained without and with intravenous contrast.  CONTRAST:  77mL MULTIHANCE GADOBENATE DIMEGLUMINE 529 MG/ML IV SOLN  COMPARISON:  CT abdomen and pelvis 06/08/2015.  CT chest 01/03/2014.  FINDINGS: Numbering of the thoracic spine was accomplished by counting down from the odontoid.  There is a greater than 10 degrees scoliosis convex RIGHT centered in the mid thoracic region. There is no worrisome vertebral body abnormality. Paravertebral soft tissues are unremarkable. No thoracic disc protrusion, but there is a tiny protrusion at C7-T1 which appears noncompressive.  In the upper thoracic region, the cord is mildly displaced RIGHT to LEFT. There is a possible intradural extramedullary 5 mm rounded structure at the T4 level which could contribute to mild RIGHT to LEFT cord displacement. While it is possible this represents  flow artifact, on most of the images, it appears to be a real finding. There is slight T2 hyperintensity of the cord opposite T3 and T4, without cord enlargement. Significance is uncertain. No peripheral cord hyperintensity to suggest demyelinating lesion. No hydromyelia is evident or apparent tonsillar descent. Post infusion images do not reveal abnormal enhancement intraspinal contents or cord.  Note is made of marked esophageal enlargement in the mid thoracic region, with an air-fluid level. See for instance image 18 of multiple axial series. Considerations for this appearance include gastroesophageal reflux or distal esophageal stricture. This could contribute to chest pain.  IMPRESSION: Mild scoliosis convex RIGHT centered in mid thoracic region.  Possible intradural extramedullary 5 mm rounded structure at the T4 level which could contribute to slight cord displacement. This  likely represents a benign incidental long-standing structure such as a small nonenhancing meningioma or even intradural synovial cyst.  Slight cord T2 hyperintensity opposite T3 and T4 without enlargement or enhancement, of uncertain significance. Neoplasm, myelitis, demyelinating disease, or remote insult are considerations. It is unlikely this represents an acute phenomenon.  Marked esophageal enlargement in the mid thoracic lesion with an air-fluid level. Considerations include GE reflux or distal esophageal stricture.   Electronically Signed   By: Staci Righter M.D.   On: 06/08/2015 21:32   Ct Abdomen Pelvis W Contrast  06/08/2015   CLINICAL DATA:  Abdominal pain. Sudden onset of RIGHT upper quadrant pain radiating to the back and shoulder. Surgical history of gastric bypass.  EXAM: CT ABDOMEN AND PELVIS WITH CONTRAST  TECHNIQUE: Multidetector CT imaging of the abdomen and pelvis was performed using the standard protocol following bolus administration of intravenous contrast.  CONTRAST:  168mL OMNIPAQUE IOHEXOL 300 MG/ML  SOLN  COMPARISON:  MRCP 04/13/2014.  FINDINGS: Musculoskeletal: No aggressive osseous lesions. Benign hemangioma in the L4 vertebra. Scattered Schmorl's nodes. Thoracolumbar vertebral body height is preserved. RIGHT L5-S1 severe facet arthrosis.  Lung Bases: Respiratory motion artifact. Linear scarring or atelectasis in the lingula.  Liver: Mild intrahepatic biliary ductal dilation appears similar to previous MRCP and CT abdomen. No mass lesion.  Spleen:  Normal.  Gallbladder:  Cholecystectomy.  Common bile duct: Postcholecystectomy dilation of the biliary system. No calcified common duct stone is identified. No changed and common bile duct diameter.  Pancreas:  Normal.  Adrenal glands:  Normal bilaterally.  Kidneys: Normal enhancement and excretion of contrast. Tiny low-density lesion in the interpolar LEFT kidney likely representing a small cyst. LEFT ureter normal. RIGHT ureter normal.   Stomach: Small hiatal hernia. Postsurgical changes of gastric bypass. Normal opacification of the gastrojejunostomy with oral contrast.  Small bowel:  No small bowel obstruction or complicating features.  Colon: High position of the cecum. Colon appears within normal limits.  Pelvic Genitourinary: Distended urinary bladder. Hysterectomy. Laxity of the pelvic floor.  Peritoneum: No free air or free fluid.  Vascular/lymphatic: Mild atherosclerosis. No acute vascular abnormality.  Body Wall: Normal.  IMPRESSION: 1. Uncomplicated gastric bypass. 2. Small hiatal hernia. 3. Cholecystectomy with chronic postcholecystectomy the intra and extrahepatic biliary ductal dilation. No interval change. 4. Hysterectomy.   Electronically Signed   By: Dereck Ligas M.D.   On: 06/08/2015 17:24   US Abdomen Limited Ruq  06/09/2015   CLINICAL DATA:  Right upper quadrant pain, elevated LFTs, status post cholecystectomy  EXAM: US ABDOMEN LIMITED - RIGHT UPPER QUADRANT  COMPARISON:  CT abdomen pelvis dated 06/08/2015  FINDINGS: Gallbladder:  Surgically absent.  Common bile duct:  Diameter: 7 mm.  Liver:  Mild intrahepatic ductal dilatation. No focal hepatic lesion is seen.  IMPRESSION: Status post cholecystectomy. Mild intrahepatic ductal dilatation, chronic. Common duct measures 7 mm, at the upper limits of normal.   Electronically Signed   By: Julian Hy M.D.   On: 06/09/2015 10:10    Scheduled Meds: . amLODipine  5 mg Oral Daily  . clopidogrel  75 mg Oral Daily  . enoxaparin (LOVENOX) injection  40 mg Subcutaneous Q24H  . gi cocktail  30 mL Oral Once  . pantoprazole (PROTONIX) IV  40 mg Intravenous Q12H  . sodium chloride  3 mL Intravenous Q12H   Continuous Infusions: . sodium chloride 100 mL/hr at 06/10/15 0049       Time spent: 35 minutes    [facility] A  [facility] Pager [phone] If 7PM-7AM, please contact night-coverage at [email or web address], password [email or web address] 06/10/2015, 2:28 PM  LOS: 2 days

## 2015-06-11 ENCOUNTER — Inpatient Hospital Stay (HOSPITAL_COMMUNITY): Payer: Medicare Other

## 2015-06-11 LAB — COMPREHENSIVE METABOLIC PANEL
ALT: 106 U/L — ABNORMAL HIGH (ref 14–54)
AST: 83 U/L — ABNORMAL HIGH (ref 15–41)
Albumin: 3.5 g/dL (ref 3.5–5.0)
Alkaline Phosphatase: 257 U/L — ABNORMAL HIGH (ref 38–126)
Anion gap: 10 (ref 5–15)
BUN: 10 mg/dL (ref 6–20)
CO2: 26 mmol/L (ref 22–32)
Calcium: 9.2 mg/dL (ref 8.9–10.3)
Chloride: 96 mmol/L — ABNORMAL LOW (ref 101–111)
Creatinine, Ser: 0.68 mg/dL (ref 0.44–1.00)
GFR calc Af Amer: >60 mL/min (ref >60)
GFR calc non Af Amer: >60 mL/min (ref >60)
Glucose, Bld: 113 mg/dL — ABNORMAL HIGH (ref 65–99)
Potassium: 3.8 mmol/L (ref 3.5–5.1)
Sodium: 132 mmol/L — ABNORMAL LOW (ref 135–145)
Total Bilirubin: 1.5 mg/dL — ABNORMAL HIGH (ref 0.3–1.2)
Total Protein: 7.2 g/dL (ref 6.5–8.1)

## 2015-06-11 LAB — CBC WITH DIFFERENTIAL/PLATELET
Basophils Absolute: 0 10*3/uL (ref 0.0–0.1)
Basophils Relative: 0 % (ref 0–1)
Eosinophils Absolute: 0 10*3/uL (ref 0.0–0.7)
Eosinophils Relative: 0 % (ref 0–5)
HCT: 35 % — ABNORMAL LOW (ref 36.0–46.0)
Hemoglobin: 11.8 g/dL — ABNORMAL LOW (ref 12.0–15.0)
Lymphocytes Relative: 12 % (ref 12–46)
Lymphs Abs: 1.4 10*3/uL (ref 0.7–4.0)
MCH: 29.6 pg (ref 26.0–34.0)
MCHC: 33.7 g/dL (ref 30.0–36.0)
MCV: 87.7 fL (ref 78.0–100.0)
Monocytes Absolute: 0.8 10*3/uL (ref 0.1–1.0)
Monocytes Relative: 7 % (ref 3–12)
Neutro Abs: 9.3 10*3/uL — ABNORMAL HIGH (ref 1.7–7.7)
Neutrophils Relative %: 81 % — ABNORMAL HIGH (ref 43–77)
Platelets: 271 10*3/uL (ref 150–400)
RBC: 3.99 MIL/uL (ref 3.87–5.11)
RDW: 13.3 % (ref 11.5–15.5)
WBC: 11.6 10*3/uL — ABNORMAL HIGH (ref 4.0–10.5)

## 2015-06-11 LAB — URINALYSIS, ROUTINE W REFLEX MICROSCOPIC
Bilirubin Urine: NEGATIVE
Glucose, UA: NEGATIVE mg/dL
Ketones, ur: 15 mg/dL — AB
Nitrite: NEGATIVE
Protein, ur: NEGATIVE mg/dL
Specific Gravity, Urine: 1.013 (ref 1.005–1.030)
Urobilinogen, UA: 1 mg/dL (ref 0.0–1.0)
pH: 6.5 (ref 5.0–8.0)

## 2015-06-11 LAB — URINE MICROSCOPIC-ADD ON

## 2015-06-11 NOTE — Progress Notes (Signed)
PT Cancellation Note  Patient Details Name: Kimberly Stone MRN: [id number] DOB: [birth year]   Cancelled Treatment:    Reason Eval/Treat Not Completed: Other (comment) (Pt refused due to pain.  Has been up in room without  LOB  Per family and pt.)Thanks.   Irwin Brakeman F 06/11/2015, 12:19 PM [facility] [phone] (pager)

## 2015-06-11 NOTE — Care Management Note (Signed)
Case Management Note  Patient Details  Name: Kimberly Stone MRN: [id number] Date of Birth: [birth year]  Subjective/Objective:    Admitted with intractable nausea, back pain                Action/Plan: CM talked to patient about [facility] choices with son and spouse present. Patient chose [facility]. Sheppard Evens with [facility] called for arrangements. No DME needed at this time.  Expected Discharge Date:  06/11/2015              Expected Discharge Plan:  [place]  Discharge planning Services  CM Consult Choice offered to:  Patient  HH Arranged:  PT [facility] Agency:  [facility]  Status of Service:  In process, will continue to follow  Medicare Important Message Given:  Yes Date Medicare IM Given:  06/11/15 Medicare IM give by:  Mindi Slicker RN  Royston Bake, RN,BSN,MHA [phone] 06/11/2015, 10:19 AM

## 2015-06-11 NOTE — Progress Notes (Signed)
[facility] PROGRESS NOTE   Kimberly Stone:[id number] DOB: 08/04/1946 DOA: 06/08/2015 PCP: Jerlyn Ly, MD  HPI/Subjective: Seen with son (who is an Therapist, sports) at bedside, still complaining about pain. Had low grade fever last night. Complains about neck pain, will check MRI of the   Assessment/Plan: Principal Problem:   Intractable nausea and vomiting Active Problems:   HTN (hypertension)   Transaminitis   Intrahepatic bile duct dilation   CAD (coronary artery disease)   Epigastric pain   Spinal cord inflammation   Hepatitis   Intractable nausea and vomiting  With transaminitis, epigastric pain which is transient, U/S showed postoperative biliary dilatation. Normal Tylenol level, possible viral gastritis. Symptomatic management with Reglan and Zofran. Continue Protonix, symptoms resolved, hold off GI consultation. Has chronic AST/ALT elevation.  Spinal cord inflammation. Chronic back pain. Patient had an MRI of her thoracic spine with and without contrast which did show a T2 enhancement of T3 and T4 vertebra. She does not have any focal deficit or any radiculopathy symptoms or her legs and does not have it lost any control of bowel or bladder or no saddle anesthesia. She also does not have any similar radiculopathy or focal deficit on her upper extremity. With this the MRI mentions that likely etiology does not appear to be acute in nature and over the phone discussion with neurology on-call also felt that this appears to be an incidental finding as she does not have any radicular symptoms to explain these findings. With this the patient will be closely monitored with serial neuro checks and physical therapy consultation in the morning. Continue pain control and muscle relaxants, continue physical therapy.  Discussed with Dr. Nelva Bush PA, patient had recent MRI which showed chronic degenerative changes in the neck. She had steroids injection and C7-T1, recommend to  repeat the MR of C-spine. If the MR C-spine showed no evidence of infection she can likely benefit from short-term steroids.  Essential hypertension with acceleration. Blood pressure mildly elevated likely secondary to the pain would continue hydralazine IV.  Low-grade fever Denies any cough, burning while passing urine, she has some neck stiffness but that is because of pain. Patient was able to move her neck when she tolerates the pain. Discussed with her, says she does not want any more needle sticking, she meant she does not want LP.  Code Status: Full Code Family Communication: Plan discussed with the patient. Disposition Plan: Remains inpatient Diet: Diet regular Room service appropriate?: Yes; Fluid consistency:: Thin  Consultants:  None  Procedures:  None  Antibiotics:  None   Objective: Filed Vitals:   06/11/15 1408  BP: 171/82  Pulse: 90  Temp: 98.3 F (36.8 C)  Resp: 20    Intake/Output Summary (Last 24 hours) at 06/11/15 1530 Last data filed at 06/11/15 1317  Gross per 24 hour  Intake   3540 ml  Output   2375 ml  Net   1165 ml   Filed Weights   06/09/15 0057 06/10/15 0656 06/11/15 0505  Weight: 57.97 kg (127 lb 12.8 oz) 61.462 kg (135 lb 8 oz) 60.827 kg (134 lb 1.6 oz)    Exam: General: Alert and awake, oriented x3, not in any acute distress. HEENT: anicteric sclera, pupils reactive to light and accommodation, EOMI CVS: S1-S2 clear, no murmur rubs or gallops Chest: clear to auscultation bilaterally, no wheezing, rales or rhonchi Abdomen: soft nontender, nondistended, normal bowel sounds, no organomegaly Extremities: no cyanosis, clubbing or edema noted bilaterally Neuro: Cranial nerves II-XII  intact, no focal neurological deficits  Data Reviewed: Basic Metabolic Panel:  Recent Labs Lab 06/08/15 1438 06/08/15 2359 06/09/15 0721 06/10/15 0342 06/11/15 0400  NA 137 133* 136 132* 132*  K 3.5 3.8 4.0 3.3* 3.8  CL 105 96* 101 96* 96*  CO2  20* 24 22 26 26   GLUCOSE 141* 184* 136* 118* 113*  BUN 14 10 9 7 10   CREATININE 1.06* 0.83 0.76 0.59 0.68  CALCIUM 9.1 9.3 9.3 9.5 9.2   Liver Function Tests:  Recent Labs Lab 06/08/15 1438 06/08/15 2359 06/09/15 0721 06/10/15 0342 06/11/15 0400  AST 40 871* 321* 131* 83*  ALT 30 294* 232* 152* 106*  ALKPHOS 155* 382* 347* 295* 257*  BILITOT 1.0 1.6* 1.4* 1.5* 1.5*  PROT 7.5 7.7 7.5 7.2 7.2  ALBUMIN 4.1 4.3 4.0 3.9 3.5    Recent Labs Lab 06/08/15 1438 06/09/15 0721  LIPASE 26 23   No results for input(s): AMMONIA in the last 168 hours. CBC:  Recent Labs Lab 06/08/15 1438 06/08/15 2359 06/09/15 0721 06/11/15 0451  WBC 12.7* 16.7* 17.2* 11.6*  NEUTROABS 9.7* 15.4* 15.2* 9.3*  HGB 12.8 13.1 12.3 11.8*  HCT 38.3 38.8 37.4 35.0*  MCV 89.3 88.8 90.1 87.7  PLT 305 279 330 271   Cardiac Enzymes: No results for input(s): CKTOTAL, CKMB, CKMBINDEX, TROPONINI in the last 168 hours. BNP (last 3 results) No results for input(s): BNP in the last 8760 hours.  ProBNP (last 3 results) No results for input(s): PROBNP in the last 8760 hours.  CBG: No results for input(s): GLUCAP in the last 168 hours.  Micro Recent Results (from the past 240 hour(s))  Culture, blood (x 2)     Status: None (Preliminary result)   Collection Time: 06/08/15 11:45 PM  Result Value Ref Range Status   Specimen Description BLOOD RIGHT ARM  Final   Special Requests BOTTLES DRAWN AEROBIC ONLY 5CC  Final   Culture NO GROWTH 1 DAY  Final   Report Status PENDING  Incomplete  Culture, blood (routine x 2)     Status: None (Preliminary result)   Collection Time: 06/08/15 11:55 PM  Result Value Ref Range Status   Specimen Description BLOOD LEFT ARM  Final   Special Requests BOTTLES DRAWN AEROBIC AND ANAEROBIC 5CC  Final   Culture NO GROWTH 1 DAY  Final   Report Status PENDING  Incomplete  Culture, blood (x 2)     Status: None (Preliminary result)   Collection Time: 06/09/15  2:41 AM  Result Value  Ref Range Status   Specimen Description BLOOD LEFT HAND  Final   Special Requests BOTTLES DRAWN AEROBIC ONLY 5CC  Final   Culture NO GROWTH 1 DAY  Final   Report Status PENDING  Incomplete     Studies: No results found.  Scheduled Meds: . amLODipine  5 mg Oral Daily  . clopidogrel  75 mg Oral Daily  . enoxaparin (LOVENOX) injection  40 mg Subcutaneous Q24H  . gi cocktail  30 mL Oral Once  . pantoprazole (PROTONIX) IV  40 mg Intravenous Q12H  . sodium chloride  3 mL Intravenous Q12H   Continuous Infusions:       Time spent: 35 minutes    [facility] A  [facility] Pager [phone] If 7PM-7AM, please contact night-coverage at [email or web address], password [facility] 06/11/2015, 3:30 PM  LOS: 3 days

## 2015-06-12 LAB — CBC
HCT: 36.7 % (ref 36.0–46.0)
Hemoglobin: 12.5 g/dL (ref 12.0–15.0)
MCH: 29.9 pg (ref 26.0–34.0)
MCHC: 34.1 g/dL (ref 30.0–36.0)
MCV: 87.8 fL (ref 78.0–100.0)
Platelets: 279 10*3/uL (ref 150–400)
RBC: 4.18 MIL/uL (ref 3.87–5.11)
RDW: 13.2 % (ref 11.5–15.5)
WBC: 10.9 10*3/uL — ABNORMAL HIGH (ref 4.0–10.5)

## 2015-06-12 LAB — COMPREHENSIVE METABOLIC PANEL
ALT: 71 U/L — ABNORMAL HIGH (ref 14–54)
AST: 40 U/L (ref 15–41)
Albumin: 3.3 g/dL — ABNORMAL LOW (ref 3.5–5.0)
Alkaline Phosphatase: 217 U/L — ABNORMAL HIGH (ref 38–126)
Anion gap: 13 (ref 5–15)
BUN: 14 mg/dL (ref 6–20)
CO2: 26 mmol/L (ref 22–32)
Calcium: 9.2 mg/dL (ref 8.9–10.3)
Chloride: 90 mmol/L — ABNORMAL LOW (ref 101–111)
Creatinine, Ser: 0.61 mg/dL (ref 0.44–1.00)
GFR calc Af Amer: >60 mL/min (ref >60)
GFR calc non Af Amer: >60 mL/min (ref >60)
Glucose, Bld: 114 mg/dL — ABNORMAL HIGH (ref 65–99)
Potassium: 3.2 mmol/L — ABNORMAL LOW (ref 3.5–5.1)
Sodium: 129 mmol/L — ABNORMAL LOW (ref 135–145)
Total Bilirubin: 1.3 mg/dL — ABNORMAL HIGH (ref 0.3–1.2)
Total Protein: 6.7 g/dL (ref 6.5–8.1)

## 2015-06-12 MED ORDER — POTASSIUM CHLORIDE 10 MEQ/100ML IV SOLN
10.0000 meq | INTRAVENOUS | Status: DC
  Filled 2015-06-12 (×2): qty 100

## 2015-06-12 MED ORDER — METHYLPREDNISOLONE SODIUM SUCC 125 MG IJ SOLR
60.0000 mg | Freq: Three times a day (TID) | INTRAMUSCULAR | Status: DC
  Administered 2015-06-12 – 2015-06-13 (×3): 60 mg via INTRAVENOUS
  Filled 2015-06-12 (×2): qty 0.96
  Filled 2015-06-12 (×3): qty 2
  Filled 2015-06-12: qty 0.96

## 2015-06-12 MED ORDER — POTASSIUM CHLORIDE 10 MEQ/100ML IV SOLN
10.0000 meq | INTRAVENOUS | Status: DC
  Administered 2015-06-12: 10 meq via INTRAVENOUS
  Filled 2015-06-12 (×6): qty 100

## 2015-06-12 MED ORDER — POTASSIUM CHLORIDE CRYS ER 20 MEQ PO TBCR
40.0000 meq | EXTENDED_RELEASE_TABLET | Freq: Four times a day (QID) | ORAL | Status: DC
  Filled 2015-06-12: qty 2

## 2015-06-12 MED ORDER — POTASSIUM CHLORIDE CRYS ER 10 MEQ PO TBCR
40.0000 meq | EXTENDED_RELEASE_TABLET | Freq: Four times a day (QID) | ORAL | Status: AC
Start: 2015-06-12 — End: 2015-06-12
  Administered 2015-06-12 (×2): 40 meq via ORAL
  Filled 2015-06-12 (×2): qty 4

## 2015-06-12 MED ORDER — TRAZODONE 25 MG HALF TABLET
25.0000 mg | ORAL_TABLET | Freq: Every day | ORAL | Status: DC
  Administered 2015-06-12: 25 mg via ORAL
  Filled 2015-06-12 (×2): qty 1

## 2015-06-12 MED ORDER — SODIUM CHLORIDE 0.9 % IV SOLN
INTRAVENOUS | Status: DC
  Administered 2015-06-12 (×2): via INTRAVENOUS

## 2015-06-12 MED ORDER — CEFTRIAXONE SODIUM IN DEXTROSE 20 MG/ML IV SOLN
1.0000 g | INTRAVENOUS | Status: DC
  Administered 2015-06-12 – 2015-06-13 (×2): 1 g via INTRAVENOUS
  Filled 2015-06-12 (×2): qty 50

## 2015-06-12 MED ORDER — HYDROMORPHONE HCL 1 MG/ML IJ SOLN
0.5000 mg | INTRAMUSCULAR | Status: DC | PRN
  Administered 2015-06-13 (×2): 0.5 mg via INTRAVENOUS
  Filled 2015-06-12 (×2): qty 1

## 2015-06-12 MED ORDER — DOCUSATE SODIUM 100 MG PO CAPS
100.0000 mg | ORAL_CAPSULE | Freq: Two times a day (BID) | ORAL | Status: DC
  Administered 2015-06-12 – 2015-06-13 (×2): 100 mg via ORAL
  Filled 2015-06-12 (×4): qty 1

## 2015-06-12 MED ORDER — MAGNESIUM SULFATE 2 GM/50ML IV SOLN
2.0000 g | Freq: Once | INTRAVENOUS | Status: AC
  Administered 2015-06-12: 2 g via INTRAVENOUS
  Filled 2015-06-12: qty 50

## 2015-06-12 NOTE — Progress Notes (Signed)
[facility] PROGRESS NOTE   MARAL LAMPE WUJ:[id number] DOB: Jul 23, 1946 DOA: 06/08/2015 PCP: Jerlyn Ly, MD  HPI/Subjective: Seen with husband at bedside, sick complaining about neck and back pain. MRI of the C spine came back negative for hematoma or infection, per Ortho recommendation steroids started. Repeat urinalysis showed low-grade fever and possible UTI, started on antibiotics.  Assessment/Plan: Principal Problem:   Intractable nausea and vomiting Active Problems:   HTN (hypertension)   Transaminitis   Intrahepatic bile duct dilation   CAD (coronary artery disease)   Epigastric pain   Spinal cord inflammation   Hepatitis   Intractable nausea and vomiting  With transaminitis, epigastric pain which is transient, U/S showed postoperative biliary dilatation. Normal Tylenol level, possible viral gastritis. Symptomatic management with Reglan and Zofran. CT scan of abdomen pelvis showed no evidence of acute findings, symptoms resolved. Diet advanced to regular.   Has chronic AST/ALT elevation, Improving.  Possible UTI Patient had low-grade fever, urinalysis showed few bacteria and few pus cells. Started on Rocephin.  Hyponatremia and hypokalemia Lower oral intake, has nausea and vomiting. Started on normal saline, replete potassium with oral supplements.  Spinal cord inflammation. Chronic back pain. Patient had an MRI of her thoracic spine with and without contrast which did show a T2 enhancement of T3 and T4 vertebra. She does not have any focal deficit or any radiculopathy symptoms or her legs and does not have it lost any control of bowel or bladder or no saddle anesthesia. She also does not have any similar radiculopathy or focal deficit on her upper extremity. With this the MRI mentions that likely etiology does not appear to be acute in nature and over the phone discussion with neurology on-call also felt that this appears to be an incidental finding as  she does not have any radicular symptoms to explain these findings. With this the patient will be closely monitored with serial neuro checks and physical therapy consultation in the morning. Continue pain control and muscle relaxants, continue physical therapy.  Discussed with Dr. Nelva Bush PA, patient had recent MRI which showed chronic degenerative changes in the neck. She had steroids injection and C7-T1, recommend to repeat the MR of C-spine. If the MR C-spine showed no evidence of infection she can likely benefit from short-term steroids.  Essential hypertension with acceleration. Blood pressure mildly elevated likely secondary to the pain would continue hydralazine IV.   Code Status: Full Code Family Communication: Plan discussed with the patient. Disposition Plan: Remains inpatient Diet: Diet regular Room service appropriate?: Yes; Fluid consistency:: Thin  Consultants:  None  Procedures:  None  Antibiotics:  None   Objective: Filed Vitals:   06/12/15 1416  BP: 153/75  Pulse: 105  Temp: 98.3 F (36.8 C)  Resp: 18    Intake/Output Summary (Last 24 hours) at 06/12/15 1437 Last data filed at 06/12/15 1338  Gross per 24 hour  Intake    840 ml  Output    975 ml  Net   -135 ml   Filed Weights   06/10/15 0656 06/11/15 0505 06/12/15 0543  Weight: 61.462 kg (135 lb 8 oz) 60.827 kg (134 lb 1.6 oz) 60.011 kg (132 lb 4.8 oz)    Exam: General: Alert and awake, oriented x3, not in any acute distress. HEENT: anicteric sclera, pupils reactive to light and accommodation, EOMI CVS: S1-S2 clear, no murmur rubs or gallops Chest: clear to auscultation bilaterally, no wheezing, rales or rhonchi Abdomen: soft nontender, nondistended, normal bowel sounds, no organomegaly  Extremities: no cyanosis, clubbing or edema noted bilaterally Neuro: Cranial nerves II-XII intact, no focal neurological deficits  Data Reviewed: Basic Metabolic Panel:  Recent Labs Lab 06/08/15 2359  06/09/15 0721 06/10/15 0342 06/11/15 0400 06/12/15 0402  NA 133* 136 132* 132* 129*  K 3.8 4.0 3.3* 3.8 3.2*  CL 96* 101 96* 96* 90*  CO2 24 22 26 26 26   GLUCOSE 184* 136* 118* 113* 114*  BUN 10 9 7 10 14   CREATININE 0.83 0.76 0.59 0.68 0.61  CALCIUM 9.3 9.3 9.5 9.2 9.2   Liver Function Tests:  Recent Labs Lab 06/08/15 2359 06/09/15 0721 06/10/15 0342 06/11/15 0400 06/12/15 0402  AST 871* 321* 131* 83* 40  ALT 294* 232* 152* 106* 71*  ALKPHOS 382* 347* 295* 257* 217*  BILITOT 1.6* 1.4* 1.5* 1.5* 1.3*  PROT 7.7 7.5 7.2 7.2 6.7  ALBUMIN 4.3 4.0 3.9 3.5 3.3*    Recent Labs Lab 06/08/15 1438 06/09/15 0721  LIPASE 26 23   No results for input(s): AMMONIA in the last 168 hours. CBC:  Recent Labs Lab 06/08/15 1438 06/08/15 2359 06/09/15 0721 06/11/15 0451 06/12/15 0402  WBC 12.7* 16.7* 17.2* 11.6* 10.9*  NEUTROABS 9.7* 15.4* 15.2* 9.3*  --   HGB 12.8 13.1 12.3 11.8* 12.5  HCT 38.3 38.8 37.4 35.0* 36.7  MCV 89.3 88.8 90.1 87.7 87.8  PLT 305 279 330 271 279   Cardiac Enzymes: No results for input(s): CKTOTAL, CKMB, CKMBINDEX, TROPONINI in the last 168 hours. BNP (last 3 results) No results for input(s): BNP in the last 8760 hours.  ProBNP (last 3 results) No results for input(s): PROBNP in the last 8760 hours.  CBG: No results for input(s): GLUCAP in the last 168 hours.  Micro Recent Results (from the past 240 hour(s))  Culture, blood (x 2)     Status: None (Preliminary result)   Collection Time: 06/08/15 11:45 PM  Result Value Ref Range Status   Specimen Description BLOOD RIGHT ARM  Final   Special Requests BOTTLES DRAWN AEROBIC ONLY 5CC  Final   Culture NO GROWTH 3 DAYS  Final   Report Status PENDING  Incomplete  Culture, blood (routine x 2)     Status: None (Preliminary result)   Collection Time: 06/08/15 11:55 PM  Result Value Ref Range Status   Specimen Description BLOOD LEFT ARM  Final   Special Requests BOTTLES DRAWN AEROBIC AND ANAEROBIC  5CC  Final   Culture NO GROWTH 3 DAYS  Final   Report Status PENDING  Incomplete  Culture, blood (x 2)     Status: None (Preliminary result)   Collection Time: 06/09/15  2:41 AM  Result Value Ref Range Status   Specimen Description BLOOD LEFT HAND  Final   Special Requests BOTTLES DRAWN AEROBIC ONLY 5CC  Final   Culture NO GROWTH 3 DAYS  Final   Report Status PENDING  Incomplete     Studies: Mr Cervical Spine Wo Contrast  06/11/2015   CLINICAL DATA:  Neck pain.  EXAM: MRI CERVICAL SPINE WITHOUT CONTRAST  TECHNIQUE: Multiplanar, multisequence MR imaging of the cervical spine was performed. No intravenous contrast was administered.  COMPARISON:  None.  FINDINGS: Vertebral alignment is within normal limits. Vertebral body heights are preserved. There is moderate disc space narrowing at C5-6 and C6-7 with degenerative endplate changes and at most minimal marrow edema. C6-7 Schmorl's node deformity is noted. Craniocervical junction is unremarkable. 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 Mild T2 hyperintensity  in the pons is partially visualized, nonspecific but most often seen with chronic small vessel ischemia. Cervical spinal cord is normal in caliber and signal. Mild T2 hyperintensity is noted in the thoracic spinal cord at T3-4 as described on recent thoracic spine MRI. Paraspinal soft tissues are unremarkable.  C2-3:  No disc herniation or stenosis.  C3-4:  Mild left facet arthrosis without stenosis.  C4-5:  Negative.  C5-6: Broad-based posterior disc osteophyte complex results in mild bilateral neural foraminal stenosis without spinal stenosis.  C6-7: Broad-based posterior disc osteophyte complex without stenosis.  C7-T1: Small central disc protrusion results in a mild impression on the ventral spinal cord without significant stenosis.  IMPRESSION: 1. Small disc protrusion at C7-T1 slightly deforms the cord without stenosis. 2. Mild bilateral neural foraminal  stenosis at C5-6.   Electronically Signed   By: Logan Bores   On: 06/11/2015 19:35    Scheduled Meds: . amLODipine  5 mg Oral Daily  . cefTRIAXone (ROCEPHIN)  IV  1 g Intravenous Q24H  . clopidogrel  75 mg Oral Daily  . docusate sodium  100 mg Oral BID  . enoxaparin (LOVENOX) injection  40 mg Subcutaneous Q24H  . gi cocktail  30 mL Oral Once  . magnesium sulfate 1 - 4 g bolus IVPB  2 g Intravenous Once  . methylPREDNISolone (SOLU-MEDROL) injection  60 mg Intravenous 3 times per day  . pantoprazole (PROTONIX) IV  40 mg Intravenous Q12H  . potassium chloride  40 mEq Oral Q6H  . sodium chloride  3 mL Intravenous Q12H  . traZODone  25 mg Oral QHS   Continuous Infusions: . sodium chloride 100 mL/hr at 06/12/15 0945       Time spent: 35 minutes    [facility] A  [facility] Pager [phone] If 7PM-7AM, please contact night-coverage at [email or web address], password [facility] 06/12/2015, 2:37 PM  LOS: 4 days

## 2015-06-13 LAB — COMPREHENSIVE METABOLIC PANEL
ALT: 46 U/L (ref 14–54)
AST: 27 U/L (ref 15–41)
Albumin: 3.1 g/dL — ABNORMAL LOW (ref 3.5–5.0)
Alkaline Phosphatase: 183 U/L — ABNORMAL HIGH (ref 38–126)
Anion gap: 9 (ref 5–15)
BUN: 14 mg/dL (ref 6–20)
CO2: 23 mmol/L (ref 22–32)
Calcium: 9 mg/dL (ref 8.9–10.3)
Chloride: 100 mmol/L — ABNORMAL LOW (ref 101–111)
Creatinine, Ser: 0.85 mg/dL (ref 0.44–1.00)
GFR calc Af Amer: >60 mL/min (ref >60)
GFR calc non Af Amer: >60 mL/min (ref >60)
Glucose, Bld: 143 mg/dL — ABNORMAL HIGH (ref 65–99)
Potassium: 4.8 mmol/L (ref 3.5–5.1)
Sodium: 132 mmol/L — ABNORMAL LOW (ref 135–145)
Total Bilirubin: 1.1 mg/dL (ref 0.3–1.2)
Total Protein: 6.3 g/dL — ABNORMAL LOW (ref 6.5–8.1)

## 2015-06-13 LAB — MAGNESIUM: Magnesium: 2.1 mg/dL (ref 1.7–2.4)

## 2015-06-13 MED ORDER — PREDNISONE 10 MG (21) PO TBPK: ORAL_TABLET | ORAL | Status: DC

## 2015-06-13 MED ORDER — OXYCODONE HCL 5 MG PO TABS: 5.0000 mg | ORAL_TABLET | Freq: Four times a day (QID) | ORAL | Status: DC | PRN

## 2015-06-13 MED ORDER — PANTOPRAZOLE SODIUM 40 MG PO TBEC: 40.0000 mg | DELAYED_RELEASE_TABLET | Freq: Two times a day (BID) | ORAL | Status: DC

## 2015-06-13 MED ORDER — CEFUROXIME AXETIL 500 MG PO TABS: 500.0000 mg | ORAL_TABLET | Freq: Two times a day (BID) | ORAL | Status: DC

## 2015-06-13 NOTE — Discharge Summary (Signed)
Physician Discharge Summary  Kimberly Stone DJM:[id number] DOB: 1946/12/06 DOA: 06/08/2015  PCP: Jerlyn Ly, MD  Admit date: 06/08/2015 Discharge date: 06/13/2015  Time spent: 40 minutes  Recommendations for Outpatient Follow-up:  1. Follow-up with primary care physician in one week. 2. Follow-up with Dr. Nelva Bush in 1 week.  Discharge Diagnoses:  Principal Problem:   Intractable nausea and vomiting Active Problems:   HTN (hypertension)   Transaminitis   Intrahepatic bile duct dilation   CAD (coronary artery disease)   Epigastric pain   Spinal cord inflammation   Hepatitis   Discharge Condition: Stable  Diet recommendation: Heart healthy  Filed Weights   06/11/15 0505 06/12/15 0543 06/13/15 0612  Weight: 60.827 kg (134 lb 1.6 oz) 60.011 kg (132 lb 4.8 oz) 59.861 kg (131 lb 15.5 oz)    History of present illness:  Kimberly Stone is a 69 y.o. female with Past medical history of hypertension, coronary artery disease, status post gastric bypass and cholecystectomy, dyslipidemia, dilated CBD. The patient is presenting with complaints of abdominal pain which was located on the right upper abdomen associated with back pain which was located paraspinal all started today. Patient mentions she has chronic back pain as well as chronic neck pain which has been ongoing since last few month and she has been following up with her PCP and was recently seen by orthopedics who has decided to do an epidural injection for her for pain management. She also has been complaining of pain in her lower ribs bilaterally in a bandlike fashion ongoing since last few weeks occurring on and off. Today while she was trying to eat she suddenly started having complaints of pain on the right side of her stomach starting from her back and then radiating all the way to the front. She also started having nausea and vomiting. With this she was severely in pain was incapacitated and therefore brought to the  hospital. The pain she describes is a sharp pain and 10 out of 10 in intensity. She has been in the hospital since many hours and has been given multiple pain medication despite which her symptoms have not improved. She continues to have vomiting with nonbloody bilious material. She denies any diarrhea or constipation she denies any burning urination she denies any fever or chills at home she denies any cough she denies any recent travel as he denies any recent outside food she denies any rash anywhere she denies any new medication on her. She denies having similar symptoms in the past. She denies any sick contact. She denies any smoking she denies any alcohol abuse. She denies any Tylenol abuse.  The patient is coming from home. And at her baseline independent for most of her ADL.  Hospital Course:   Intractable nausea and vomiting  With transaminitis, epigastric pain which is transient, U/S showed postoperative biliary dilatation.  Normal Tylenol level, possible viral gastritis. Symptomatic management with Reglan and Zofran.  CT scan of abdomen pelvis showed no evidence of acute findings, symptoms resolved. Diet advanced to regular.  Transaminitis resolved.  Possible UTI  Patient had low-grade fever, urinalysis showed few bacteria and few pus cells.  Started on Rocephin.   Hyponatremia and hypokalemia  Lower oral intake, has nausea and vomiting.  Started on normal saline, replete potassium with oral supplements. This is resolved.   Spinal cord inflammation.  Chronic back pain.  Patient had an MRI of her thoracic spine with and without contrast which did show a T2 enhancement of  T3 and T4 vertebra.  She does not have any focal deficit or any radiculopathy symptoms or her legs and does not have it lost any control of bowel or bladder or no saddle anesthesia.  She also does not have any similar radiculopathy or focal deficit on her upper extremity.  With this the MRI mentions that  likely etiology does not appear to be acute in nature and over the phone discussion with neurology on-call also felt that this appears to be an incidental finding as she does not have any radicular symptoms to explain these findings.  With this the patient will be closely monitored with serial neuro checks and physical therapy consultation in the morning.  Continue pain control and muscle relaxants, continue physical therapy.  Discussed with Dr. Nelva Bush' PA, patient had recent MRI which showed chronic degenerative changes in the neck.  She had steroids injection and C7-T1, recommend to repeat the MR of C-spine.  MRI of C-spine repeated and showed no evidence of infection or hematoma, placed on steroids taper.  Essential hypertension with acceleration.  Blood pressure mildly elevated likely secondary to the pain would continue hydralazine IV.   UTI Possible UTI, initial urinalysis on admission was clear, repeat urinalysis showed 3-6 pus cells. Patient started on antibiotics and discharged on Ceftin for 5 more days.  Overall presentation was very atypical, patient has had very vague nonspecific symptoms including nausea, vomiting. She has had neck, back and flanks pain, thought to be radicular pain. Patient reporting 7-10/10 pain all the time. Needs to recheck with her primary care physician and her pain doctor. Has negative MR of C/L spine, negative CT scan of abdomen, negative ultrasound. I did not know why she did have transaminitis may be because of nausea and vomiting.  Procedures:  None  Consultations:  None  Discharge Exam: Filed Vitals:   06/13/15 0612  BP: 128/64  Pulse: 84  Temp: 98.2 F (36.8 C)  Resp: 18   General: Alert and awake, oriented x3, not in any acute distress. HEENT: anicteric sclera, pupils reactive to light and accommodation, EOMI CVS: S1-S2 clear, no murmur rubs or gallops Chest: clear to auscultation bilaterally, no wheezing, rales or rhonchi Abdomen:  soft nontender, nondistended, normal bowel sounds, no organomegaly Extremities: no cyanosis, clubbing or edema noted bilaterally Neuro: Cranial nerves II-XII intact, no focal neurological deficits  Discharge Instructions   Discharge Instructions    Diet - low sodium heart healthy    Complete by:  As directed      Increase activity slowly    Complete by:  As directed           Current Discharge Medication List    START taking these medications   Details  cefUROXime (CEFTIN) 500 MG tablet Take 1 tablet (500 mg total) by mouth 2 (two) times daily with a meal. Qty: 10 tablet, Refills: 0    oxyCODONE (OXY IR/ROXICODONE) 5 MG immediate release tablet Take 1-2 tablets (5-10 mg total) by mouth every 6 (six) hours as needed for breakthrough pain. Qty: 15 tablet, Refills: 0    predniSONE (STERAPRED UNI-PAK 21 TAB) 10 MG (21) TBPK tablet Take 6-5-4-3-2-1 tablet PO daily till gone Qty: 21 tablet, Refills: 0      CONTINUE these medications which have NOT CHANGED   Details  acetaminophen (TYLENOL) 650 MG CR tablet Take 650 mg by mouth as needed for pain.     amLODipine (NORVASC) 5 MG tablet Take 5 mg by mouth daily with breakfast.  clopidogrel (PLAVIX) 75 MG tablet TAKE 1 TABLET BY MOUTH EVERY DAY Qty: 30 tablet, Refills: 10    !! diclofenac sodium (VOLTAREN) 1 % GEL as needed.    !! diclofenac sodium (VOLTAREN) 1 % GEL Apply 1 application topically 4 (four) times daily as needed. FOR BACK PAIN    enalapril (VASOTEC) 5 MG tablet Take 5 mg by mouth daily with breakfast.    escitalopram (LEXAPRO) 10 MG tablet Take 10 mg by mouth daily.     gabapentin (NEURONTIN) 300 MG capsule Take 300 mg by mouth at bedtime.    Multiple Vitamin (MULTIVITAMIN WITH MINERALS) TABS Take 2 tablets by mouth daily.    nitroGLYCERIN (NITROSTAT) 0.4 MG SL tablet Place 1 tablet (0.4 mg total) under the tongue every 5 (five) minutes as needed for chest pain. Qty: 30 tablet, Refills: 0    pantoprazole  (PROTONIX) 40 MG tablet Take 40 mg by mouth daily with breakfast.    PRESCRIPTION MEDICATION Inject 1 application into the muscle every 14 (fourteen) days. "REPARTHA" INJECTION    Vitamin D, Ergocalciferol, (DRISDOL) 50000 UNITS CAPS Take 50,000 Units by mouth every Monday, Wednesday, and Friday.     estradiol (ESTRACE) 0.1 MG/GM vaginal cream Use 1/2 g vaginally twice weekly Qty: 42.5 g, Refills: 3     !! - Potential duplicate medications found. Please discuss with provider.     Allergies  Allergen Reactions  . Aspirin Other (See Comments)    PT CANNOT TAKE DUE TO HX OF GASTRIC BYPASS SURGERY   . Imodium [Loperamide] Palpitations  . Erythromycin Other (See Comments)    UNKNOWN   Follow-up Information    Follow up with PERINI,MARK A, MD In 1 week.   Specialty:  Internal Medicine   Why:  Hospital follow up   Contact information:   [address] [place] [address] [phone]       Follow up with [facility].   Specialty:  [place]   Why:  They will provide your home health physical therapy at your home   Contact information:   [place] [address] [phone]       Follow up with [facility].   Specialty:  [place]   Why:  they will provide your home health care at your home   Contact information:   [place]  [address] [phone]        The results of significant diagnostics from this hospitalization (including imaging, microbiology, ancillary and laboratory) are listed below for reference.    Significant Diagnostic Studies: Dg Chest 2 View  06/09/2015   CLINICAL DATA:  Patient with abdominal and back pain.  Vomiting.  EXAM: CHEST  2 VIEW  COMPARISON:  Chest radiograph 01/03/2014  FINDINGS: Stable cardiac and mediastinal contours. No consolidative pulmonary opacities. No pleural effusion or pneumothorax. Cholecystectomy clips. Mid thoracic spine degenerative changes.   IMPRESSION: No acute cardiopulmonary process.   Electronically Signed   By: Lovey Newcomer M.D.   On: 06/09/2015 00:24   Mr Cervical Spine Wo Contrast  06/11/2015   CLINICAL DATA:  Neck pain.  EXAM: MRI CERVICAL SPINE WITHOUT CONTRAST  TECHNIQUE: Multiplanar, multisequence MR imaging of the cervical spine was performed. No intravenous contrast was administered.  COMPARISON:  None.  FINDINGS: Vertebral alignment is within normal limits. Vertebral body heights are preserved. There is moderate disc space narrowing at C5-6 and C6-7 with degenerative endplate changes and at most minimal marrow edema. C6-7 Schmorl's  node deformity is noted. Craniocervical junction is unremarkable. 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 Mild T2 hyperintensity in the pons is partially visualized, nonspecific but most often seen with chronic small vessel ischemia. Cervical spinal cord is normal in caliber and signal. Mild T2 hyperintensity is noted in the thoracic spinal cord at T3-4 as described on recent thoracic spine MRI. Paraspinal soft tissues are unremarkable.  C2-3:  No disc herniation or stenosis.  C3-4:  Mild left facet arthrosis without stenosis.  C4-5:  Negative.  C5-6: Broad-based posterior disc osteophyte complex results in mild bilateral neural foraminal stenosis without spinal stenosis.  C6-7: Broad-based posterior disc osteophyte complex without stenosis.  C7-T1: Small central disc protrusion results in a mild impression on the ventral spinal cord without significant stenosis.  IMPRESSION: 1. Small disc protrusion at C7-T1 slightly deforms the cord without stenosis. 2. Mild bilateral neural foraminal stenosis at C5-6.   Electronically Signed   By: Logan Bores   On: 06/11/2015 19:35   Mr Thoracic Spine W Wo Contrast  06/08/2015   CLINICAL DATA:  Patient experienced sudden onset of RIGHT upper quadrant abdominal pain radiating to the RIGHT shoulder in middle of the back. This occurred  earlier today.  EXAM: MRI THORACIC SPINE WITHOUT AND WITH CONTRAST  TECHNIQUE: Multiplanar and multiecho pulse sequences of the thoracic spine were obtained without and with intravenous contrast.  CONTRAST:  3mL MULTIHANCE GADOBENATE DIMEGLUMINE 529 MG/ML IV SOLN  COMPARISON:  CT abdomen and pelvis 06/08/2015.  CT chest 01/03/2014.  FINDINGS: Numbering of the thoracic spine was accomplished by counting down from the odontoid.  There is a greater than 10 degrees scoliosis convex RIGHT centered in the mid thoracic region. There is no worrisome vertebral body abnormality. Paravertebral soft tissues are unremarkable. No thoracic disc protrusion, but there is a tiny protrusion at C7-T1 which appears noncompressive.  In the upper thoracic region, the cord is mildly displaced RIGHT to LEFT. There is a possible intradural extramedullary 5 mm rounded structure at the T4 level which could contribute to mild RIGHT to LEFT cord displacement. While it is possible this represents flow artifact, on most of the images, it appears to be a real finding. There is slight T2 hyperintensity of the cord opposite T3 and T4, without cord enlargement. Significance is uncertain. No peripheral cord hyperintensity to suggest demyelinating lesion. No hydromyelia is evident or apparent tonsillar descent. Post infusion images do not reveal abnormal enhancement intraspinal contents or cord.  Note is made of marked esophageal enlargement in the mid thoracic region, with an air-fluid level. See for instance image 18 of multiple axial series. Considerations for this appearance include gastroesophageal reflux or distal esophageal stricture. This could contribute to chest pain.  IMPRESSION: Mild scoliosis convex RIGHT centered in mid thoracic region.  Possible intradural extramedullary 5 mm rounded structure at the T4 level which could contribute to slight cord displacement. This likely represents a benign incidental long-standing structure such as a  small nonenhancing meningioma or even intradural synovial cyst.  Slight cord T2 hyperintensity opposite T3 and T4 without enlargement or enhancement, of uncertain significance. Neoplasm, myelitis, demyelinating disease, or remote insult are considerations. It is unlikely this represents an acute phenomenon.  Marked esophageal enlargement in the mid thoracic lesion with an air-fluid level. Considerations include GE reflux or distal esophageal stricture.   Electronically Signed   By: Staci Righter M.D.   On: 06/08/2015 21:32   Ct Abdomen Pelvis W Contrast  06/08/2015  CLINICAL DATA:  Abdominal pain. Sudden onset of RIGHT upper quadrant pain radiating to the back and shoulder. Surgical history of gastric bypass.  EXAM: CT ABDOMEN AND PELVIS WITH CONTRAST  TECHNIQUE: Multidetector CT imaging of the abdomen and pelvis was performed using the standard protocol following bolus administration of intravenous contrast.  CONTRAST:  166mL OMNIPAQUE IOHEXOL 300 MG/ML  SOLN  COMPARISON:  MRCP 04/13/2014.  FINDINGS: Musculoskeletal: No aggressive osseous lesions. Benign hemangioma in the L4 vertebra. Scattered Schmorl's nodes. Thoracolumbar vertebral body height is preserved. RIGHT L5-S1 severe facet arthrosis.  Lung Bases: Respiratory motion artifact. Linear scarring or atelectasis in the lingula.  Liver: Mild intrahepatic biliary ductal dilation appears similar to previous MRCP and CT abdomen. No mass lesion.  Spleen:  Normal.  Gallbladder:  Cholecystectomy.  Common bile duct: Postcholecystectomy dilation of the biliary system. No calcified common duct stone is identified. No changed and common bile duct diameter.  Pancreas:  Normal.  Adrenal glands:  Normal bilaterally.  Kidneys: Normal enhancement and excretion of contrast. Tiny low-density lesion in the interpolar LEFT kidney likely representing a small cyst. LEFT ureter normal. RIGHT ureter normal.  Stomach: Small hiatal hernia. Postsurgical changes of gastric bypass.  Normal opacification of the gastrojejunostomy with oral contrast.  Small bowel:  No small bowel obstruction or complicating features.  Colon: High position of the cecum. Colon appears within normal limits.  Pelvic Genitourinary: Distended urinary bladder. Hysterectomy. Laxity of the pelvic floor.  Peritoneum: No free air or free fluid.  Vascular/lymphatic: Mild atherosclerosis. No acute vascular abnormality.  Body Wall: Normal.  IMPRESSION: 1. Uncomplicated gastric bypass. 2. Small hiatal hernia. 3. Cholecystectomy with chronic postcholecystectomy the intra and extrahepatic biliary ductal dilation. No interval change. 4. Hysterectomy.   Electronically Signed   By: Dereck Ligas M.D.   On: 06/08/2015 17:24   US Abdomen Limited Ruq  06/09/2015   CLINICAL DATA:  Right upper quadrant pain, elevated LFTs, status post cholecystectomy  EXAM: US ABDOMEN LIMITED - RIGHT UPPER QUADRANT  COMPARISON:  CT abdomen pelvis dated 06/08/2015  FINDINGS: Gallbladder:  Surgically absent.  Common bile duct:  Diameter: 7 mm.  Liver:  Mild intrahepatic ductal dilatation. No focal hepatic lesion is seen.  IMPRESSION: Status post cholecystectomy. Mild intrahepatic ductal dilatation, chronic. Common duct measures 7 mm, at the upper limits of normal.   Electronically Signed   By: Julian Hy M.D.   On: 06/09/2015 10:10    Microbiology: Recent Results (from the past 240 hour(s))  Culture, blood (x 2)     Status: None (Preliminary result)   Collection Time: 06/08/15 11:45 PM  Result Value Ref Range Status   Specimen Description BLOOD RIGHT ARM  Final   Special Requests BOTTLES DRAWN AEROBIC ONLY 5CC  Final   Culture NO GROWTH 3 DAYS  Final   Report Status PENDING  Incomplete  Culture, blood (routine x 2)     Status: None (Preliminary result)   Collection Time: 06/08/15 11:55 PM  Result Value Ref Range Status   Specimen Description BLOOD LEFT ARM  Final   Special Requests BOTTLES DRAWN AEROBIC AND ANAEROBIC 5CC  Final    Culture NO GROWTH 3 DAYS  Final   Report Status PENDING  Incomplete  Culture, blood (x 2)     Status: None (Preliminary result)   Collection Time: 06/09/15  2:41 AM  Result Value Ref Range Status   Specimen Description BLOOD LEFT HAND  Final   Special Requests BOTTLES DRAWN AEROBIC ONLY 5CC  Final  Culture NO GROWTH 3 DAYS  Final   Report Status PENDING  Incomplete     Labs: Basic Metabolic Panel:  Recent Labs Lab 06/09/15 0721 06/10/15 0342 06/11/15 0400 06/12/15 0402 06/13/15 0254  NA 136 132* 132* 129* 132*  K 4.0 3.3* 3.8 3.2* 4.8  CL 101 96* 96* 90* 100*  CO2 22 26 26 26 23   GLUCOSE 136* 118* 113* 114* 143*  BUN 9 7 10 14 14   CREATININE 0.76 0.59 0.68 0.61 0.85  CALCIUM 9.3 9.5 9.2 9.2 9.0  MG  --   --   --   --  2.1   Liver Function Tests:  Recent Labs Lab 06/09/15 0721 06/10/15 0342 06/11/15 0400 06/12/15 0402 06/13/15 0254  AST 321* 131* 83* 40 27  ALT 232* 152* 106* 71* 46  ALKPHOS 347* 295* 257* 217* 183*  BILITOT 1.4* 1.5* 1.5* 1.3* 1.1  PROT 7.5 7.2 7.2 6.7 6.3*  ALBUMIN 4.0 3.9 3.5 3.3* 3.1*    Recent Labs Lab 06/08/15 1438 06/09/15 0721  LIPASE 26 23   No results for input(s): AMMONIA in the last 168 hours. CBC:  Recent Labs Lab 06/08/15 1438 06/08/15 2359 06/09/15 0721 06/11/15 0451 06/12/15 0402  WBC 12.7* 16.7* 17.2* 11.6* 10.9*  NEUTROABS 9.7* 15.4* 15.2* 9.3*  --   HGB 12.8 13.1 12.3 11.8* 12.5  HCT 38.3 38.8 37.4 35.0* 36.7  MCV 89.3 88.8 90.1 87.7 87.8  PLT 305 279 330 271 279   Cardiac Enzymes: No results for input(s): CKTOTAL, CKMB, CKMBINDEX, TROPONINI in the last 168 hours. BNP: BNP (last 3 results) No results for input(s): BNP in the last 8760 hours.  ProBNP (last 3 results) No results for input(s): PROBNP in the last 8760 hours.  CBG: No results for input(s): GLUCAP in the last 168 hours.     Signed:  Elysse Polidore A  [facility] 06/13/2015, 11:04 AM

## 2015-06-14 DIAGNOSIS — M179 Osteoarthritis of knee, unspecified: Secondary | ICD-10-CM | POA: Diagnosis not present

## 2015-06-14 DIAGNOSIS — G0491 Myelitis, unspecified: Secondary | ICD-10-CM | POA: Diagnosis not present

## 2015-06-14 DIAGNOSIS — Z889 Allergy status to unspecified drugs, medicaments and biological substances status: Secondary | ICD-10-CM | POA: Diagnosis not present

## 2015-06-14 DIAGNOSIS — K219 Gastro-esophageal reflux disease without esophagitis: Secondary | ICD-10-CM | POA: Diagnosis not present

## 2015-06-14 DIAGNOSIS — F329 Major depressive disorder, single episode, unspecified: Secondary | ICD-10-CM | POA: Diagnosis not present

## 2015-06-14 DIAGNOSIS — F419 Anxiety disorder, unspecified: Secondary | ICD-10-CM | POA: Diagnosis not present

## 2015-06-14 DIAGNOSIS — K759 Inflammatory liver disease, unspecified: Secondary | ICD-10-CM | POA: Diagnosis not present

## 2015-06-14 DIAGNOSIS — M797 Fibromyalgia: Secondary | ICD-10-CM | POA: Diagnosis not present

## 2015-06-14 DIAGNOSIS — Z9181 History of falling: Secondary | ICD-10-CM | POA: Diagnosis not present

## 2015-06-14 DIAGNOSIS — I2510 Atherosclerotic heart disease of native coronary artery without angina pectoris: Secondary | ICD-10-CM | POA: Diagnosis not present

## 2015-06-14 DIAGNOSIS — I10 Essential (primary) hypertension: Secondary | ICD-10-CM | POA: Diagnosis not present

## 2015-06-14 LAB — CULTURE, BLOOD (ROUTINE X 2)
Culture: NO GROWTH
Culture: NO GROWTH
Culture: NO GROWTH

## 2015-06-14 LAB — URINE CULTURE

## 2015-06-17 DIAGNOSIS — F329 Major depressive disorder, single episode, unspecified: Secondary | ICD-10-CM | POA: Diagnosis not present

## 2015-06-17 DIAGNOSIS — K759 Inflammatory liver disease, unspecified: Secondary | ICD-10-CM | POA: Diagnosis not present

## 2015-06-17 DIAGNOSIS — M797 Fibromyalgia: Secondary | ICD-10-CM | POA: Diagnosis not present

## 2015-06-17 DIAGNOSIS — I10 Essential (primary) hypertension: Secondary | ICD-10-CM | POA: Diagnosis not present

## 2015-06-17 DIAGNOSIS — F419 Anxiety disorder, unspecified: Secondary | ICD-10-CM | POA: Diagnosis not present

## 2015-06-17 DIAGNOSIS — G0491 Myelitis, unspecified: Secondary | ICD-10-CM | POA: Diagnosis not present

## 2015-06-22 DIAGNOSIS — R945 Abnormal results of liver function studies: Secondary | ICD-10-CM | POA: Diagnosis not present

## 2015-06-22 DIAGNOSIS — M797 Fibromyalgia: Secondary | ICD-10-CM | POA: Diagnosis not present

## 2015-06-22 DIAGNOSIS — I10 Essential (primary) hypertension: Secondary | ICD-10-CM | POA: Diagnosis not present

## 2015-06-22 DIAGNOSIS — F329 Major depressive disorder, single episode, unspecified: Secondary | ICD-10-CM | POA: Diagnosis not present

## 2015-06-22 DIAGNOSIS — K759 Inflammatory liver disease, unspecified: Secondary | ICD-10-CM | POA: Diagnosis not present

## 2015-06-22 DIAGNOSIS — F419 Anxiety disorder, unspecified: Secondary | ICD-10-CM | POA: Diagnosis not present

## 2015-06-22 DIAGNOSIS — G0491 Myelitis, unspecified: Secondary | ICD-10-CM | POA: Diagnosis not present

## 2015-06-23 ENCOUNTER — Telehealth: Payer: Self-pay | Admitting: Nurse Practitioner

## 2015-06-23 ENCOUNTER — Ambulatory Visit: Payer: Medicare Other | Admitting: Nurse Practitioner

## 2015-06-23 NOTE — Telephone Encounter (Signed)
Patient called and cancelled her appointment for her AEX today, She said, "I have been in the hospital for a nerve block that accidentally hit my spine." She rescheduled to 08/27/15.

## 2015-06-28 DIAGNOSIS — M797 Fibromyalgia: Secondary | ICD-10-CM | POA: Diagnosis not present

## 2015-06-28 DIAGNOSIS — G0491 Myelitis, unspecified: Secondary | ICD-10-CM | POA: Diagnosis not present

## 2015-06-28 DIAGNOSIS — K759 Inflammatory liver disease, unspecified: Secondary | ICD-10-CM | POA: Diagnosis not present

## 2015-06-28 DIAGNOSIS — F419 Anxiety disorder, unspecified: Secondary | ICD-10-CM | POA: Diagnosis not present

## 2015-06-28 DIAGNOSIS — F329 Major depressive disorder, single episode, unspecified: Secondary | ICD-10-CM | POA: Diagnosis not present

## 2015-06-28 DIAGNOSIS — I10 Essential (primary) hypertension: Secondary | ICD-10-CM | POA: Diagnosis not present

## 2015-07-02 DIAGNOSIS — F329 Major depressive disorder, single episode, unspecified: Secondary | ICD-10-CM | POA: Diagnosis not present

## 2015-07-02 DIAGNOSIS — F419 Anxiety disorder, unspecified: Secondary | ICD-10-CM | POA: Diagnosis not present

## 2015-07-02 DIAGNOSIS — K759 Inflammatory liver disease, unspecified: Secondary | ICD-10-CM | POA: Diagnosis not present

## 2015-07-02 DIAGNOSIS — G0491 Myelitis, unspecified: Secondary | ICD-10-CM | POA: Diagnosis not present

## 2015-07-02 DIAGNOSIS — M797 Fibromyalgia: Secondary | ICD-10-CM | POA: Diagnosis not present

## 2015-07-02 DIAGNOSIS — I10 Essential (primary) hypertension: Secondary | ICD-10-CM | POA: Diagnosis not present

## 2015-07-07 DIAGNOSIS — Z6824 Body mass index (BMI) 24.0-24.9, adult: Secondary | ICD-10-CM | POA: Diagnosis not present

## 2015-07-07 DIAGNOSIS — R112 Nausea with vomiting, unspecified: Secondary | ICD-10-CM | POA: Diagnosis not present

## 2015-07-07 DIAGNOSIS — M859 Disorder of bone density and structure, unspecified: Secondary | ICD-10-CM | POA: Diagnosis not present

## 2015-07-07 DIAGNOSIS — K219 Gastro-esophageal reflux disease without esophagitis: Secondary | ICD-10-CM | POA: Diagnosis not present

## 2015-07-07 DIAGNOSIS — M546 Pain in thoracic spine: Secondary | ICD-10-CM | POA: Diagnosis not present

## 2015-07-07 DIAGNOSIS — R945 Abnormal results of liver function studies: Secondary | ICD-10-CM | POA: Diagnosis not present

## 2015-07-12 DIAGNOSIS — M797 Fibromyalgia: Secondary | ICD-10-CM | POA: Diagnosis not present

## 2015-07-12 DIAGNOSIS — I10 Essential (primary) hypertension: Secondary | ICD-10-CM | POA: Diagnosis not present

## 2015-07-12 DIAGNOSIS — Z6823 Body mass index (BMI) 23.0-23.9, adult: Secondary | ICD-10-CM | POA: Diagnosis not present

## 2015-07-12 DIAGNOSIS — M5380 Other specified dorsopathies, site unspecified: Secondary | ICD-10-CM | POA: Diagnosis not present

## 2015-07-12 DIAGNOSIS — M546 Pain in thoracic spine: Secondary | ICD-10-CM | POA: Diagnosis not present

## 2015-07-12 DIAGNOSIS — R945 Abnormal results of liver function studies: Secondary | ICD-10-CM | POA: Diagnosis not present

## 2015-07-26 ENCOUNTER — Other Ambulatory Visit (HOSPITAL_COMMUNITY): Payer: Self-pay | Admitting: Physician Assistant

## 2015-07-26 DIAGNOSIS — R112 Nausea with vomiting, unspecified: Secondary | ICD-10-CM

## 2015-07-26 DIAGNOSIS — I2510 Atherosclerotic heart disease of native coronary artery without angina pectoris: Secondary | ICD-10-CM | POA: Diagnosis not present

## 2015-07-26 DIAGNOSIS — R1013 Epigastric pain: Secondary | ICD-10-CM | POA: Diagnosis not present

## 2015-07-26 DIAGNOSIS — Z9889 Other specified postprocedural states: Secondary | ICD-10-CM | POA: Diagnosis not present

## 2015-07-28 DIAGNOSIS — R740 Nonspecific elevation of levels of transaminase and lactic acid dehydrogenase [LDH]: Secondary | ICD-10-CM | POA: Diagnosis not present

## 2015-08-05 ENCOUNTER — Ambulatory Visit (HOSPITAL_COMMUNITY)
Admission: RE | Admit: 2015-08-05 | Discharge: 2015-08-05 | Disposition: A | Discharge status: Home / Self Care | Payer: Medicare Other | Source: Ambulatory Visit | Attending: Physician Assistant | Admitting: Physician Assistant

## 2015-08-05 DIAGNOSIS — Z9884 Bariatric surgery status: Secondary | ICD-10-CM | POA: Insufficient documentation

## 2015-08-05 DIAGNOSIS — R112 Nausea with vomiting, unspecified: Secondary | ICD-10-CM | POA: Diagnosis not present

## 2015-08-05 DIAGNOSIS — R6881 Early satiety: Secondary | ICD-10-CM | POA: Insufficient documentation

## 2015-08-05 DIAGNOSIS — R1110 Vomiting, unspecified: Secondary | ICD-10-CM | POA: Diagnosis not present

## 2015-08-05 MED ORDER — TECHNETIUM TC 99M SULFUR COLLOID
2.0000 | Freq: Once | INTRAVENOUS | Status: DC | PRN
  Administered 2015-08-05: 2 via ORAL
  Filled 2015-08-05: qty 2

## 2015-08-19 DIAGNOSIS — R1013 Epigastric pain: Secondary | ICD-10-CM | POA: Diagnosis not present

## 2015-08-27 ENCOUNTER — Encounter: Payer: Self-pay | Admitting: Nurse Practitioner

## 2015-08-27 ENCOUNTER — Ambulatory Visit (INDEPENDENT_AMBULATORY_CARE_PROVIDER_SITE_OTHER): Payer: Medicare Other | Admitting: Nurse Practitioner

## 2015-08-27 VITALS — BP 114/74 | HR 68 | Ht 61.75 in | Wt 138.0 lb

## 2015-08-27 DIAGNOSIS — N952 Postmenopausal atrophic vaginitis: Secondary | ICD-10-CM

## 2015-08-27 DIAGNOSIS — I10 Essential (primary) hypertension: Secondary | ICD-10-CM

## 2015-08-27 DIAGNOSIS — Z124 Encounter for screening for malignant neoplasm of cervix: Secondary | ICD-10-CM | POA: Diagnosis not present

## 2015-08-27 DIAGNOSIS — Z01419 Encounter for gynecological examination (general) (routine) without abnormal findings: Secondary | ICD-10-CM

## 2015-08-27 MED ORDER — ESTRADIOL 0.1 MG/GM VA CREA: TOPICAL_CREAM | VAGINAL | Status: DC

## 2015-08-27 NOTE — Progress Notes (Signed)
Patient ID: Kimberly Stone, female   DOB: 01-Mar-1946, 69 y.o.   MRN: [id number] 69 y.o. G2P0002 Married  Caucasian Fe here for annual exam. She is doing well now.   Had nerve spinal block for right shoulder pain, then a week later had very bad HA.  She was then diagnosed with a spinal HA and had elevated WBC.  She was on antibiotics and steroids. She has wt loss of 18 lbs and unable to do daily functions afterwards until PT was initiated and 3 weeks before walking on her own.  Patient's last menstrual period was 03/18/1972 (approximate).          Sexually active: No.  The current method of family planning is status post hysterectomy.    Exercising: No.  The patient does not participate in regular exercise at present. Smoker:  no  Health Maintenance: Pap: 05/2002, Negative MMG: 01/15/14, 3D with 3D Diagnostic Left; Bi-Rads 1:  Negative repeated this; this year and ROI is signed to be sent Colonoscopy: 2014 - Normal, repeat in 10 years BMD:2016 with PCP, osteopenia of the spine per patient TDaP: 2010 Shingles Vaccine: age 34 Labs:  06/13/15 in [facility]   reports that she quit smoking about 18 years ago. Her smoking use included Cigarettes. She has a 1.25 pack-year smoking history. She has never used smokeless tobacco. She reports that she drinks alcohol. She reports that she does not use illicit drugs.  Past Medical History  Diagnosis Date  . Hypertension   . Coronary artery disease   . Depression   . GERD (gastroesophageal reflux disease)   . Fibromyalgia   . Arthritis   . Anemia     from bypass- iron and B12  . Acute MI 11/2004    NONSTEMI 2005 NOINTERVENTION PER NOTE   . Post-menopausal   . Scoliosis   . Hyperlipidemia   . Obesity     hx of bipass, now resolved.  borderline diabetes also resolved.  Marland Kitchen GIB (gastrointestinal bleeding)     while on celecoxib post bypass  . Anxiety   . Hyperlipidemia LDL goal < 130 01/23/2014  . Statin intolerance 01/23/2014    Past Surgical History   Procedure Laterality Date  . Gastric bypass  01/2004  . Cardiac catheterization      2005 Severely disease small optional diagonal vessel responsible far wall motion abnormalities wise to small and diffusely diseased for consideration of interventio.  . Colonoscopy    . Laparoscopic cholecystectomy  1993  . Total vaginal hysterectomy  1973    secondary to DUB - Dr. Connye Burkitt and Dr. Tamala Julian  . Knee arthroscopy Right   . Cataract extraction      Current Outpatient Prescriptions  Medication Sig Dispense Refill  . acetaminophen (TYLENOL) 650 MG CR tablet Take 650 mg by mouth as needed for pain.     Marland Kitchen amLODipine (NORVASC) 5 MG tablet Take 5 mg by mouth daily with breakfast.    . clopidogrel (PLAVIX) 75 MG tablet TAKE 1 TABLET BY MOUTH EVERY DAY 30 tablet 10  . diclofenac sodium (VOLTAREN) 1 % GEL Apply 1 application topically 4 (four) times daily as needed. FOR BACK PAIN    . enalapril (VASOTEC) 5 MG tablet Take 5 mg by mouth daily with breakfast.    . estradiol (ESTRACE) 0.1 MG/GM vaginal cream Use 1/2 g vaginally twice weekly 42.5 g 3  . gabapentin (NEURONTIN) 300 MG capsule Take 300 mg by mouth at bedtime.    . metoCLOPramide (REGLAN)  5 MG tablet Take 5 mg by mouth 3 (three) times daily before meals.    . Multiple Vitamin (MULTIVITAMIN WITH MINERALS) TABS Take 2 tablets by mouth daily.    . nitroGLYCERIN (NITROSTAT) 0.4 MG SL tablet Place 1 tablet (0.4 mg total) under the tongue every 5 (five) minutes as needed for chest pain. 30 tablet 0  . oxyCODONE (OXY IR/ROXICODONE) 5 MG immediate release tablet Take 1-2 tablets (5-10 mg total) by mouth every 6 (six) hours as needed for breakthrough pain. 15 tablet 0  . pantoprazole (PROTONIX) 40 MG tablet Take 40 mg by mouth daily with breakfast.    . PRESCRIPTION MEDICATION Inject 1 application into the muscle every 14 (fourteen) days. "REPARTHA" INJECTION    . Vitamin D, Ergocalciferol, (DRISDOL) 50000 UNITS CAPS Take 50,000 Units by mouth every Monday,  Wednesday, and Friday.      No current facility-administered medications for this visit.    Family History  Problem Relation Age of Onset  . Heart failure Mother   . Liver cancer Father   . Lung cancer Brother 38  . Cancer Brother   . Heart failure Maternal Grandmother   . Aneurysm Maternal Grandmother     ruptured abdominal aortic aneurysm    ROS:  Pertinent items are noted in HPI.  Otherwise, a comprehensive ROS was negative.  Exam:   BP 114/74 mmHg  Pulse 68  Ht 5' 1.75" (1.568 m)  Wt 138 lb (62.596 kg)  BMI 25.46 kg/m2  LMP 03/18/1972 (Approximate) Height: 5' 1.75" (156.8 cm) Ht Readings from Last 3 Encounters:  08/27/15 5' 1.75" (1.568 m)  06/09/15 5\' 2"  (1.575 m)  02/03/15 5\' 2"  (1.575 m)    General appearance: alert, cooperative and appears stated age Head: Normocephalic, without obvious abnormality, atraumatic Neck: no adenopathy, supple, symmetrical, trachea midline and thyroid normal to inspection and palpation Lungs: clear to auscultation bilaterally Breasts: normal appearance, no masses or tenderness Heart: regular rate and rhythm Abdomen: soft, non-tender; no masses,  no organomegaly Extremities: extremities normal, atraumatic, no cyanosis or edema Skin: Skin color, texture, turgor normal. No rashes or lesions Lymph nodes: Cervical, supraclavicular, and axillary nodes normal. No abnormal inguinal nodes palpated Neurologic: Grossly normal   Pelvic: External genitalia:  no lesions              Urethra:  prolapsed appearing urethra with no masses, tenderness or lesions              Bartholin's and Skene's: normal                 Vagina: atrophic appearing vagina with normal color and discharge, no lesions              Cervix: absent              Pap taken: No. Bimanual Exam:  Uterus:  uterus absent              Adnexa: no mass, fullness, tenderness               Rectovaginal: Confirms               Anus:  normal sphincter tone, no lesions  Chaperone  present: no  A:  Well Woman with normal exam  S/P TVH 1973 secondary to DUB. S/P Gastric Bypass 01/2004 History of MI 12/05 with CAD remains on Plavix History of atrophic vaginitis  History of Vit D deficiency  Recent hospital for HA following a steroid injection  to cervical neck  HTN    P:   Reviewed health and wellness pertinent to exam  Pap smear as above  Mammogram is due 12/2015 if this last one was normal - ROI is signed  Refill on estrogen cream - only using pea size amount at the urethra (pt. Is aware if Plavix is DC that she has to stop using vaginal estrogen cream)  Counseled with risk of CVA, DVT, cancer, etc.  Counseled on breast self exam, mammography screening, adequate intake of calcium and vitamin D, diet and exercise, Kegel's exercises return annually or prn  An After Visit Summary was printed and given to the patient.

## 2015-08-27 NOTE — Progress Notes (Signed)
Encounter reviewed by Dr. Jamie Belger Amundson C. Silva.  

## 2015-08-27 NOTE — Patient Instructions (Addendum)

## 2015-09-21 DIAGNOSIS — D0462 Carcinoma in situ of skin of left upper limb, including shoulder: Secondary | ICD-10-CM | POA: Diagnosis not present

## 2015-09-21 DIAGNOSIS — C4492 Squamous cell carcinoma of skin, unspecified: Secondary | ICD-10-CM

## 2015-09-21 DIAGNOSIS — D485 Neoplasm of uncertain behavior of skin: Secondary | ICD-10-CM | POA: Diagnosis not present

## 2015-09-21 DIAGNOSIS — L570 Actinic keratosis: Secondary | ICD-10-CM | POA: Diagnosis not present

## 2015-09-21 HISTORY — DX: Squamous cell carcinoma of skin, unspecified: C44.92

## 2015-09-29 DIAGNOSIS — I2510 Atherosclerotic heart disease of native coronary artery without angina pectoris: Secondary | ICD-10-CM | POA: Diagnosis not present

## 2015-09-29 DIAGNOSIS — M859 Disorder of bone density and structure, unspecified: Secondary | ICD-10-CM | POA: Diagnosis not present

## 2015-09-29 DIAGNOSIS — N390 Urinary tract infection, site not specified: Secondary | ICD-10-CM | POA: Diagnosis not present

## 2015-09-29 DIAGNOSIS — I10 Essential (primary) hypertension: Secondary | ICD-10-CM | POA: Diagnosis not present

## 2015-09-29 DIAGNOSIS — R8299 Other abnormal findings in urine: Secondary | ICD-10-CM | POA: Diagnosis not present

## 2015-09-29 DIAGNOSIS — E785 Hyperlipidemia, unspecified: Secondary | ICD-10-CM | POA: Diagnosis not present

## 2015-10-06 DIAGNOSIS — G4700 Insomnia, unspecified: Secondary | ICD-10-CM | POA: Diagnosis not present

## 2015-10-06 DIAGNOSIS — F419 Anxiety disorder, unspecified: Secondary | ICD-10-CM | POA: Diagnosis not present

## 2015-10-06 DIAGNOSIS — Z1212 Encounter for screening for malignant neoplasm of rectum: Secondary | ICD-10-CM | POA: Diagnosis not present

## 2015-10-06 DIAGNOSIS — M546 Pain in thoracic spine: Secondary | ICD-10-CM | POA: Diagnosis not present

## 2015-10-06 DIAGNOSIS — I2510 Atherosclerotic heart disease of native coronary artery without angina pectoris: Secondary | ICD-10-CM | POA: Diagnosis not present

## 2015-10-06 DIAGNOSIS — Z6825 Body mass index (BMI) 25.0-25.9, adult: Secondary | ICD-10-CM | POA: Diagnosis not present

## 2015-10-06 DIAGNOSIS — F329 Major depressive disorder, single episode, unspecified: Secondary | ICD-10-CM | POA: Diagnosis not present

## 2015-10-06 DIAGNOSIS — M5380 Other specified dorsopathies, site unspecified: Secondary | ICD-10-CM | POA: Diagnosis not present

## 2015-10-06 DIAGNOSIS — R945 Abnormal results of liver function studies: Secondary | ICD-10-CM | POA: Diagnosis not present

## 2015-10-06 DIAGNOSIS — R112 Nausea with vomiting, unspecified: Secondary | ICD-10-CM | POA: Diagnosis not present

## 2015-10-06 DIAGNOSIS — Z1389 Encounter for screening for other disorder: Secondary | ICD-10-CM | POA: Diagnosis not present

## 2015-10-06 DIAGNOSIS — Z0000 Encounter for general adult medical examination without abnormal findings: Secondary | ICD-10-CM | POA: Diagnosis not present

## 2015-10-14 DIAGNOSIS — D0462 Carcinoma in situ of skin of left upper limb, including shoulder: Secondary | ICD-10-CM | POA: Diagnosis not present

## 2015-10-20 ENCOUNTER — Encounter: Payer: Self-pay | Admitting: Cardiovascular Disease

## 2015-12-03 DIAGNOSIS — M546 Pain in thoracic spine: Secondary | ICD-10-CM | POA: Diagnosis not present

## 2015-12-03 DIAGNOSIS — M150 Primary generalized (osteo)arthritis: Secondary | ICD-10-CM | POA: Diagnosis not present

## 2016-01-27 DIAGNOSIS — A09 Infectious gastroenteritis and colitis, unspecified: Secondary | ICD-10-CM | POA: Diagnosis not present

## 2016-01-27 DIAGNOSIS — K219 Gastro-esophageal reflux disease without esophagitis: Secondary | ICD-10-CM | POA: Diagnosis not present

## 2016-02-01 DIAGNOSIS — K219 Gastro-esophageal reflux disease without esophagitis: Secondary | ICD-10-CM | POA: Diagnosis not present

## 2016-02-11 DIAGNOSIS — Z803 Family history of malignant neoplasm of breast: Secondary | ICD-10-CM | POA: Diagnosis not present

## 2016-02-11 DIAGNOSIS — Z1231 Encounter for screening mammogram for malignant neoplasm of breast: Secondary | ICD-10-CM | POA: Diagnosis not present

## 2016-02-22 DIAGNOSIS — K219 Gastro-esophageal reflux disease without esophagitis: Secondary | ICD-10-CM | POA: Diagnosis not present

## 2016-02-22 DIAGNOSIS — R197 Diarrhea, unspecified: Secondary | ICD-10-CM | POA: Diagnosis not present

## 2016-04-30 DIAGNOSIS — N390 Urinary tract infection, site not specified: Secondary | ICD-10-CM | POA: Diagnosis not present

## 2016-04-30 DIAGNOSIS — I10 Essential (primary) hypertension: Secondary | ICD-10-CM | POA: Diagnosis not present

## 2016-04-30 DIAGNOSIS — R05 Cough: Secondary | ICD-10-CM | POA: Diagnosis not present

## 2016-04-30 DIAGNOSIS — G454 Transient global amnesia: Secondary | ICD-10-CM | POA: Diagnosis not present

## 2016-04-30 DIAGNOSIS — G319 Degenerative disease of nervous system, unspecified: Secondary | ICD-10-CM | POA: Diagnosis not present

## 2016-04-30 DIAGNOSIS — J40 Bronchitis, not specified as acute or chronic: Secondary | ICD-10-CM | POA: Diagnosis not present

## 2016-04-30 DIAGNOSIS — Z79899 Other long term (current) drug therapy: Secondary | ICD-10-CM | POA: Diagnosis not present

## 2016-04-30 DIAGNOSIS — M797 Fibromyalgia: Secondary | ICD-10-CM | POA: Diagnosis not present

## 2016-04-30 DIAGNOSIS — R410 Disorientation, unspecified: Secondary | ICD-10-CM | POA: Diagnosis not present

## 2016-04-30 DIAGNOSIS — R0602 Shortness of breath: Secondary | ICD-10-CM | POA: Diagnosis not present

## 2016-04-30 DIAGNOSIS — I639 Cerebral infarction, unspecified: Secondary | ICD-10-CM | POA: Diagnosis not present

## 2016-04-30 DIAGNOSIS — Z7902 Long term (current) use of antithrombotics/antiplatelets: Secondary | ICD-10-CM | POA: Diagnosis not present

## 2016-04-30 DIAGNOSIS — I252 Old myocardial infarction: Secondary | ICD-10-CM | POA: Diagnosis not present

## 2016-04-30 DIAGNOSIS — M1990 Unspecified osteoarthritis, unspecified site: Secondary | ICD-10-CM | POA: Diagnosis not present

## 2016-04-30 DIAGNOSIS — R079 Chest pain, unspecified: Secondary | ICD-10-CM | POA: Diagnosis not present

## 2016-04-30 DIAGNOSIS — Z87891 Personal history of nicotine dependence: Secondary | ICD-10-CM | POA: Diagnosis not present

## 2016-04-30 DIAGNOSIS — Z888 Allergy status to other drugs, medicaments and biological substances status: Secondary | ICD-10-CM | POA: Diagnosis not present

## 2016-04-30 DIAGNOSIS — Z951 Presence of aortocoronary bypass graft: Secondary | ICD-10-CM | POA: Diagnosis not present

## 2016-05-01 DIAGNOSIS — N390 Urinary tract infection, site not specified: Secondary | ICD-10-CM | POA: Insufficient documentation

## 2016-05-01 DIAGNOSIS — J069 Acute upper respiratory infection, unspecified: Secondary | ICD-10-CM | POA: Insufficient documentation

## 2016-05-01 DIAGNOSIS — N3001 Acute cystitis with hematuria: Secondary | ICD-10-CM | POA: Diagnosis not present

## 2016-05-01 DIAGNOSIS — I6521 Occlusion and stenosis of right carotid artery: Secondary | ICD-10-CM | POA: Diagnosis not present

## 2016-05-01 DIAGNOSIS — I083 Combined rheumatic disorders of mitral, aortic and tricuspid valves: Secondary | ICD-10-CM | POA: Diagnosis not present

## 2016-05-01 DIAGNOSIS — G454 Transient global amnesia: Secondary | ICD-10-CM | POA: Diagnosis not present

## 2016-05-01 DIAGNOSIS — I519 Heart disease, unspecified: Secondary | ICD-10-CM | POA: Diagnosis not present

## 2016-05-22 ENCOUNTER — Other Ambulatory Visit: Payer: Self-pay | Admitting: Cardiovascular Disease

## 2016-05-22 ENCOUNTER — Other Ambulatory Visit: Payer: Self-pay | Admitting: Internal Medicine

## 2016-05-24 DIAGNOSIS — J40 Bronchitis, not specified as acute or chronic: Secondary | ICD-10-CM | POA: Diagnosis not present

## 2016-05-24 DIAGNOSIS — F329 Major depressive disorder, single episode, unspecified: Secondary | ICD-10-CM | POA: Diagnosis not present

## 2016-05-24 DIAGNOSIS — Z6824 Body mass index (BMI) 24.0-24.9, adult: Secondary | ICD-10-CM | POA: Diagnosis not present

## 2016-05-24 DIAGNOSIS — G458 Other transient cerebral ischemic attacks and related syndromes: Secondary | ICD-10-CM | POA: Diagnosis not present

## 2016-06-13 DIAGNOSIS — L570 Actinic keratosis: Secondary | ICD-10-CM | POA: Diagnosis not present

## 2016-06-13 DIAGNOSIS — D239 Other benign neoplasm of skin, unspecified: Secondary | ICD-10-CM | POA: Diagnosis not present

## 2016-06-22 DIAGNOSIS — H04123 Dry eye syndrome of bilateral lacrimal glands: Secondary | ICD-10-CM | POA: Diagnosis not present

## 2016-06-22 DIAGNOSIS — H524 Presbyopia: Secondary | ICD-10-CM | POA: Diagnosis not present

## 2016-07-26 DIAGNOSIS — D485 Neoplasm of uncertain behavior of skin: Secondary | ICD-10-CM | POA: Diagnosis not present

## 2016-07-26 DIAGNOSIS — L821 Other seborrheic keratosis: Secondary | ICD-10-CM | POA: Diagnosis not present

## 2016-07-26 DIAGNOSIS — D0439 Carcinoma in situ of skin of other parts of face: Secondary | ICD-10-CM | POA: Diagnosis not present

## 2016-07-26 DIAGNOSIS — L570 Actinic keratosis: Secondary | ICD-10-CM | POA: Diagnosis not present

## 2016-07-26 DIAGNOSIS — L820 Inflamed seborrheic keratosis: Secondary | ICD-10-CM | POA: Diagnosis not present

## 2016-08-24 DIAGNOSIS — D0439 Carcinoma in situ of skin of other parts of face: Secondary | ICD-10-CM | POA: Diagnosis not present

## 2016-09-01 ENCOUNTER — Ambulatory Visit (INDEPENDENT_AMBULATORY_CARE_PROVIDER_SITE_OTHER): Payer: Medicare Other | Admitting: Nurse Practitioner

## 2016-09-01 ENCOUNTER — Encounter: Payer: Self-pay | Admitting: Nurse Practitioner

## 2016-09-01 VITALS — BP 98/64 | HR 64 | Ht 61.75 in | Wt 136.0 lb

## 2016-09-01 DIAGNOSIS — Z01419 Encounter for gynecological examination (general) (routine) without abnormal findings: Secondary | ICD-10-CM | POA: Diagnosis not present

## 2016-09-01 DIAGNOSIS — I10 Essential (primary) hypertension: Secondary | ICD-10-CM | POA: Diagnosis not present

## 2016-09-01 DIAGNOSIS — Z124 Encounter for screening for malignant neoplasm of cervix: Secondary | ICD-10-CM

## 2016-09-01 DIAGNOSIS — N952 Postmenopausal atrophic vaginitis: Secondary | ICD-10-CM | POA: Diagnosis not present

## 2016-09-01 NOTE — Progress Notes (Signed)
Patient ID: Kimberly Stone, female   DOB: March 08, 1946, 70 y.o.   MRN: [id number]  70 y.o. G2P2002 Married  Caucasian Fe here for annual exam. In May 14/17 went to [place] and had a bad cold.  Then went to get evaluated for cough but diagnosis of bronchitis or pneumonia.  But soon after arrival had trans global amnesia that lasted 24 hours.  Further evaluation for CVA or other neurological disorder was negative. Herbal med: C buck horn is now used for vaginal dryness.  It is an oral tablet taken daily.  Patient's last menstrual period was 03/18/1972 (approximate).          Sexually active: No.  The current method of family planning is post menopausal status.    Exercising: Yes.    works on the farm and always Smoker:  no  Health Maintenance: Pap: 05/2002, Negative MMG: 02/11/16, 3D, Bi-Rads 1: Negative  Colonoscopy: 2014 - Normal, repeat in 10 years BMD:2016 with PCP, osteopenia of the spine per patient TDaP: 2010 Shingles Vaccine: age 63 Pneumonia: completed with Dr. Joylene Draft Hep C:  discuss today Labs: Dr. Joylene Draft takes care of all labs   reports that she quit smoking about 19 years ago. Her smoking use included Cigarettes. She has a 1.25 pack-year smoking history. She has never used smokeless tobacco. She reports that she drinks alcohol. She reports that she does not use drugs.  Past Medical History:  Diagnosis Date  . Acute MI ([place]) 11/2004   NONSTEMI 2005 NOINTERVENTION PER NOTE   . Anemia    from bypass- iron and B12  . Anxiety   . Arthritis   . Coronary artery disease   . Depression   . Fibromyalgia   . GERD (gastroesophageal reflux disease)   . GIB (gastrointestinal bleeding)    while on celecoxib post bypass  . Hyperlipidemia   . Hyperlipidemia LDL goal < 130 01/23/2014  . Hypertension   . Obesity    hx of bipass, now resolved.  borderline diabetes also resolved.  Marland Kitchen Post-menopausal   . Scoliosis   . Statin intolerance 01/23/2014    Past Surgical History:  Procedure  Laterality Date  . CARDIAC CATHETERIZATION     2005 Severely disease small optional diagonal vessel responsible far wall motion abnormalities wise to small and diffusely diseased for consideration of interventio.  Marland Kitchen CATARACT EXTRACTION    . COLONOSCOPY    . GASTRIC BYPASS  01/2004  . KNEE ARTHROSCOPY Right   . LAPAROSCOPIC CHOLECYSTECTOMY  1993  . TOTAL VAGINAL HYSTERECTOMY  1973   secondary to DUB - Dr. Connye Burkitt and Dr. Tamala Julian    Current Outpatient Prescriptions  Medication Sig Dispense Refill  . acetaminophen (TYLENOL) 650 MG CR tablet Take 650 mg by mouth as needed for pain.     Marland Kitchen amLODipine (NORVASC) 5 MG tablet Take 5 mg by mouth daily with breakfast.    . clopidogrel (PLAVIX) 75 MG tablet TAKE 1 TABLET BY MOUTH EVERY DAY 90 tablet 2  . diclofenac sodium (VOLTAREN) 1 % GEL Apply 1 application topically 4 (four) times daily as needed. FOR BACK PAIN    . enalapril (VASOTEC) 5 MG tablet Take 5 mg by mouth daily with breakfast.    . estradiol (ESTRACE) 0.1 MG/GM vaginal cream Use 1/2 g vaginally twice weekly 42.5 g 3  . gabapentin (NEURONTIN) 300 MG capsule Take 300 mg by mouth at bedtime.    . metoCLOPramide (REGLAN) 5 MG tablet Take 5 mg by mouth 3 (three)  times daily before meals.    . Multiple Vitamin (MULTIVITAMIN WITH MINERALS) TABS Take 2 tablets by mouth daily.    . nitroGLYCERIN (NITROSTAT) 0.4 MG SL tablet Place 1 tablet (0.4 mg total) under the tongue every 5 (five) minutes as needed for chest pain. 30 tablet 0  . oxyCODONE (OXY IR/ROXICODONE) 5 MG immediate release tablet Take 1-2 tablets (5-10 mg total) by mouth every 6 (six) hours as needed for breakthrough pain. 15 tablet 0  . pantoprazole (PROTONIX) 40 MG tablet Take 40 mg by mouth daily with breakfast.    . PRESCRIPTION MEDICATION Inject 1 application into the muscle every 14 (fourteen) days. "REPARTHA" INJECTION    . Vitamin D, Ergocalciferol, (DRISDOL) 50000 UNITS CAPS Take 50,000 Units by mouth every Monday, Wednesday, and  Friday.      No current facility-administered medications for this visit.     Family History  Problem Relation Age of Onset  . Heart failure Mother   . Liver cancer Father   . Lung cancer Brother 34  . Cancer Brother   . Heart failure Maternal Grandmother   . Aneurysm Maternal Grandmother     ruptured abdominal aortic aneurysm    ROS:  Pertinent items are noted in HPI.  Otherwise, a comprehensive ROS was negative.  Exam:   LMP 03/18/1972 (Approximate)    Ht Readings from Last 3 Encounters:  08/27/15 5' 1.75" (1.568 m)  06/09/15 5\' 2"  (1.575 m)  02/03/15 5\' 2"  (1.575 m)    General appearance: alert, cooperative and appears stated age Head: Normocephalic, without obvious abnormality, atraumatic Neck: no adenopathy, supple, symmetrical, trachea midline and thyroid normal to inspection and palpation Lungs: clear to auscultation bilaterally Breasts: normal appearance, no masses or tenderness Heart: regular rate and rhythm Abdomen: soft, non-tender; no masses,  no organomegaly Extremities: extremities normal, atraumatic, no cyanosis or edema Skin: Skin color, texture, turgor normal. No rashes or lesions Lymph nodes: Cervical, supraclavicular, and axillary nodes normal. No abnormal inguinal nodes palpated Neurologic: Grossly normal   Pelvic: External genitalia:  no lesions              Urethra:  normal appearing urethra with no masses, tenderness or lesions              Bartholin's and Skene's: normal                 Vagina: normal appearing vagina with normal color and discharge, no lesions              Cervix: absent              Pap taken: No. Bimanual Exam:  Uterus:  uterus absent              Adnexa: no mass, fullness, tenderness               Rectovaginal: Confirms               Anus:  normal sphincter tone, no lesions  Chaperone present: no  A:  Well Woman with normal exam  S/P TVH 1973 secondary to DUB.  S/P Gastric Bypass 01/2004  History of MI 12/05 with CAD  remains on Plavix  History of atrophic vaginitis - currently off vaginal estrogen  History of Vit D deficiency  5/17 hospital for URI and had an episode of Global Amnesia that lasted 24 hours  HTN   P:   Reviewed health and wellness pertinent to exam  Pap smear as above  Mammogram is due 01/2017  Will get ROI of BMD results  Does not need or want a refill on vaginal estrogen cream since herbal product is helping  Counseled on breast self exam, mammography screening, adequate intake of calcium and vitamin D, diet and exercise, Kegel's exercises return annually or prn  An After Visit Summary was printed and given to the patient.

## 2016-09-01 NOTE — Patient Instructions (Addendum)

## 2016-09-03 NOTE — Progress Notes (Signed)
Encounter reviewed by Dr. Aundria Rud. I support staying off estrogen.

## 2016-10-18 DIAGNOSIS — I10 Essential (primary) hypertension: Secondary | ICD-10-CM | POA: Diagnosis not present

## 2016-10-18 DIAGNOSIS — E784 Other hyperlipidemia: Secondary | ICD-10-CM | POA: Diagnosis not present

## 2016-10-18 DIAGNOSIS — M859 Disorder of bone density and structure, unspecified: Secondary | ICD-10-CM | POA: Diagnosis not present

## 2016-10-25 DIAGNOSIS — R945 Abnormal results of liver function studies: Secondary | ICD-10-CM | POA: Diagnosis not present

## 2016-10-25 DIAGNOSIS — M791 Myalgia: Secondary | ICD-10-CM | POA: Diagnosis not present

## 2016-10-25 DIAGNOSIS — G458 Other transient cerebral ischemic attacks and related syndromes: Secondary | ICD-10-CM | POA: Diagnosis not present

## 2016-10-25 DIAGNOSIS — E784 Other hyperlipidemia: Secondary | ICD-10-CM | POA: Diagnosis not present

## 2016-10-25 DIAGNOSIS — D6489 Other specified anemias: Secondary | ICD-10-CM | POA: Diagnosis not present

## 2016-10-25 DIAGNOSIS — I2510 Atherosclerotic heart disease of native coronary artery without angina pectoris: Secondary | ICD-10-CM | POA: Diagnosis not present

## 2016-10-25 DIAGNOSIS — M546 Pain in thoracic spine: Secondary | ICD-10-CM | POA: Diagnosis not present

## 2016-10-25 DIAGNOSIS — I10 Essential (primary) hypertension: Secondary | ICD-10-CM | POA: Diagnosis not present

## 2016-10-25 DIAGNOSIS — Z6825 Body mass index (BMI) 25.0-25.9, adult: Secondary | ICD-10-CM | POA: Diagnosis not present

## 2016-10-25 DIAGNOSIS — Z0000 Encounter for general adult medical examination without abnormal findings: Secondary | ICD-10-CM | POA: Diagnosis not present

## 2016-10-25 DIAGNOSIS — Z1389 Encounter for screening for other disorder: Secondary | ICD-10-CM | POA: Diagnosis not present

## 2016-10-25 DIAGNOSIS — F418 Other specified anxiety disorders: Secondary | ICD-10-CM | POA: Diagnosis not present

## 2016-10-26 DIAGNOSIS — Z1212 Encounter for screening for malignant neoplasm of rectum: Secondary | ICD-10-CM | POA: Diagnosis not present

## 2017-01-17 DIAGNOSIS — M9901 Segmental and somatic dysfunction of cervical region: Secondary | ICD-10-CM | POA: Diagnosis not present

## 2017-01-17 DIAGNOSIS — M5031 Other cervical disc degeneration,  high cervical region: Secondary | ICD-10-CM | POA: Diagnosis not present

## 2017-01-18 DIAGNOSIS — M9901 Segmental and somatic dysfunction of cervical region: Secondary | ICD-10-CM | POA: Diagnosis not present

## 2017-01-18 DIAGNOSIS — M5031 Other cervical disc degeneration,  high cervical region: Secondary | ICD-10-CM | POA: Diagnosis not present

## 2017-01-23 DIAGNOSIS — M5031 Other cervical disc degeneration,  high cervical region: Secondary | ICD-10-CM | POA: Diagnosis not present

## 2017-01-23 DIAGNOSIS — M9901 Segmental and somatic dysfunction of cervical region: Secondary | ICD-10-CM | POA: Diagnosis not present

## 2017-01-25 DIAGNOSIS — M5031 Other cervical disc degeneration,  high cervical region: Secondary | ICD-10-CM | POA: Diagnosis not present

## 2017-01-25 DIAGNOSIS — M9901 Segmental and somatic dysfunction of cervical region: Secondary | ICD-10-CM | POA: Diagnosis not present

## 2017-01-29 DIAGNOSIS — M9901 Segmental and somatic dysfunction of cervical region: Secondary | ICD-10-CM | POA: Diagnosis not present

## 2017-01-29 DIAGNOSIS — M5031 Other cervical disc degeneration,  high cervical region: Secondary | ICD-10-CM | POA: Diagnosis not present

## 2017-02-01 DIAGNOSIS — M9901 Segmental and somatic dysfunction of cervical region: Secondary | ICD-10-CM | POA: Diagnosis not present

## 2017-02-01 DIAGNOSIS — M5031 Other cervical disc degeneration,  high cervical region: Secondary | ICD-10-CM | POA: Diagnosis not present

## 2017-02-13 DIAGNOSIS — M5031 Other cervical disc degeneration,  high cervical region: Secondary | ICD-10-CM | POA: Diagnosis not present

## 2017-02-13 DIAGNOSIS — Z1231 Encounter for screening mammogram for malignant neoplasm of breast: Secondary | ICD-10-CM | POA: Diagnosis not present

## 2017-02-13 DIAGNOSIS — M9901 Segmental and somatic dysfunction of cervical region: Secondary | ICD-10-CM | POA: Diagnosis not present

## 2017-02-13 DIAGNOSIS — Z803 Family history of malignant neoplasm of breast: Secondary | ICD-10-CM | POA: Diagnosis not present

## 2017-02-14 DIAGNOSIS — M5031 Other cervical disc degeneration,  high cervical region: Secondary | ICD-10-CM | POA: Diagnosis not present

## 2017-02-14 DIAGNOSIS — M9901 Segmental and somatic dysfunction of cervical region: Secondary | ICD-10-CM | POA: Diagnosis not present

## 2017-02-15 DIAGNOSIS — M9901 Segmental and somatic dysfunction of cervical region: Secondary | ICD-10-CM | POA: Diagnosis not present

## 2017-02-15 DIAGNOSIS — M5031 Other cervical disc degeneration,  high cervical region: Secondary | ICD-10-CM | POA: Diagnosis not present

## 2017-02-19 DIAGNOSIS — M5031 Other cervical disc degeneration,  high cervical region: Secondary | ICD-10-CM | POA: Diagnosis not present

## 2017-02-19 DIAGNOSIS — M9901 Segmental and somatic dysfunction of cervical region: Secondary | ICD-10-CM | POA: Diagnosis not present

## 2017-02-22 DIAGNOSIS — M9901 Segmental and somatic dysfunction of cervical region: Secondary | ICD-10-CM | POA: Diagnosis not present

## 2017-02-22 DIAGNOSIS — M5031 Other cervical disc degeneration,  high cervical region: Secondary | ICD-10-CM | POA: Diagnosis not present

## 2017-02-27 DIAGNOSIS — M9901 Segmental and somatic dysfunction of cervical region: Secondary | ICD-10-CM | POA: Diagnosis not present

## 2017-02-27 DIAGNOSIS — M5031 Other cervical disc degeneration,  high cervical region: Secondary | ICD-10-CM | POA: Diagnosis not present

## 2017-03-08 DIAGNOSIS — M9901 Segmental and somatic dysfunction of cervical region: Secondary | ICD-10-CM | POA: Diagnosis not present

## 2017-03-08 DIAGNOSIS — M5031 Other cervical disc degeneration,  high cervical region: Secondary | ICD-10-CM | POA: Diagnosis not present

## 2017-03-14 DIAGNOSIS — M5031 Other cervical disc degeneration,  high cervical region: Secondary | ICD-10-CM | POA: Diagnosis not present

## 2017-03-14 DIAGNOSIS — M9901 Segmental and somatic dysfunction of cervical region: Secondary | ICD-10-CM | POA: Diagnosis not present

## 2017-03-21 DIAGNOSIS — K219 Gastro-esophageal reflux disease without esophagitis: Secondary | ICD-10-CM | POA: Diagnosis not present

## 2017-03-21 DIAGNOSIS — R197 Diarrhea, unspecified: Secondary | ICD-10-CM | POA: Diagnosis not present

## 2017-03-27 DIAGNOSIS — M5031 Other cervical disc degeneration,  high cervical region: Secondary | ICD-10-CM | POA: Diagnosis not present

## 2017-03-27 DIAGNOSIS — M9901 Segmental and somatic dysfunction of cervical region: Secondary | ICD-10-CM | POA: Diagnosis not present

## 2017-04-04 ENCOUNTER — Other Ambulatory Visit: Payer: Self-pay | Admitting: Cardiovascular Disease

## 2017-04-04 DIAGNOSIS — M5031 Other cervical disc degeneration,  high cervical region: Secondary | ICD-10-CM | POA: Diagnosis not present

## 2017-04-04 DIAGNOSIS — M9901 Segmental and somatic dysfunction of cervical region: Secondary | ICD-10-CM | POA: Diagnosis not present

## 2017-04-18 DIAGNOSIS — M9901 Segmental and somatic dysfunction of cervical region: Secondary | ICD-10-CM | POA: Diagnosis not present

## 2017-04-18 DIAGNOSIS — M5031 Other cervical disc degeneration,  high cervical region: Secondary | ICD-10-CM | POA: Diagnosis not present

## 2017-05-05 ENCOUNTER — Other Ambulatory Visit: Payer: Self-pay | Admitting: Cardiovascular Disease

## 2017-05-07 NOTE — Telephone Encounter (Signed)
Rx request sent to pharmacy.  

## 2017-06-20 ENCOUNTER — Other Ambulatory Visit: Payer: Self-pay | Admitting: Cardiovascular Disease

## 2017-07-05 DIAGNOSIS — H04123 Dry eye syndrome of bilateral lacrimal glands: Secondary | ICD-10-CM | POA: Diagnosis not present

## 2017-07-23 ENCOUNTER — Other Ambulatory Visit: Payer: Self-pay | Admitting: Cardiovascular Disease

## 2017-08-25 ENCOUNTER — Other Ambulatory Visit: Payer: Self-pay | Admitting: Cardiovascular Disease

## 2017-09-05 ENCOUNTER — Ambulatory Visit: Payer: Medicare Other | Admitting: Nurse Practitioner

## 2017-09-06 ENCOUNTER — Encounter: Payer: Self-pay | Admitting: Certified Nurse Midwife

## 2017-09-06 ENCOUNTER — Ambulatory Visit (INDEPENDENT_AMBULATORY_CARE_PROVIDER_SITE_OTHER): Payer: Medicare Other | Admitting: Certified Nurse Midwife

## 2017-09-06 VITALS — BP 110/70 | HR 60 | Resp 16 | Ht 61.75 in | Wt 131.0 lb

## 2017-09-06 DIAGNOSIS — Z124 Encounter for screening for malignant neoplasm of cervix: Secondary | ICD-10-CM

## 2017-09-06 DIAGNOSIS — N951 Menopausal and female climacteric states: Secondary | ICD-10-CM

## 2017-09-06 DIAGNOSIS — Z01419 Encounter for gynecological examination (general) (routine) without abnormal findings: Secondary | ICD-10-CM | POA: Diagnosis not present

## 2017-09-06 NOTE — Patient Instructions (Signed)

## 2017-09-06 NOTE — Progress Notes (Signed)
71 y.o. G2 p2002 and one adopted daughter white married female here for aex. Has also raised step grandchildren. Menopausal no HRT, denies vaginal bleeding. Sees Dr. Annabelle Harman for osteoarthritis  Management. 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 No weight change,had gastric bypass years ago and stays stable weight. Has some hearing loss, uses hearing aids as needed. Taking injectable medication for cholesterol. Sees Dr. Letta Median for aex/labs and medication management of cholesterol, vitamin d, anxiety. All stable per patient. No health issues today. Planning cruise to [place]!  Patient's last menstrual period was 03/18/1972 (approximate).          Sexually active: No.  The current method of family planning is status post hysterectomy.    Exercising: Yes.    work on farm Smoker:  no  Health Maintenance: Pap:  6/03 neg History of Abnormal Pap: no MMG:  2018 per patient neg, pt to sign release Self Breast exams: yes Colonoscopy:  2014 f/u 86yrs BMD:   2016 osteopenia TDaP:  2010 Shingles: done age 89 Pneumonia: had done Hep C and HIV: not done Labs: pcp   reports that she quit smoking about 20 years ago. Her smoking use included Cigarettes. She has a 1.25 pack-year smoking history. She has never used smokeless tobacco. She reports that she drinks alcohol. She reports that she does not use drugs.  Past Medical History:  Diagnosis Date  . Acute MI ([place]) 11/2004   NONSTEMI 2005 NOINTERVENTION PER NOTE   . Anemia    from bypass- iron and B12  . Anxiety   . Arthritis   . Coronary artery disease   . Depression   . Fibromyalgia   . GERD (gastroesophageal reflux disease)   . GIB (gastrointestinal bleeding)    while on celecoxib post bypass  . Hyperlipidemia   . Hyperlipidemia LDL goal < 130 01/23/2014  . Hypertension   . Obesity    hx of bipass, now resolved.  borderline diabetes also resolved.  Marland Kitchen Post-menopausal   . Scoliosis   . Statin intolerance 01/23/2014    Past Surgical History:   Procedure Laterality Date  . CARDIAC CATHETERIZATION     2005 Severely disease small optional diagonal vessel responsible far wall motion abnormalities wise to small and diffusely diseased for consideration of interventio.  Marland Kitchen CATARACT EXTRACTION    . COLONOSCOPY    . GASTRIC BYPASS  01/2004  . KNEE ARTHROSCOPY Right   . LAPAROSCOPIC CHOLECYSTECTOMY  1993  . TOTAL VAGINAL HYSTERECTOMY  1973   secondary to DUB - Dr. Connye Burkitt and Dr. Tamala Julian    Current Outpatient Prescriptions  Medication Sig Dispense Refill  . acetaminophen (TYLENOL) 650 MG CR tablet Take 650 mg by mouth as needed for pain.     Marland Kitchen amLODipine (NORVASC) 5 MG tablet Take 5 mg by mouth daily with breakfast.    . clopidogrel (PLAVIX) 75 MG tablet TAKE 1 TABLET (75 MG TOTAL) BY MOUTH DAILY. PLEASE SCHEDULE APPOINTMENT FOR REFILLS. 7 tablet 0  . enalapril (VASOTEC) 5 MG tablet Take 5 mg by mouth daily with breakfast.    . escitalopram (LEXAPRO) 20 MG tablet Take 1 tablet by mouth daily.    . metoCLOPramide (REGLAN) 5 MG tablet Take 5 mg by mouth 3 (three) times daily before meals.    . Multiple Vitamin (MULTIVITAMIN WITH MINERALS) TABS Take 2 tablets by mouth daily.    . nitroGLYCERIN (NITROSTAT) 0.4 MG SL tablet Place 1 tablet (0.4 mg total) under the tongue every 5 (five) minutes as  needed for chest pain. 30 tablet 0  . pantoprazole (PROTONIX) 40 MG tablet Take 40 mg by mouth daily with breakfast.    . PRESCRIPTION MEDICATION Inject 1 application into the muscle. INJECTION every 21-30 days    . Vitamin D, Ergocalciferol, (DRISDOL) 50000 UNITS CAPS Take 50,000 Units by mouth every Monday, Wednesday, and Friday.      No current facility-administered medications for this visit.     Family History  Problem Relation Age of Onset  . Heart failure Mother   . Liver cancer Father   . Lung cancer Brother 78  . Cancer Brother   . Heart failure Maternal Grandmother   . Aneurysm Maternal Grandmother        ruptured abdominal aortic  aneurysm    ROS:  Pertinent items are noted in HPI.  Otherwise, a comprehensive ROS was negative.  Exam:   BP 110/70   Pulse 60   Resp 16   Ht 5' 1.75" (1.568 m)   Wt 131 lb (59.4 kg)   LMP 03/18/1972 (Approximate)   BMI 24.15 kg/m  Height: 5' 1.75" (156.8 cm) Ht Readings from Last 3 Encounters:  09/06/17 5' 1.75" (1.568 m)  09/01/16 5' 1.75" (1.568 m)  08/27/15 5' 1.75" (1.568 m)    General appearance: alert, cooperative and appears stated age Head: Normocephalic, without obvious abnormality, atraumatic Neck: no adenopathy, supple, symmetrical, trachea midline and thyroid normal to inspection and palpation Lungs: clear to auscultation bilaterally Breasts: normal appearance, no masses or tenderness, No nipple retraction or dimpling, No nipple discharge or bleeding, No axillary or supraclavicular adenopathy Heart: regular rate and rhythm Abdomen: soft, non-tender; no masses,  no organomegaly Extremities: extremities normal, atraumatic, no cyanosis or edema Skin: Skin color, texture, turgor normal. No rashes or lesions Lymph nodes: Cervical, supraclavicular, and axillary nodes normal. No abnormal inguinal nodes palpated Neurologic: Grossly normal   Pelvic: External genitalia:  no lesions              Urethra:  normal appearing urethra with no masses, tenderness or lesions              Bartholin's and Skene's: normal                 Vagina: normal appearing vagina with normal color and discharge, no lesions              Cervix: absent              Pap taken: No. Bimanual Exam:  Uterus:  uterus absent              Adnexa: normal adnexa and no mass, fullness, tenderness               Rectovaginal: Confirms               Anus:  normal sphincter tone, no lesions  Chaperone present: yes  A:  Well Woman with normal exam  Menopausal no HRT s/p TVH for bleeding  Osteoarthritis/cholesterol/vitamin d deficiency/anxiety with MD management  BMD scheduled  P:   Reviewed health and  wellness pertinent to exam  Discussed vaginal dryness if increases and OTC coconut oil use with instructions  Continue MD management as indicated.  Pap smear: no   counseled on breast self exam, mammography screening, feminine hygiene, adequate intake of calcium and vitamin D, diet and exercise, Kegel's exercises  return annually or prn  An After Visit Summary was printed and given to the patient.

## 2017-10-18 ENCOUNTER — Other Ambulatory Visit: Payer: Self-pay | Admitting: Cardiovascular Disease

## 2017-12-26 DIAGNOSIS — I10 Essential (primary) hypertension: Secondary | ICD-10-CM | POA: Diagnosis not present

## 2017-12-26 DIAGNOSIS — D649 Anemia, unspecified: Secondary | ICD-10-CM | POA: Diagnosis not present

## 2017-12-26 DIAGNOSIS — M859 Disorder of bone density and structure, unspecified: Secondary | ICD-10-CM | POA: Diagnosis not present

## 2017-12-26 DIAGNOSIS — E7849 Other hyperlipidemia: Secondary | ICD-10-CM | POA: Diagnosis not present

## 2017-12-26 DIAGNOSIS — R82998 Other abnormal findings in urine: Secondary | ICD-10-CM | POA: Diagnosis not present

## 2017-12-26 DIAGNOSIS — Z79899 Other long term (current) drug therapy: Secondary | ICD-10-CM | POA: Diagnosis not present

## 2018-01-02 DIAGNOSIS — R945 Abnormal results of liver function studies: Secondary | ICD-10-CM | POA: Diagnosis not present

## 2018-01-02 DIAGNOSIS — Z23 Encounter for immunization: Secondary | ICD-10-CM | POA: Diagnosis not present

## 2018-01-02 DIAGNOSIS — G4709 Other insomnia: Secondary | ICD-10-CM | POA: Diagnosis not present

## 2018-01-02 DIAGNOSIS — D6489 Other specified anemias: Secondary | ICD-10-CM | POA: Diagnosis not present

## 2018-01-02 DIAGNOSIS — G458 Other transient cerebral ischemic attacks and related syndromes: Secondary | ICD-10-CM | POA: Diagnosis not present

## 2018-01-02 DIAGNOSIS — R011 Cardiac murmur, unspecified: Secondary | ICD-10-CM | POA: Diagnosis not present

## 2018-01-02 DIAGNOSIS — F418 Other specified anxiety disorders: Secondary | ICD-10-CM | POA: Diagnosis not present

## 2018-01-02 DIAGNOSIS — Z6823 Body mass index (BMI) 23.0-23.9, adult: Secondary | ICD-10-CM | POA: Diagnosis not present

## 2018-01-02 DIAGNOSIS — I2510 Atherosclerotic heart disease of native coronary artery without angina pectoris: Secondary | ICD-10-CM | POA: Diagnosis not present

## 2018-01-02 DIAGNOSIS — Z1389 Encounter for screening for other disorder: Secondary | ICD-10-CM | POA: Diagnosis not present

## 2018-01-02 DIAGNOSIS — H6123 Impacted cerumen, bilateral: Secondary | ICD-10-CM | POA: Diagnosis not present

## 2018-01-02 DIAGNOSIS — M546 Pain in thoracic spine: Secondary | ICD-10-CM | POA: Diagnosis not present

## 2018-01-02 DIAGNOSIS — Z0000 Encounter for general adult medical examination without abnormal findings: Secondary | ICD-10-CM | POA: Diagnosis not present

## 2018-01-09 ENCOUNTER — Other Ambulatory Visit (HOSPITAL_COMMUNITY): Payer: Self-pay

## 2018-01-10 ENCOUNTER — Ambulatory Visit (HOSPITAL_COMMUNITY)
Admission: RE | Admit: 2018-01-10 | Discharge: 2018-01-10 | Disposition: A | Discharge status: Home / Self Care | Payer: Medicare Other | Source: Ambulatory Visit | Attending: Internal Medicine | Admitting: Internal Medicine

## 2018-01-10 DIAGNOSIS — D649 Anemia, unspecified: Secondary | ICD-10-CM | POA: Diagnosis not present

## 2018-01-10 MED ORDER — SODIUM CHLORIDE 0.9 % IV SOLN
510.0000 mg | INTRAVENOUS | Status: DC
  Administered 2018-01-10: 510 mg via INTRAVENOUS
  Filled 2018-01-10: qty 17

## 2018-01-10 NOTE — Discharge Instructions (Signed)

## 2018-01-11 DIAGNOSIS — Z1212 Encounter for screening for malignant neoplasm of rectum: Secondary | ICD-10-CM | POA: Diagnosis not present

## 2018-01-17 ENCOUNTER — Encounter (HOSPITAL_COMMUNITY)
Admission: RE | Admit: 2018-01-17 | Discharge: 2018-01-17 | Disposition: A | Discharge status: Home / Self Care | Payer: Medicare Other | Source: Ambulatory Visit | Attending: Internal Medicine | Admitting: Internal Medicine

## 2018-01-17 DIAGNOSIS — D649 Anemia, unspecified: Secondary | ICD-10-CM | POA: Insufficient documentation

## 2018-01-17 MED ORDER — SODIUM CHLORIDE 0.9 % IV SOLN
510.0000 mg | INTRAVENOUS | Status: AC
  Administered 2018-01-17: 510 mg via INTRAVENOUS
  Filled 2018-01-17: qty 17

## 2018-01-30 DIAGNOSIS — F331 Major depressive disorder, recurrent, moderate: Secondary | ICD-10-CM | POA: Diagnosis not present

## 2018-01-30 DIAGNOSIS — D649 Anemia, unspecified: Secondary | ICD-10-CM | POA: Diagnosis not present

## 2018-01-30 DIAGNOSIS — M859 Disorder of bone density and structure, unspecified: Secondary | ICD-10-CM | POA: Diagnosis not present

## 2018-01-30 DIAGNOSIS — F411 Generalized anxiety disorder: Secondary | ICD-10-CM | POA: Diagnosis not present

## 2018-02-12 DIAGNOSIS — Z1231 Encounter for screening mammogram for malignant neoplasm of breast: Secondary | ICD-10-CM | POA: Diagnosis not present

## 2018-02-26 DIAGNOSIS — F331 Major depressive disorder, recurrent, moderate: Secondary | ICD-10-CM | POA: Diagnosis not present

## 2018-02-26 DIAGNOSIS — F411 Generalized anxiety disorder: Secondary | ICD-10-CM | POA: Diagnosis not present

## 2018-02-27 DIAGNOSIS — K219 Gastro-esophageal reflux disease without esophagitis: Secondary | ICD-10-CM | POA: Diagnosis not present

## 2018-02-27 DIAGNOSIS — Z6822 Body mass index (BMI) 22.0-22.9, adult: Secondary | ICD-10-CM | POA: Diagnosis not present

## 2018-02-27 DIAGNOSIS — R05 Cough: Secondary | ICD-10-CM | POA: Diagnosis not present

## 2018-02-27 DIAGNOSIS — I10 Essential (primary) hypertension: Secondary | ICD-10-CM | POA: Diagnosis not present

## 2018-03-07 DIAGNOSIS — J019 Acute sinusitis, unspecified: Secondary | ICD-10-CM | POA: Diagnosis not present

## 2018-03-07 DIAGNOSIS — R05 Cough: Secondary | ICD-10-CM | POA: Diagnosis not present

## 2018-03-07 DIAGNOSIS — J209 Acute bronchitis, unspecified: Secondary | ICD-10-CM | POA: Diagnosis not present

## 2018-03-07 DIAGNOSIS — K219 Gastro-esophageal reflux disease without esophagitis: Secondary | ICD-10-CM | POA: Diagnosis not present

## 2018-03-07 DIAGNOSIS — I10 Essential (primary) hypertension: Secondary | ICD-10-CM | POA: Diagnosis not present

## 2018-03-07 DIAGNOSIS — Z6823 Body mass index (BMI) 23.0-23.9, adult: Secondary | ICD-10-CM | POA: Diagnosis not present

## 2018-03-14 DIAGNOSIS — D509 Iron deficiency anemia, unspecified: Secondary | ICD-10-CM | POA: Diagnosis not present

## 2018-03-14 DIAGNOSIS — T17908S Unspecified foreign body in respiratory tract, part unspecified causing other injury, sequela: Secondary | ICD-10-CM | POA: Diagnosis not present

## 2018-03-14 DIAGNOSIS — K219 Gastro-esophageal reflux disease without esophagitis: Secondary | ICD-10-CM | POA: Diagnosis not present

## 2018-03-14 DIAGNOSIS — Z9884 Bariatric surgery status: Secondary | ICD-10-CM | POA: Diagnosis not present

## 2018-03-14 DIAGNOSIS — K30 Functional dyspepsia: Secondary | ICD-10-CM | POA: Diagnosis not present

## 2018-04-30 DIAGNOSIS — F411 Generalized anxiety disorder: Secondary | ICD-10-CM | POA: Diagnosis not present

## 2018-04-30 DIAGNOSIS — F331 Major depressive disorder, recurrent, moderate: Secondary | ICD-10-CM | POA: Diagnosis not present

## 2018-05-01 DIAGNOSIS — Z6823 Body mass index (BMI) 23.0-23.9, adult: Secondary | ICD-10-CM | POA: Diagnosis not present

## 2018-05-01 DIAGNOSIS — F3289 Other specified depressive episodes: Secondary | ICD-10-CM | POA: Diagnosis not present

## 2018-05-01 DIAGNOSIS — D649 Anemia, unspecified: Secondary | ICD-10-CM | POA: Diagnosis not present

## 2018-05-01 DIAGNOSIS — I10 Essential (primary) hypertension: Secondary | ICD-10-CM | POA: Diagnosis not present

## 2018-05-01 DIAGNOSIS — I2510 Atherosclerotic heart disease of native coronary artery without angina pectoris: Secondary | ICD-10-CM | POA: Diagnosis not present

## 2018-05-01 DIAGNOSIS — M797 Fibromyalgia: Secondary | ICD-10-CM | POA: Diagnosis not present

## 2018-05-07 DIAGNOSIS — Z980 Intestinal bypass and anastomosis status: Secondary | ICD-10-CM | POA: Diagnosis not present

## 2018-05-07 DIAGNOSIS — Q396 Congenital diverticulum of esophagus: Secondary | ICD-10-CM | POA: Diagnosis not present

## 2018-05-07 DIAGNOSIS — D509 Iron deficiency anemia, unspecified: Secondary | ICD-10-CM | POA: Diagnosis not present

## 2018-05-07 DIAGNOSIS — K640 First degree hemorrhoids: Secondary | ICD-10-CM | POA: Diagnosis not present

## 2018-05-07 DIAGNOSIS — Z9884 Bariatric surgery status: Secondary | ICD-10-CM | POA: Diagnosis not present

## 2018-06-18 DIAGNOSIS — F331 Major depressive disorder, recurrent, moderate: Secondary | ICD-10-CM | POA: Diagnosis not present

## 2018-06-18 DIAGNOSIS — F411 Generalized anxiety disorder: Secondary | ICD-10-CM | POA: Diagnosis not present

## 2018-07-10 DIAGNOSIS — F411 Generalized anxiety disorder: Secondary | ICD-10-CM | POA: Diagnosis not present

## 2018-07-10 DIAGNOSIS — F331 Major depressive disorder, recurrent, moderate: Secondary | ICD-10-CM | POA: Diagnosis not present

## 2018-07-12 DIAGNOSIS — H52223 Regular astigmatism, bilateral: Secondary | ICD-10-CM | POA: Diagnosis not present

## 2018-07-12 DIAGNOSIS — H40013 Open angle with borderline findings, low risk, bilateral: Secondary | ICD-10-CM | POA: Diagnosis not present

## 2018-07-12 DIAGNOSIS — H524 Presbyopia: Secondary | ICD-10-CM | POA: Diagnosis not present

## 2018-07-12 DIAGNOSIS — H04123 Dry eye syndrome of bilateral lacrimal glands: Secondary | ICD-10-CM | POA: Diagnosis not present

## 2018-08-07 DIAGNOSIS — F3181 Bipolar II disorder: Secondary | ICD-10-CM | POA: Diagnosis not present

## 2018-08-22 DIAGNOSIS — D649 Anemia, unspecified: Secondary | ICD-10-CM | POA: Diagnosis not present

## 2018-08-22 DIAGNOSIS — Z79899 Other long term (current) drug therapy: Secondary | ICD-10-CM | POA: Diagnosis not present

## 2018-09-12 ENCOUNTER — Other Ambulatory Visit: Payer: Self-pay

## 2018-09-12 ENCOUNTER — Ambulatory Visit (INDEPENDENT_AMBULATORY_CARE_PROVIDER_SITE_OTHER): Payer: Medicare Other | Admitting: Certified Nurse Midwife

## 2018-09-12 ENCOUNTER — Encounter: Payer: Self-pay | Admitting: Certified Nurse Midwife

## 2018-09-12 VITALS — BP 110/68 | HR 68 | Resp 16 | Ht 61.5 in | Wt 134.0 lb

## 2018-09-12 DIAGNOSIS — Z01419 Encounter for gynecological examination (general) (routine) without abnormal findings: Secondary | ICD-10-CM | POA: Diagnosis not present

## 2018-09-12 DIAGNOSIS — N951 Menopausal and female climacteric states: Secondary | ICD-10-CM

## 2018-09-12 NOTE — Progress Notes (Addendum)
72 y.o. G43P2002 Married  Caucasian Fe here for annual exam.Menopausal denies vaginal bleeding or vaginal dryness. Not sexually active. Sees Psychiatrist for management of anxiety, depression with Cymbalta. Sees Dr. Joylene Draft for aex, labs cholesterol, hypertension and fibromyalgia.Follows up with cardiology if needed.Patient was on Lithium and weaned herself off due to unusual symptoms. Feels much better. Eating better now. No other health issues today.  Patient's last menstrual period was 03/18/1972 (approximate).          Sexually active: No.  The current method of family planning is status post hysterectomy.    Exercising: No.  exercise Smoker:  no  Review of Systems  Constitutional: Negative.   HENT: Positive for hearing loss.   Eyes: Negative.   Respiratory: Negative.   Cardiovascular: Negative.   Gastrointestinal: Negative.   Genitourinary: Negative.   Musculoskeletal:       Muscle/joint pain  Skin: Negative.   Neurological: Negative.   Endo/Heme/Allergies: Negative.   Psychiatric/Behavioral: Positive for depression.       Anxiety    Health Maintenance: Pap:  6/03 neg  History of Abnormal Pap: no MMG:  Waiting on fax for 2019 mmg Self Breast exams: occ Colonoscopy:  2014 f/u 37yrs BMD:   2016 osteopenia PCP manages TDaP:  UTD with pcp Shingles: 2019 Pneumonia: 2015 Hep C and HIV: not done Labs: PCP   reports that she quit smoking about 21 years ago. Her smoking use included cigarettes. She has a 1.25 pack-year smoking history. She has never used smokeless tobacco. She reports that she drinks about 1.0 - 2.0 standard drinks of alcohol per week. She reports that she does not use drugs.  Past Medical History:  Diagnosis Date  . Acute MI ([place]) 11/2004   NONSTEMI 2005 NOINTERVENTION PER NOTE   . Anemia    from bypass- iron and B12  . Anxiety   . Arthritis   . Coronary artery disease   . Depression   . Fibromyalgia   . GERD (gastroesophageal reflux disease)   . GIB  (gastrointestinal bleeding)    while on celecoxib post bypass  . Hyperlipidemia   . Hyperlipidemia LDL goal < 130 01/23/2014  . Hypertension   . Obesity    hx of bipass, now resolved.  borderline diabetes also resolved.  . Osteopenia   . Post-menopausal   . Scoliosis   . Statin intolerance 01/23/2014    Past Surgical History:  Procedure Laterality Date  . CARDIAC CATHETERIZATION     2005 Severely disease small optional diagonal vessel responsible far wall motion abnormalities wise to small and diffusely diseased for consideration of interventio.  Marland Kitchen CATARACT EXTRACTION    . COLONOSCOPY    . GASTRIC BYPASS  01/2004  . KNEE ARTHROSCOPY Right   . LAPAROSCOPIC CHOLECYSTECTOMY  1993  . TOTAL VAGINAL HYSTERECTOMY  1973   secondary to DUB - Dr. Connye Burkitt and Dr. Tamala Julian    Current Outpatient Medications  Medication Sig Dispense Refill  . acetaminophen (TYLENOL) 650 MG CR tablet Take 650 mg by mouth as needed for pain.     Marland Kitchen ALPRAZolam (XANAX) 0.5 MG tablet TAKE 1 TABLET BY MOUTH AS NEEDED FOR SLEEP  1  . amLODipine (NORVASC) 5 MG tablet Take 5 mg by mouth daily with breakfast.    . clopidogrel (PLAVIX) 75 MG tablet TAKE 1 TABLET (75 MG TOTAL) BY MOUTH DAILY. PLEASE SCHEDULE APPOINTMENT FOR REFILLS. 7 tablet 0  . DULoxetine (CYMBALTA) 60 MG capsule Take 60 mg by mouth every morning.  0  . enalapril (VASOTEC) 5 MG tablet Take 5 mg by mouth daily with breakfast.    . metoCLOPramide (REGLAN) 5 MG tablet Take 5 mg by mouth 3 (three) times daily before meals.    . Multiple Vitamin (MULTIVITAMIN WITH MINERALS) TABS Take 2 tablets by mouth daily.    . pantoprazole (PROTONIX) 40 MG tablet Take 40 mg by mouth daily with breakfast.    . pregabalin (LYRICA) 25 MG capsule TAKE ONE CAPSULE BY MOUTH MIDDAY IN ADDITION TO THE 75MG  CAPSULE  3  . pregabalin (LYRICA) 75 MG capsule Take 75 mg by mouth daily.  3  . PRESCRIPTION MEDICATION Inject 1 application into the muscle. INJECTION every 21-30 days    . Vitamin  D, Ergocalciferol, (DRISDOL) 50000 UNITS CAPS Take 50,000 Units by mouth every Monday, Wednesday, and Friday.      No current facility-administered medications for this visit.     Family History  Problem Relation Age of Onset  . Heart failure Mother   . Liver cancer Father   . Cancer Brother   . Heart failure Maternal Grandmother   . Aneurysm Maternal Grandmother        ruptured abdominal aortic aneurysm  . Lung cancer Brother 65    ROS:  Pertinent items are noted in HPI.  Otherwise, a comprehensive ROS was negative.  Exam:   BP 110/68   Pulse 68   Resp 16   Ht 5' 1.5" (1.562 m)   Wt 134 lb (60.8 kg)   LMP 03/18/1972 (Approximate)   BMI 24.91 kg/m  Height: 5' 1.5" (156.2 cm) Ht Readings from Last 3 Encounters:  09/12/18 5' 1.5" (1.562 m)  01/17/18 5' 2.5" (1.588 m)  09/06/17 5' 1.75" (1.568 m)    General appearance: alert, cooperative and appears stated age Head: Normocephalic, without obvious abnormality, atraumatic Neck: no adenopathy, supple, symmetrical, trachea midline and thyroid normal to inspection and palpation Lungs: clear to auscultation bilaterally Breasts: normal appearance, no masses or tenderness, No nipple retraction or dimpling, No nipple discharge or bleeding, No axillary or supraclavicular adenopathy Heart: regular rate and rhythm with murmur  noted(history of per patient) Abdomen: soft, non-tender; no masses,  no organomegaly Extremities: extremities normal, atraumatic, no cyanosis or edema Skin: Skin color, texture, turgor normal. No rashes or lesions Lymph nodes: Cervical, supraclavicular, and axillary nodes normal. No abnormal inguinal nodes palpated Neurologic: Grossly normal   Pelvic: External genitalia:  no lesions              Urethra:  normal appearing urethra with no masses, tenderness or lesions              Bartholin's and Skene's: normal                 Vagina: normal appearing vagina with normal color and discharge, no lesions               Cervix: absent              Pap taken: No. Bimanual Exam:  Uterus:  uterus absent              Adnexa: normal adnexa and no mass, fullness, tenderness               Rectovaginal: Confirms               Anus:  normal sphincter tone, no lesions  Chaperone present: yes  A:  Well Woman with normal exam  Post menopausal S/P  TVH due to DUB ovaries retained  Fibromyalgia, Hypertension, cholesterol, Osteopenia, Depression with MD management. All stable at present per patient.  P:   Reviewed health and wellness pertinent to exam  Continue follow up with MD as indicated  Pap smear: no   counseled on breast self exam, mammography screening, feminine hygiene, adequate intake of calcium and vitamin D, diet and exercise  return annually or prn  An After Visit Summary was printed and given to the patient.

## 2018-09-12 NOTE — Patient Instructions (Signed)

## 2018-09-16 ENCOUNTER — Encounter: Payer: Self-pay | Admitting: Certified Nurse Midwife

## 2018-09-17 DIAGNOSIS — F3181 Bipolar II disorder: Secondary | ICD-10-CM | POA: Diagnosis not present

## 2018-10-23 DIAGNOSIS — F3181 Bipolar II disorder: Secondary | ICD-10-CM | POA: Diagnosis not present

## 2018-11-08 DIAGNOSIS — H40013 Open angle with borderline findings, low risk, bilateral: Secondary | ICD-10-CM | POA: Diagnosis not present

## 2018-11-08 DIAGNOSIS — H04123 Dry eye syndrome of bilateral lacrimal glands: Secondary | ICD-10-CM | POA: Diagnosis not present

## 2018-12-05 DIAGNOSIS — F3181 Bipolar II disorder: Secondary | ICD-10-CM | POA: Diagnosis not present

## 2019-01-08 DIAGNOSIS — F3181 Bipolar II disorder: Secondary | ICD-10-CM | POA: Diagnosis not present

## 2019-01-15 DIAGNOSIS — I10 Essential (primary) hypertension: Secondary | ICD-10-CM | POA: Diagnosis not present

## 2019-01-15 DIAGNOSIS — E7849 Other hyperlipidemia: Secondary | ICD-10-CM | POA: Diagnosis not present

## 2019-01-15 DIAGNOSIS — M859 Disorder of bone density and structure, unspecified: Secondary | ICD-10-CM | POA: Diagnosis not present

## 2019-01-22 ENCOUNTER — Other Ambulatory Visit (HOSPITAL_COMMUNITY): Payer: Self-pay | Admitting: Internal Medicine

## 2019-01-22 ENCOUNTER — Other Ambulatory Visit: Payer: Self-pay | Admitting: Internal Medicine

## 2019-01-22 DIAGNOSIS — I2510 Atherosclerotic heart disease of native coronary artery without angina pectoris: Secondary | ICD-10-CM | POA: Diagnosis not present

## 2019-01-22 DIAGNOSIS — Z6825 Body mass index (BMI) 25.0-25.9, adult: Secondary | ICD-10-CM | POA: Diagnosis not present

## 2019-01-22 DIAGNOSIS — Z1331 Encounter for screening for depression: Secondary | ICD-10-CM | POA: Diagnosis not present

## 2019-01-22 DIAGNOSIS — Z1339 Encounter for screening examination for other mental health and behavioral disorders: Secondary | ICD-10-CM | POA: Diagnosis not present

## 2019-01-22 DIAGNOSIS — Z0000 Encounter for general adult medical examination without abnormal findings: Secondary | ICD-10-CM | POA: Diagnosis not present

## 2019-01-22 DIAGNOSIS — I10 Essential (primary) hypertension: Secondary | ICD-10-CM | POA: Diagnosis not present

## 2019-01-22 DIAGNOSIS — R011 Cardiac murmur, unspecified: Secondary | ICD-10-CM | POA: Diagnosis not present

## 2019-01-22 DIAGNOSIS — M546 Pain in thoracic spine: Secondary | ICD-10-CM | POA: Diagnosis not present

## 2019-01-22 DIAGNOSIS — F418 Other specified anxiety disorders: Secondary | ICD-10-CM | POA: Diagnosis not present

## 2019-01-22 DIAGNOSIS — R3129 Other microscopic hematuria: Secondary | ICD-10-CM | POA: Diagnosis not present

## 2019-01-22 DIAGNOSIS — G458 Other transient cerebral ischemic attacks and related syndromes: Secondary | ICD-10-CM | POA: Diagnosis not present

## 2019-01-22 DIAGNOSIS — R82998 Other abnormal findings in urine: Secondary | ICD-10-CM | POA: Diagnosis not present

## 2019-01-22 DIAGNOSIS — R945 Abnormal results of liver function studies: Secondary | ICD-10-CM | POA: Diagnosis not present

## 2019-01-24 DIAGNOSIS — Z1212 Encounter for screening for malignant neoplasm of rectum: Secondary | ICD-10-CM | POA: Diagnosis not present

## 2019-01-30 ENCOUNTER — Ambulatory Visit (HOSPITAL_COMMUNITY): Payer: Medicare Other | Attending: Cardiology

## 2019-01-30 DIAGNOSIS — R011 Cardiac murmur, unspecified: Secondary | ICD-10-CM | POA: Diagnosis not present

## 2019-02-06 DIAGNOSIS — R945 Abnormal results of liver function studies: Secondary | ICD-10-CM | POA: Diagnosis not present

## 2019-02-21 DIAGNOSIS — Z1231 Encounter for screening mammogram for malignant neoplasm of breast: Secondary | ICD-10-CM | POA: Diagnosis not present

## 2019-03-05 DIAGNOSIS — F3181 Bipolar II disorder: Secondary | ICD-10-CM | POA: Diagnosis not present

## 2019-05-07 DIAGNOSIS — F3181 Bipolar II disorder: Secondary | ICD-10-CM | POA: Diagnosis not present

## 2019-06-10 DIAGNOSIS — H40013 Open angle with borderline findings, low risk, bilateral: Secondary | ICD-10-CM | POA: Diagnosis not present

## 2019-06-10 DIAGNOSIS — H52223 Regular astigmatism, bilateral: Secondary | ICD-10-CM | POA: Diagnosis not present

## 2019-06-10 DIAGNOSIS — H04123 Dry eye syndrome of bilateral lacrimal glands: Secondary | ICD-10-CM | POA: Diagnosis not present

## 2019-06-10 DIAGNOSIS — H524 Presbyopia: Secondary | ICD-10-CM | POA: Diagnosis not present

## 2019-06-17 DIAGNOSIS — Z79899 Other long term (current) drug therapy: Secondary | ICD-10-CM | POA: Diagnosis not present

## 2019-06-17 DIAGNOSIS — I10 Essential (primary) hypertension: Secondary | ICD-10-CM | POA: Diagnosis not present

## 2019-07-18 DIAGNOSIS — H40013 Open angle with borderline findings, low risk, bilateral: Secondary | ICD-10-CM | POA: Diagnosis not present

## 2019-07-18 DIAGNOSIS — H04123 Dry eye syndrome of bilateral lacrimal glands: Secondary | ICD-10-CM | POA: Diagnosis not present

## 2019-07-18 DIAGNOSIS — H26491 Other secondary cataract, right eye: Secondary | ICD-10-CM | POA: Diagnosis not present

## 2019-08-06 DIAGNOSIS — I350 Nonrheumatic aortic (valve) stenosis: Secondary | ICD-10-CM | POA: Diagnosis not present

## 2019-08-06 DIAGNOSIS — G459 Transient cerebral ischemic attack, unspecified: Secondary | ICD-10-CM | POA: Diagnosis not present

## 2019-08-06 DIAGNOSIS — F329 Major depressive disorder, single episode, unspecified: Secondary | ICD-10-CM | POA: Diagnosis not present

## 2019-08-06 DIAGNOSIS — H3530 Unspecified macular degeneration: Secondary | ICD-10-CM | POA: Diagnosis not present

## 2019-08-06 DIAGNOSIS — I10 Essential (primary) hypertension: Secondary | ICD-10-CM | POA: Diagnosis not present

## 2019-08-06 DIAGNOSIS — R945 Abnormal results of liver function studies: Secondary | ICD-10-CM | POA: Diagnosis not present

## 2019-08-06 DIAGNOSIS — I2510 Atherosclerotic heart disease of native coronary artery without angina pectoris: Secondary | ICD-10-CM | POA: Diagnosis not present

## 2019-08-06 DIAGNOSIS — I351 Nonrheumatic aortic (valve) insufficiency: Secondary | ICD-10-CM | POA: Diagnosis not present

## 2019-08-06 DIAGNOSIS — I519 Heart disease, unspecified: Secondary | ICD-10-CM | POA: Diagnosis not present

## 2019-09-04 DIAGNOSIS — U071 COVID-19: Secondary | ICD-10-CM | POA: Diagnosis not present

## 2019-09-11 ENCOUNTER — Other Ambulatory Visit: Payer: Self-pay

## 2019-09-15 ENCOUNTER — Ambulatory Visit (INDEPENDENT_AMBULATORY_CARE_PROVIDER_SITE_OTHER): Payer: Medicare Other | Admitting: Certified Nurse Midwife

## 2019-09-15 ENCOUNTER — Other Ambulatory Visit: Payer: Self-pay

## 2019-09-15 ENCOUNTER — Encounter: Payer: Self-pay | Admitting: Certified Nurse Midwife

## 2019-09-15 VITALS — BP 118/60 | HR 64 | Temp 97.2°F | Resp 16 | Ht 61.75 in | Wt 145.0 lb

## 2019-09-15 DIAGNOSIS — Z124 Encounter for screening for malignant neoplasm of cervix: Secondary | ICD-10-CM

## 2019-09-15 DIAGNOSIS — Z780 Asymptomatic menopausal state: Secondary | ICD-10-CM | POA: Diagnosis not present

## 2019-09-15 DIAGNOSIS — Z01419 Encounter for gynecological examination (general) (routine) without abnormal findings: Secondary | ICD-10-CM | POA: Diagnosis not present

## 2019-09-15 DIAGNOSIS — Z8659 Personal history of other mental and behavioral disorders: Secondary | ICD-10-CM

## 2019-09-15 NOTE — Patient Instructions (Addendum)
EXERCISE AND DIET:  We recommended that you start or continue a regular exercise program for good health. Regular exercise means any activity that makes your heart beat faster and makes you sweat.  We recommend exercising at least 30 minutes per day at least 3 days a week, preferably 4 or 5.  We also recommend a diet low in fat and sugar.  Inactivity, poor dietary choices and obesity can cause diabetes, heart attack, stroke, and kidney damage, among others.    ALCOHOL AND SMOKING:  Women should limit their alcohol intake to no more than 7 drinks/beers/glasses of wine (combined, not each!) per week. Moderation of alcohol intake to this level decreases your risk of breast cancer and liver damage. And of course, no recreational drugs are part of a healthy lifestyle.  And absolutely no smoking or even second hand smoke. Most people know smoking can cause heart and lung diseases, but did you know it also contributes to weakening of your bones? Aging of your skin?  Yellowing of your teeth and nails?  CALCIUM AND VITAMIN D:  Adequate intake of calcium and Vitamin D are recommended.  The recommendations for exact amounts of these supplements seem to change often, but generally speaking 600 mg of calcium (either carbonate or citrate) and 800 units of Vitamin D per day seems prudent. Certain women may benefit from higher intake of Vitamin D.  If you are among these women, your doctor will have told you during your visit.    PAP SMEARS:  Pap smears, to check for cervical cancer or precancers,  have traditionally been done yearly, although recent scientific advances have shown that most women can have pap smears less often.  However, every woman still should have a physical exam from her gynecologist every year. It will include a breast check, inspection of the vulva and vagina to check for abnormal growths or skin changes, a visual exam of the cervix, and then an exam to evaluate the size and shape of the uterus and  ovaries.  And after 73 years of age, a rectal exam is indicated to check for rectal cancers. We will also provide age appropriate advice regarding health maintenance, like when you should have certain vaccines, screening for sexually transmitted diseases, bone density testing, colonoscopy, mammograms, etc.   MAMMOGRAMS:  All women over 40 years old should have a yearly mammogram. Many facilities now offer a "3D" mammogram, which may cost around $50 extra out of pocket. If possible,  we recommend you accept the option to have the 3D mammogram performed.  It both reduces the number of women who will be called back for extra views which then turn out to be normal, and it is better than the routine mammogram at detecting truly abnormal areas.    COLONOSCOPY:  Colonoscopy to screen for colon cancer is recommended for all women at age 50.  We know, you hate the idea of the prep.  We agree, BUT, having colon cancer and not knowing it is worse!!  Colon cancer so often starts as a polyp that can be seen and removed at colonscopy, which can quite literally save your life!  And if your first colonoscopy is normal and you have no family history of colon cancer, most women don't have to have it again for 10 years.  Once every ten years, you can do something that may end up saving your life, right?  We will be happy to help you get it scheduled when you are ready.    Be sure to check your insurance coverage so you understand how much it will cost.  It may be covered as a preventative service at no cost, but you should check your particular policy.      Kegel Exercises  Kegel exercises can help strengthen your pelvic floor muscles. The pelvic floor is a group of muscles that support your rectum, small intestine, and bladder. In females, pelvic floor muscles also help support the womb (uterus). These muscles help you control the flow of urine and stool. Kegel exercises are painless and simple, and they do not require any  equipment. Your provider may suggest Kegel exercises to:  Improve bladder and bowel control.  Improve sexual response.  Improve weak pelvic floor muscles after surgery to remove the uterus (hysterectomy) or pregnancy (females).  Improve weak pelvic floor muscles after prostate gland removal or surgery (males). Kegel exercises involve squeezing your pelvic floor muscles, which are the same muscles you squeeze when you try to stop the flow of urine or keep from passing gas. The exercises can be done while sitting, standing, or lying down, but it is best to vary your position. Exercises How to do Kegel exercises: 1. Squeeze your pelvic floor muscles tight. You should feel a tight lift in your rectal area. If you are a female, you should also feel a tightness in your vaginal area. Keep your stomach, buttocks, and legs relaxed. 2. Hold the muscles tight for up to 10 seconds. 3. Breathe normally. 4. Relax your muscles. 5. Repeat as told by your health care provider. Repeat this exercise daily as told by your health care provider. Continue to do this exercise for at least 4-6 weeks, or for as long as told by your health care provider. You may be referred to a physical therapist who can help you learn more about how to do Kegel exercises. Depending on your condition, your health care provider may recommend:  Varying how long you squeeze your muscles.  Doing several sets of exercises every day.  Doing exercises for several weeks.  Making Kegel exercises a part of your regular exercise routine. This information is not intended to replace advice given to you by your health care provider. Make sure you discuss any questions you have with your health care provider. Document Released: 11/20/2012 Document Revised: 07/24/2018 Document Reviewed: 07/24/2018 Elsevier Patient Education  2020 [employer].

## 2019-09-15 NOTE — Progress Notes (Signed)
73 y.o. G53P2002 Married  Caucasian Fe here for annual exam. Post menopausal,no vaginal dryness or vaginal bleeding. Sees Dr. Joylene Draft for aex and labs, all normal per patient. Sees Psychiatrist for Depakote and Cymbalta all medications stable. Lyrica seems to help with joint pain. Continues with Hypertension medication no change. Stays in touch with family. Occasional urinary incontinence with waiting to long to void. No UTI symptoms. No other health issues today.  Patient's last menstrual period was 03/18/1972 (approximate).          Sexually active: No.  The current method of family planning is status post hysterectomy.    Exercising: No.  exercise Smoker:  no  Review of Systems  Constitutional: Negative.   HENT: Negative.   Eyes: Negative.   Respiratory: Negative.   Cardiovascular: Negative.   Gastrointestinal: Negative.   Genitourinary: Negative.   Musculoskeletal: Negative.   Skin: Negative.   Neurological: Negative.   Endo/Heme/Allergies: Negative.   Psychiatric/Behavioral: Negative.     Health Maintenance: Pap:  6/03 neg History of Abnormal Pap: no MMG:  2020 neg per patient Self Breast exams: occ Colonoscopy:  2014 f/u 30yrs BMD:   2019 osteopenia, pcp manages TDaP:  UTD with pcp Shingles: 2019 Pneumonia: 2015 Hep C and HIV: not done Labs:PCP   reports that she quit smoking about 22 years ago. Her smoking use included cigarettes. She has a 1.25 pack-year smoking history. She has never used smokeless tobacco. She reports current alcohol use of about 1.0 - 2.0 standard drinks of alcohol per week. She reports that she does not use drugs.  Past Medical History:  Diagnosis Date  . Acute MI ([place]) 11/2004   NONSTEMI 2005 NOINTERVENTION PER NOTE   . Anemia    from bypass- iron and B12  . Anxiety   . Arthritis   . Coronary artery disease   . Depression   . Fibromyalgia   . GERD (gastroesophageal reflux disease)   . GIB (gastrointestinal bleeding)    while on celecoxib  post bypass  . Hyperlipidemia   . Hyperlipidemia LDL goal < 130 01/23/2014  . Hypertension   . Obesity    hx of bipass, now resolved.  borderline diabetes also resolved.  . Osteopenia   . Post-menopausal   . Scoliosis   . Statin intolerance 01/23/2014    Past Surgical History:  Procedure Laterality Date  . CARDIAC CATHETERIZATION     2005 Severely disease small optional diagonal vessel responsible far wall motion abnormalities wise to small and diffusely diseased for consideration of interventio.  Marland Kitchen CATARACT EXTRACTION    . COLONOSCOPY    . GASTRIC BYPASS  01/2004  . KNEE ARTHROSCOPY Right   . LAPAROSCOPIC CHOLECYSTECTOMY  1993  . TOTAL VAGINAL HYSTERECTOMY  1973   secondary to DUB - Dr. Connye Burkitt and Dr. Tamala Julian    Current Outpatient Medications  Medication Sig Dispense Refill  . acetaminophen (TYLENOL) 650 MG CR tablet Take 650 mg by mouth as needed for pain.     Marland Kitchen ALPRAZolam (XANAX) 0.5 MG tablet TAKE 1 TABLET BY MOUTH AS NEEDED FOR SLEEP  1  . amLODipine (NORVASC) 5 MG tablet Take 5 mg by mouth daily with breakfast.    . clopidogrel (PLAVIX) 75 MG tablet TAKE 1 TABLET (75 MG TOTAL) BY MOUTH DAILY. PLEASE SCHEDULE APPOINTMENT FOR REFILLS. 7 tablet 0  . DULoxetine (CYMBALTA) 60 MG capsule Take 60 mg by mouth every morning.  0  . enalapril (VASOTEC) 5 MG tablet Take 5 mg by mouth  daily with breakfast.    . metoCLOPramide (REGLAN) 5 MG tablet Take 5 mg by mouth 3 (three) times daily before meals.    . Multiple Vitamin (MULTIVITAMIN WITH MINERALS) TABS Take 2 tablets by mouth daily.    . pantoprazole (PROTONIX) 40 MG tablet Take 40 mg by mouth daily with breakfast.    . pregabalin (LYRICA) 25 MG capsule TAKE ONE CAPSULE BY MOUTH MIDDAY IN ADDITION TO THE 75MG  CAPSULE  3  . pregabalin (LYRICA) 75 MG capsule Take 75 mg by mouth daily.  3  . PRESCRIPTION MEDICATION Inject 1 application into the muscle. INJECTION every 21-30 days    . Vitamin D, Ergocalciferol, (DRISDOL) 50000 UNITS CAPS Take  50,000 Units by mouth every Monday, Wednesday, and Friday.      No current facility-administered medications for this visit.     Family History  Problem Relation Age of Onset  . Heart failure Mother   . Liver cancer Father   . Cancer Brother   . Heart failure Maternal Grandmother   . Aneurysm Maternal Grandmother        ruptured abdominal aortic aneurysm  . Lung cancer Brother 65    ROS:  Pertinent items are noted in HPI.  Otherwise, a comprehensive ROS was negative.  Exam:   LMP 03/18/1972 (Approximate)    Ht Readings from Last 3 Encounters:  09/12/18 5' 1.5" (1.562 m)  01/17/18 5' 2.5" (1.588 m)  09/06/17 5' 1.75" (1.568 m)    General appearance: alert, cooperative and appears stated age Head: Normocephalic, without obvious abnormality, atraumatic Neck: no adenopathy, supple, symmetrical, trachea midline and thyroid normal to inspection and palpation Lungs: clear to auscultation bilaterally Breasts: normal appearance, no masses or tenderness, No nipple retraction or dimpling, No nipple discharge or bleeding, No axillary or supraclavicular adenopathy Heart: regular rate and rhythm Abdomen: soft, non-tender; no masses,  no organomegaly Extremities: extremities normal, atraumatic, no cyanosis or edema Skin: Skin color, texture, turgor normal. No rashes or lesions Lymph nodes: Cervical, supraclavicular, and axillary nodes normal. No abnormal inguinal nodes palpated Neurologic: Grossly normal   Pelvic: External genitalia:  no lesions              Urethra:  normal appearing urethra with no masses, tenderness or lesions              Bartholin's and Skene's: normal                 Vagina: normal appearing vagina with normal color and discharge, no lesions              Cervix: absent              Pap taken: No. Bimanual Exam:  Uterus:  uterus absent              Adnexa: no mass, fullness, tenderness               Rectovaginal: Confirms, good tone               Anus:  normal  sphincter tone, no lesions  Chaperone present: yes  A:  Well Woman with normal exam  Post menopausal   Stress incontinence occasional  Hypertension, depression, joint pain labs with MD, no changes  P:   Reviewed health and wellness pertinent to exam  Aware of need to advise if vaginal bleeding.  Discussed not waiting to long to urinate. Discussed kegel exercise to help with this. Printed information given also.  Continue  to follow up with MD as indicated.  Pap smear: no   counseled on breast self exam, STD prevention, feminine hygiene, menopause, osteoporosis, adequate intake of calcium and vitamin D, diet and exercise, Kegel's exercises  return annually or prn  An After Visit Summary was printed and given to the patient.

## 2019-09-18 ENCOUNTER — Ambulatory Visit: Payer: Medicare Other | Admitting: Certified Nurse Midwife

## 2019-10-23 DIAGNOSIS — D6489 Other specified anemias: Secondary | ICD-10-CM | POA: Diagnosis not present

## 2019-10-23 DIAGNOSIS — E7849 Other hyperlipidemia: Secondary | ICD-10-CM | POA: Diagnosis not present

## 2019-10-23 DIAGNOSIS — F419 Anxiety disorder, unspecified: Secondary | ICD-10-CM | POA: Diagnosis not present

## 2019-10-23 DIAGNOSIS — R945 Abnormal results of liver function studies: Secondary | ICD-10-CM | POA: Diagnosis not present

## 2019-10-23 DIAGNOSIS — M791 Myalgia, unspecified site: Secondary | ICD-10-CM | POA: Diagnosis not present

## 2019-10-23 DIAGNOSIS — G4700 Insomnia, unspecified: Secondary | ICD-10-CM | POA: Diagnosis not present

## 2019-10-23 DIAGNOSIS — F329 Major depressive disorder, single episode, unspecified: Secondary | ICD-10-CM | POA: Diagnosis not present

## 2019-10-23 DIAGNOSIS — I10 Essential (primary) hypertension: Secondary | ICD-10-CM | POA: Diagnosis not present

## 2019-10-23 DIAGNOSIS — I2510 Atherosclerotic heart disease of native coronary artery without angina pectoris: Secondary | ICD-10-CM | POA: Diagnosis not present

## 2019-10-23 DIAGNOSIS — Z23 Encounter for immunization: Secondary | ICD-10-CM | POA: Diagnosis not present

## 2019-10-23 DIAGNOSIS — M546 Pain in thoracic spine: Secondary | ICD-10-CM | POA: Diagnosis not present

## 2019-10-23 DIAGNOSIS — M2550 Pain in unspecified joint: Secondary | ICD-10-CM | POA: Diagnosis not present

## 2019-11-04 DIAGNOSIS — Z6827 Body mass index (BMI) 27.0-27.9, adult: Secondary | ICD-10-CM | POA: Diagnosis not present

## 2019-11-04 DIAGNOSIS — M2550 Pain in unspecified joint: Secondary | ICD-10-CM | POA: Diagnosis not present

## 2019-11-04 DIAGNOSIS — E663 Overweight: Secondary | ICD-10-CM | POA: Diagnosis not present

## 2019-11-18 DIAGNOSIS — H04123 Dry eye syndrome of bilateral lacrimal glands: Secondary | ICD-10-CM | POA: Diagnosis not present

## 2019-11-18 DIAGNOSIS — H26491 Other secondary cataract, right eye: Secondary | ICD-10-CM | POA: Diagnosis not present

## 2019-11-18 DIAGNOSIS — H40013 Open angle with borderline findings, low risk, bilateral: Secondary | ICD-10-CM | POA: Diagnosis not present

## 2019-11-26 ENCOUNTER — Other Ambulatory Visit: Payer: Self-pay

## 2019-11-26 ENCOUNTER — Ambulatory Visit (INDEPENDENT_AMBULATORY_CARE_PROVIDER_SITE_OTHER): Payer: Medicare Other | Admitting: Otolaryngology

## 2019-11-26 ENCOUNTER — Encounter (INDEPENDENT_AMBULATORY_CARE_PROVIDER_SITE_OTHER): Payer: Self-pay | Admitting: Otolaryngology

## 2019-11-26 VITALS — Temp 97.3°F

## 2019-11-26 DIAGNOSIS — H6121 Impacted cerumen, right ear: Secondary | ICD-10-CM | POA: Diagnosis not present

## 2019-11-26 NOTE — Progress Notes (Signed)
HPI: Kimberly Stone is a 73 y.o. female who presents for evaluation of right ear blockage she has had for 2 weeks.  No pain or discomfort..  Past Medical History:  Diagnosis Date  . Acute MI ([place]) 11/2004   NONSTEMI 2005 NOINTERVENTION PER NOTE   . Anemia    from bypass- iron and B12  . Anxiety   . Arthritis   . Coronary artery disease   . Depression   . Fibromyalgia   . GERD (gastroesophageal reflux disease)   . GIB (gastrointestinal bleeding)    while on celecoxib post bypass  . Hyperlipidemia   . Hyperlipidemia LDL goal < 130 01/23/2014  . Hypertension   . Obesity    hx of bipass, now resolved.  borderline diabetes also resolved.  . Osteopenia   . Post-menopausal   . Scoliosis   . Statin intolerance 01/23/2014   Past Surgical History:  Procedure Laterality Date  . CARDIAC CATHETERIZATION     2005 Severely disease small optional diagonal vessel responsible far wall motion abnormalities wise to small and diffusely diseased for consideration of interventio.  Marland Kitchen CATARACT EXTRACTION    . COLONOSCOPY    . GASTRIC BYPASS  01/2004  . KNEE ARTHROSCOPY Right   . LAPAROSCOPIC CHOLECYSTECTOMY  1993  . TOTAL VAGINAL HYSTERECTOMY  1973   secondary to DUB - Dr. Connye Burkitt and Dr. Tamala Julian   Social History   Socioeconomic History  . Marital status: Married    Spouse name: Clifton James  . Number of children: 3  . Years of education: LPN  . Highest education level: Not on file  Occupational History  . Occupation: Retired  Scientific laboratory technician  . Financial resource strain: Not on file  . Food insecurity    Worry: Not on file    Inability: Not on file  . Transportation needs    Medical: Not on file    Non-medical: Not on file  Tobacco Use  . Smoking status: Former Smoker    Packs/day: 0.25    Years: 5.00    Pack years: 1.25    Types: Cigarettes    Start date: 16    Quit date: 12/18/1996    Years since quitting: 22.9  . Smokeless tobacco: Never Used  Substance and Sexual Activity  . Alcohol  use: Yes    Alcohol/week: 1.0 - 2.0 standard drinks    Types: 1 - 2 Glasses of wine per week    Comment: occasional  wine.    . Drug use: No  . Sexual activity: Not Currently    Birth control/protection: Surgical    Comment: hysterectomy-1973  Lifestyle  . Physical activity    Days per week: Not on file    Minutes per session: Not on file  . Stress: Not on file  Relationships  . Social Herbalist on phone: Not on file    Gets together: Not on file    Attends religious service: Not on file    Active member of club or organization: Not on file    Attends meetings of clubs or organizations: Not on file    Relationship status: Not on file  Other Topics Concern  . Not on file  Social History Narrative   Pt lives at home with family.  Drives and does not use assist device.     Caffeine Use: 2 cups daily         Family History  Problem Relation Age of Onset  . Heart failure  Mother   . Liver cancer Father   . Cancer Brother   . Heart failure Maternal Grandmother   . Aneurysm Maternal Grandmother        ruptured abdominal aortic aneurysm  . Lung cancer Brother 59   Allergies  Allergen Reactions  . Aspirin Other (See Comments)    PT CANNOT TAKE DUE TO HX OF GASTRIC BYPASS SURGERY   . Imodium [Loperamide] Palpitations  . Statins     Other reaction(s): Other Joint pain  . Erythromycin Other (See Comments)    UNKNOWN   Prior to Admission medications   Medication Sig Start Date End Date Taking? Authorizing Provider  acetaminophen (TYLENOL) 650 MG CR tablet Take 650 mg by mouth as needed for pain.  12/03/14  Yes [provider]  amLODipine (NORVASC) 5 MG tablet Take 5 mg by mouth daily with breakfast.   Yes [provider]  clopidogrel (PLAVIX) 75 MG tablet TAKE 1 TABLET (75 MG TOTAL) BY MOUTH DAILY. PLEASE SCHEDULE APPOINTMENT FOR REFILLS. 08/27/17  Yes Croitoru, Mihai, MD  Divalproex Sodium (DEPAKOTE PO) Take by mouth.   Yes [provider]  DULoxetine (CYMBALTA) 60 MG capsule Take 60 mg by mouth every morning. 08/07/18  Yes [provider]  enalapril (VASOTEC) 5 MG tablet Take 5 mg by mouth daily with breakfast.   Yes [provider]  metoCLOPramide (REGLAN) 5 MG tablet Take 5 mg by mouth 3 (three) times daily before meals.   Yes [provider]  Multiple Vitamin (MULTIVITAMIN WITH MINERALS) TABS Take 2 tablets by mouth daily.   Yes [provider]  pantoprazole (PROTONIX) 40 MG tablet Take 40 mg by mouth daily with breakfast.   Yes [provider]  pregabalin (LYRICA) 75 MG capsule Take 75 mg by mouth 2 (two) times daily.  08/09/18  Yes [provider]  PRESCRIPTION MEDICATION Inject 1 application into the muscle. INJECTION every 21-30 days   Yes [provider]  Vitamin D, Ergocalciferol, (DRISDOL) 50000 UNITS CAPS Take 50,000 Units by mouth every Monday, Wednesday, and Friday.    Yes [provider]     Positive ROS: Negative  All other systems have been reviewed and were otherwise negative with the exception of those mentioned in the HPI and as above.  Physical Exam: Constitutional: Alert, well-appearing, no acute distress Ears: External ears without lesions or tenderness. Ear canals left ear canal and TM are clear right ear canal is obstructed with cerumen.  AC > BC bilaterally hearing symmetric. Nasal: External nose without lesions. Clear nasal passages Oral: Oropharynx clear. Neck: No palpable adenopathy or masses Respiratory: Breathing comfortably  Skin: No facial/neck lesions or rash noted.  Cerumen impaction removal  Date/Time: 11/26/2019 10:30 AM Performed by: Rozetta Nunnery, MD Authorized by: Rozetta Nunnery, MD   Consent:    Consent obtained:  Verbal   Consent given by:  Patient   Risks discussed:  Pain and bleeding Procedure details:    Location:  R ear   Procedure type: forceps   Post-procedure details:     Inspection:  TM intact and canal normal   Hearing quality:  Improved   Patient tolerance of procedure:  Tolerated well, no immediate complications    Assessment: Right cerumen impaction  Plan: This was cleaned in the office she will follow-up as needed.  Radene Journey, MD

## 2019-12-02 DIAGNOSIS — M545 Low back pain: Secondary | ICD-10-CM | POA: Diagnosis not present

## 2020-03-10 ENCOUNTER — Encounter: Payer: Self-pay | Admitting: Certified Nurse Midwife

## 2020-04-27 ENCOUNTER — Ambulatory Visit: Payer: Medicare Other | Admitting: Physician Assistant

## 2020-04-27 ENCOUNTER — Encounter: Payer: Self-pay | Admitting: *Deleted

## 2020-09-16 ENCOUNTER — Encounter: Payer: Self-pay | Admitting: Cardiovascular Disease

## 2020-09-16 ENCOUNTER — Other Ambulatory Visit: Payer: Self-pay

## 2020-09-16 ENCOUNTER — Ambulatory Visit (INDEPENDENT_AMBULATORY_CARE_PROVIDER_SITE_OTHER): Payer: Medicare Other | Admitting: Cardiovascular Disease

## 2020-09-16 VITALS — BP 116/72 | HR 65 | Ht 62.0 in | Wt 158.2 lb

## 2020-09-16 DIAGNOSIS — I350 Nonrheumatic aortic (valve) stenosis: Secondary | ICD-10-CM

## 2020-09-16 NOTE — Progress Notes (Signed)
Patient ID: Kimberly Stone, female   DOB: 1946-07-04, 74 y.o.   MRN: [id number]      Reason for office visit CAD, hyperlipidemia, statin intolerance  The patient is a 74 y.o. year-old female with history of CAD (NSTEMI 2005 found to have severe disease in ramus intermedius too diffusely diseased to allow PCI), mild calcific aortic stenosis (mean gradient 14 mmHg in February 2020), HTN, HLD, GERD, previous obesity and pre-diabetes s/p gastric bypass surgery.  She gets a little short of breath when "moving very fast".  She can perform household chores without problems.  The patient specifically denies any chest pain at rest or with exertion, dyspnea at rest or with usual activity, orthopnea, paroxysmal nocturnal dyspnea, syncope, palpitations, focal neurological deficits, intermittent claudication, lower extremity edema, unexplained weight gain, cough, hemoptysis or wheezing.  She has significant hyperlipidemia and since 2006 has tried treatment with simvastatin, atorvastatin, rosuvastatin, Livalo and Zetia but was intolerant to all of these secondary to liver abnormalities and/or myalgia. She received Repatha, but then had to switch to Praluent due to insurance requirements.  Her most recent lipid profile from February 25, 2020 showed an LDL cholesterol 149 and a total cholesterol 256.   Allergies  Allergen Reactions  . Aspirin Other (See Comments)    PT CANNOT TAKE DUE TO HX OF GASTRIC BYPASS SURGERY   . Imodium [Loperamide] Palpitations  . Statins     Other reaction(s): Other Joint pain  . Erythromycin Other (See Comments)    UNKNOWN    Current Outpatient Medications  Medication Sig Dispense Refill  . acetaminophen (TYLENOL) 650 MG CR tablet Take 650 mg by mouth as needed for pain.     Marland Kitchen amLODipine (NORVASC) 5 MG tablet Take 5 mg by mouth at bedtime.     . clopidogrel (PLAVIX) 75 MG tablet TAKE 1 TABLET (75 MG TOTAL) BY MOUTH DAILY. PLEASE SCHEDULE APPOINTMENT FOR REFILLS. 7 tablet 0  .  enalapril (VASOTEC) 5 MG tablet Take 5 mg by mouth at bedtime.     . metoCLOPramide (REGLAN) 5 MG tablet Take 5 mg by mouth every 6 (six) hours as needed.     . Multiple Vitamin (MULTIVITAMIN WITH MINERALS) TABS Take 2 tablets by mouth daily.    . pantoprazole (PROTONIX) 40 MG tablet Take 40 mg by mouth daily with breakfast.    . PRESCRIPTION MEDICATION Inject 1 application into the muscle. INJECTION every 21-30 days    . Vitamin D, Ergocalciferol, (DRISDOL) 50000 UNITS CAPS Take 50,000 Units by mouth every Monday, Wednesday, and Friday.      No current facility-administered medications for this visit.    Past Medical History:  Diagnosis Date  . Acute MI ([place]) 11/2004   NONSTEMI 2005 NOINTERVENTION PER NOTE   . Anemia    from bypass- iron and B12  . Anxiety   . Arthritis   . Coronary artery disease   . Depression   . Fibromyalgia   . GERD (gastroesophageal reflux disease)   . GIB (gastrointestinal bleeding)    while on celecoxib post bypass  . Hyperlipidemia   . Hyperlipidemia LDL goal < 130 01/23/2014  . Hypertension   . Obesity    hx of bipass, now resolved.  borderline diabetes also resolved.  . Osteopenia   . Post-menopausal   . SCCA (squamous cell carcinoma) of skin 09/21/2015   left forearm medial cx3,105fu  . SCCA (squamous cell carcinoma) of skin 07/26/2016   bridge of nose cx3 3fu  . SCCA (  squamous cell carcinoma) of skin 02/10/2020   right inner cheek tx after biopsy  . SCCA (squamous cell carcinoma) of skin 02/10/2020   left hand tx after biopsy  . Scoliosis   . Statin intolerance 01/23/2014    Past Surgical History:  Procedure Laterality Date  . CARDIAC CATHETERIZATION     2005 Severely disease small optional diagonal vessel responsible far wall motion abnormalities wise to small and diffusely diseased for consideration of interventio.  Marland Kitchen CATARACT EXTRACTION    . COLONOSCOPY    . GASTRIC BYPASS  01/2004  . KNEE ARTHROSCOPY Right   . LAPAROSCOPIC CHOLECYSTECTOMY   1993  . TOTAL VAGINAL HYSTERECTOMY  1973   secondary to DUB - Dr. Connye Burkitt and Dr. Tamala Julian    Family History  Problem Relation Age of Onset  . Heart failure Mother   . Liver cancer Father   . Cancer Brother   . Heart failure Maternal Grandmother   . Aneurysm Maternal Grandmother        ruptured abdominal aortic aneurysm  . Lung cancer Brother 82    Social History   Socioeconomic History  . Marital status: Married    Spouse name: Clifton James  . Number of children: 3  . Years of education: LPN  . Highest education level: Not on file  Occupational History  . Occupation: Retired  Tobacco Use  . Smoking status: Former Smoker    Packs/day: 0.25    Years: 5.00    Pack years: 1.25    Types: Cigarettes    Start date: 66    Quit date: 12/18/1996    Years since quitting: 23.7  . Smokeless tobacco: Never Used  Substance and Sexual Activity  . Alcohol use: Yes    Alcohol/week: 1.0 - 2.0 standard drink    Types: 1 - 2 Glasses of wine per week    Comment: occasional  wine.    . Drug use: No  . Sexual activity: Not Currently    Birth control/protection: Surgical    Comment: hysterectomy-1973  Other Topics Concern  . Not on file  Social History Narrative   Pt lives at home with family.  Drives and does not use assist device.     Caffeine Use: 2 cups daily         Social Determinants of Health   Financial Resource Strain:   . Difficulty of Paying Living Expenses: Not on file  Food Insecurity:   . Worried About Charity fundraiser in the Last Year: Not on file  . Ran Out of Food in the Last Year: Not on file  Transportation Needs:   . Lack of Transportation (Medical): Not on file  . Lack of Transportation (Non-Medical): Not on file  Physical Activity:   . Days of Exercise per Week: Not on file  . Minutes of Exercise per Session: Not on file  Stress:   . Feeling of Stress : Not on file  Social Connections:   . Frequency of Communication with Friends and Family: Not on file  .  Frequency of Social Gatherings with Friends and Family: Not on file  . Attends Religious Services: Not on file  . Active Member of Clubs or Organizations: Not on file  . Attends Archivist Meetings: Not on file  . Marital Status: Not on file  Intimate Partner Violence:   . Fear of Current or Ex-Partner: Not on file  . Emotionally Abused: Not on file  . Physically Abused: Not on file  .  Sexually Abused: Not on file    Review of systems: The patient specifically denies any chest pain at rest or with exertion, dyspnea at rest or with exertion, orthopnea, paroxysmal nocturnal dyspnea, syncope, palpitations, focal neurological deficits, intermittent claudication, lower extremity edema, unexplained weight gain, cough, hemoptysis or wheezing.  The patient also denies abdominal pain, nausea, vomiting, dysphagia, diarrhea, constipation, polyuria, polydipsia, dysuria, hematuria, frequency, urgency, abnormal bleeding or bruising, fever, chills, unexpected weight changes, mood swings, change in skin or hair texture, change in voice quality, auditory or visual problems, allergic reactions or rashes, new musculoskeletal complaints other than usual "aches and pains".   PHYSICAL EXAM BP 116/72   Pulse 65   Ht 5\' 2"  (1.575 m)   Wt 158 lb 3.2 oz (71.8 kg)   LMP 03/18/1972 (Approximate)   SpO2 99%   BMI 28.94 kg/m    General: Alert, oriented x3, no distress, appears well Head: no evidence of trauma, PERRL, EOMI, no exophtalmos or lid lag, no myxedema, no xanthelasma; normal ears, nose and oropharynx Neck: normal jugular venous pulsations and no hepatojugular reflux; brisk carotid pulses without delay and no carotid bruits Chest: clear to auscultation, no signs of consolidation by percussion or palpation, normal fremitus, symmetrical and full respiratory excursions Cardiovascular: normal position and quality of the apical impulse, regular rhythm, normal first and second heart sounds, 3/6  systolic early peaking ejection murmur in the aortic focus, no diastolic murmurs, rubs or gallops Abdomen: no tenderness or distention, no masses by palpation, no abnormal pulsatility or arterial bruits, normal bowel sounds, no hepatosplenomegaly Extremities: no clubbing, cyanosis or edema; 2+ radial, ulnar and brachial pulses bilaterally; 2+ right femoral, posterior tibial and dorsalis pedis pulses; 2+ left femoral, posterior tibial and dorsalis pedis pulses; no subclavian or femoral bruits Neurological: grossly nonfocal Psych: Normal mood and affect    EKG: Ordered today shows normal sinus rhythm, possible left atrial abnormality, otherwise normal tracing   Lipid Panel on therapy, currently stopped    Component Value Date/Time   CHOL 165 01/04/2014 0235   TRIG 86 01/04/2014 0235   HDL 79 01/04/2014 0235   CHOLHDL 2.1 01/04/2014 0235   VLDL 17 01/04/2014 0235   LDLCALC 69 01/04/2014 0235  02/25/2020 [place] 256, HDL 62, LDL 149, triglycerides 225 Hemoglobin 12.7, creatinine 0.8, TSH 1.63  BMET    Component Value Date/Time   NA 132 (L) 06/13/2015 0254   K 4.8 06/13/2015 0254   CL 100 (L) 06/13/2015 0254   CO2 23 06/13/2015 0254   GLUCOSE 143 (H) 06/13/2015 0254   BUN 14 06/13/2015 0254   CREATININE 0.85 06/13/2015 0254   CALCIUM 9.0 06/13/2015 0254   GFRNONAA >60 06/13/2015 0254   GFRAA >60 06/13/2015 0254     ASSESSMENT AND PLAN:  1. Nonrheumatic aortic valve stenosis     1. CAD: Currently asymptomatic on treatment with amlodipine as her only antianginal on dual antiplatelet therapy and lipid-lowering therapy. 2. AS: Asymptomatic.  Asked her to report exertional dyspnea/angina/syncope.  Suspect it might be several years before her valvular problem becomes an issue.  We will check an echocardiogram next year. 3. HLP: Intolerant to numerous statins and Zetia.  On PCSK9 inhibitor.  Target LDL less than 70.  Labs followed by Dr. Joylene Draft. 4. HTN: Well-controlled. Patient  Instructions  Medication Instructions:  No changes *If you need a refill on your cardiac medications before your next appointment, please call your pharmacy*   Lab Work: None ordered If you have labs (blood work)  drawn today and your tests are completely normal, you will receive your results only by: Marland Kitchen MyChart Message (if you have MyChart) OR . A paper copy in the mail If you have any lab test that is abnormal or we need to change your treatment, we will call you to review the results.   Testing/Procedures: Your physician has requested that you have an echocardiogram in 12 months. Echocardiography is a painless test that uses sound waves to create images of your heart. It provides your doctor with information about the size and shape of your heart and how well your heart's chambers and valves are working. You may receive an ultrasound enhancing agent through an IV if needed to better visualize your heart during the echo.This procedure takes approximately one hour. There are no restrictions for this procedure. This will take place at the [address], [address].     Follow-Up: At [facility], you and your health needs are our priority.  As part of our continuing mission to provide you with exceptional heart care, we have created designated Provider Care Teams.  These Care Teams include your primary Cardiologist (physician) and Advanced Practice Providers (APPs -  Physician Assistants and Nurse Practitioners) who all work together to provide you with the care you need, when you need it.  We recommend signing up for the patient portal called "MyChart".  Sign up information is provided on this After Visit Summary.  MyChart is used to connect with patients for Virtual Visits (Telemedicine).  Patients are able to view lab/test results, encounter notes, upcoming appointments, etc.  Non-urgent messages can be sent to your provider as well.   To learn more about what you can do with MyChart, go  to [email or web address].    Your next appointment:   12 month(s)  The format for your next appointment:   In Person  Provider:   You may see Sanda Klein, MD or one of the following Advanced Practice Providers on your designated Care Team:    Almyra Deforest, PA-C  Fabian Sharp, [place] or   Roby Lofts, PA-C     Orders Placed This Encounter  Procedures  . EKG 12-Lead  . ECHOCARDIOGRAM COMPLETE   No orders of the defined types were placed in this encounter.   Domenik Trice  Sanda Klein, MD, [facility] [phone] office [phone] pager

## 2020-09-16 NOTE — Patient Instructions (Signed)
Medication Instructions:  No changes *If you need a refill on your cardiac medications before your next appointment, please call your pharmacy*   Lab Work: None ordered If you have labs (blood work) drawn today and your tests are completely normal, you will receive your results only by: . MyChart Message (if you have MyChart) OR . A paper copy in the mail If you have any lab test that is abnormal or we need to change your treatment, we will call you to review the results.   Testing/Procedures: Your physician has requested that you have an echocardiogram in 12 months. Echocardiography is a painless test that uses sound waves to create images of your heart. It provides your doctor with information about the size and shape of your heart and how well your heart's chambers and valves are working. You may receive an ultrasound enhancing agent through an IV if needed to better visualize your heart during the echo.This procedure takes approximately one hour. There are no restrictions for this procedure. This will take place at the [address], [address].     Follow-Up: At [facility], you and your health needs are our priority.  As part of our continuing mission to provide you with exceptional heart care, we have created designated Provider Care Teams.  These Care Teams include your primary Cardiologist (physician) and Advanced Practice Providers (APPs -  Physician Assistants and Nurse Practitioners) who all work together to provide you with the care you need, when you need it.  We recommend signing up for the patient portal called "MyChart".  Sign up information is provided on this After Visit Summary.  MyChart is used to connect with patients for Virtual Visits (Telemedicine).  Patients are able to view lab/test results, encounter notes, upcoming appointments, etc.  Non-urgent messages can be sent to your provider as well.   To learn more about what you can do with MyChart, go to  [email or web address].    Your next appointment:   12 month(s)  The format for your next appointment:   In Person  Provider:   You may see Mihai Croitoru, MD or one of the following Advanced Practice Providers on your designated Care Team:    Hao Meng, PA-C  Angela Duke, PA-C or   Krista Kroeger, PA-C   

## 2020-09-17 ENCOUNTER — Ambulatory Visit: Payer: Medicare Other | Admitting: Certified Nurse Midwife

## 2020-09-18 ENCOUNTER — Encounter: Payer: Self-pay | Admitting: Cardiovascular Disease

## 2020-11-13 DIAGNOSIS — R059 Cough, unspecified: Secondary | ICD-10-CM | POA: Diagnosis not present

## 2020-11-16 ENCOUNTER — Telehealth (HOSPITAL_COMMUNITY): Payer: Self-pay | Admitting: Emergency Medicine

## 2020-11-16 ENCOUNTER — Telehealth: Payer: Self-pay | Admitting: Unknown Physician Specialty

## 2020-11-16 NOTE — Telephone Encounter (Signed)
Called pt and explained possible monoclonal antibody treatment. Sx started 11/25. Tested positive 11/27 at [facility] on [address]. Sx include headache, head congestion, no taste or smell, cough, nasal drainage, temperature, body aches, diarrhea, vomiting, and fatigue. Said she is feeling better. Qualifying risk factors include HTN and CAD. Fully vaccinated with Moderna in March 2021. Pt interested in tx. Informed pt an APP will call back to possibly schedule an appointment. Gave pt insurance CPT code [phone] to call insurance company regarding coverage.

## 2020-11-16 NOTE — Telephone Encounter (Signed)
Called to discuss with Kimberly Stone about Covid symptoms and the use of  monoclonal antibody infusion for those with mild to moderate Covid symptoms and at a high risk of hospitalization.     Pt is vaccinated and almost sx free today and decided against treatment  Patient Active Problem List   Diagnosis Date Noted  . URI with cough and congestion 05/01/2016  . UTI (urinary tract infection) 05/01/2016  . TGA (transient global amnesia) 04/30/2016  . Intractable nausea and vomiting 06/09/2015  . Spinal cord inflammation ([place]) 06/09/2015  . Hepatitis 06/09/2015  . Epigastric pain 06/08/2015  . CAD (coronary artery disease) 01/23/2014  . Hyperlipidemia with target LDL less than 70 01/23/2014  . Statin intolerance 01/23/2014  . Atypical chest pain 01/05/2014  . Nausea and vomiting 01/05/2014  . Transaminitis 01/04/2014  . Pancreatic cyst 01/04/2014  . Intrahepatic bile duct dilation 01/04/2014  . 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 Chest pain 01/03/2014  . Anxiety   . Depression   . Hyperlipidemia 08/19/2013  . HTN (hypertension) 08/19/2013  . Meniscus, medial, bucket handle tear, old 09/03/2012  . Osteoarthritis of right knee 09/03/2012

## 2021-02-24 DIAGNOSIS — R82998 Other abnormal findings in urine: Secondary | ICD-10-CM | POA: Diagnosis not present

## 2021-03-03 DIAGNOSIS — E785 Hyperlipidemia, unspecified: Secondary | ICD-10-CM | POA: Diagnosis not present

## 2021-03-03 DIAGNOSIS — M859 Disorder of bone density and structure, unspecified: Secondary | ICD-10-CM | POA: Diagnosis not present

## 2021-03-10 DIAGNOSIS — I10 Essential (primary) hypertension: Secondary | ICD-10-CM | POA: Diagnosis not present

## 2021-03-10 DIAGNOSIS — F419 Anxiety disorder, unspecified: Secondary | ICD-10-CM | POA: Diagnosis not present

## 2021-03-10 DIAGNOSIS — I350 Nonrheumatic aortic (valve) stenosis: Secondary | ICD-10-CM | POA: Diagnosis not present

## 2021-03-10 DIAGNOSIS — E785 Hyperlipidemia, unspecified: Secondary | ICD-10-CM | POA: Diagnosis not present

## 2021-03-10 DIAGNOSIS — E611 Iron deficiency: Secondary | ICD-10-CM | POA: Diagnosis not present

## 2021-03-10 DIAGNOSIS — I519 Heart disease, unspecified: Secondary | ICD-10-CM | POA: Diagnosis not present

## 2021-03-10 DIAGNOSIS — M791 Myalgia, unspecified site: Secondary | ICD-10-CM | POA: Diagnosis not present

## 2021-03-10 DIAGNOSIS — G459 Transient cerebral ischemic attack, unspecified: Secondary | ICD-10-CM | POA: Diagnosis not present

## 2021-03-10 DIAGNOSIS — F329 Major depressive disorder, single episode, unspecified: Secondary | ICD-10-CM | POA: Diagnosis not present

## 2021-03-10 DIAGNOSIS — Z1331 Encounter for screening for depression: Secondary | ICD-10-CM | POA: Diagnosis not present

## 2021-03-10 DIAGNOSIS — E8881 Metabolic syndrome: Secondary | ICD-10-CM | POA: Diagnosis not present

## 2021-03-10 DIAGNOSIS — Z0000 Encounter for general adult medical examination without abnormal findings: Secondary | ICD-10-CM | POA: Diagnosis not present

## 2021-03-10 DIAGNOSIS — Z1212 Encounter for screening for malignant neoplasm of rectum: Secondary | ICD-10-CM | POA: Diagnosis not present

## 2021-03-10 DIAGNOSIS — R82998 Other abnormal findings in urine: Secondary | ICD-10-CM | POA: Diagnosis not present

## 2021-03-10 DIAGNOSIS — I351 Nonrheumatic aortic (valve) insufficiency: Secondary | ICD-10-CM | POA: Diagnosis not present

## 2021-03-10 DIAGNOSIS — D649 Anemia, unspecified: Secondary | ICD-10-CM | POA: Diagnosis not present

## 2021-03-24 ENCOUNTER — Ambulatory Visit: Payer: Medicare Other | Admitting: Obstetrics and Gynecology

## 2021-04-06 ENCOUNTER — Encounter: Payer: Self-pay | Admitting: Internal Medicine

## 2021-04-08 ENCOUNTER — Other Ambulatory Visit (HOSPITAL_COMMUNITY): Payer: Self-pay

## 2021-04-08 DIAGNOSIS — H40013 Open angle with borderline findings, low risk, bilateral: Secondary | ICD-10-CM | POA: Diagnosis not present

## 2021-04-08 DIAGNOSIS — H04123 Dry eye syndrome of bilateral lacrimal glands: Secondary | ICD-10-CM | POA: Diagnosis not present

## 2021-04-08 DIAGNOSIS — H26491 Other secondary cataract, right eye: Secondary | ICD-10-CM | POA: Diagnosis not present

## 2021-04-08 DIAGNOSIS — H3561 Retinal hemorrhage, right eye: Secondary | ICD-10-CM | POA: Diagnosis not present

## 2021-04-08 NOTE — Discharge Instructions (Signed)
Ferumoxytol injection What is this medicine? FERUMOXYTOL is an iron complex. Iron is used to make healthy red blood cells, which carry oxygen and nutrients throughout the body. This medicine is used to treat iron deficiency anemia. This medicine may be used for other purposes; ask your health care provider or pharmacist if you have questions. COMMON BRAND NAME(S): Feraheme What should I tell my health care provider before I take this medicine? They need to know if you have any of these conditions:  anemia not caused by low iron levels  high levels of iron in the blood  magnetic resonance imaging (MRI) test scheduled  an unusual or allergic reaction to iron, other medicines, foods, dyes, or preservatives  pregnant or trying to get pregnant  breast-feeding How should I use this medicine? This medicine is for injection into a vein. It is given by a health care professional in a hospital or clinic setting. Talk to your pediatrician regarding the use of this medicine in children. Special care may be needed. Overdosage: If you think you have taken too much of this medicine contact a poison control center or emergency room at once. NOTE: This medicine is only for you. Do not share this medicine with others. What if I miss a dose? It is important not to miss your dose. Call your doctor or health care professional if you are unable to keep an appointment. What may interact with this medicine? This medicine may interact with the following medications:  other iron products This list may not describe all possible interactions. Give your health care provider a list of all the medicines, herbs, non-prescription drugs, or dietary supplements you use. Also tell them if you smoke, drink alcohol, or use illegal drugs. Some items may interact with your medicine. What should I watch for while using this medicine? Visit your doctor or healthcare professional regularly. Tell your doctor or healthcare  professional if your symptoms do not start to get better or if they get worse. You may need blood work done while you are taking this medicine. You may need to follow a special diet. Talk to your doctor. Foods that contain iron include: whole grains/cereals, dried fruits, beans, or peas, leafy green vegetables, and organ meats (liver, kidney). What side effects may I notice from receiving this medicine? Side effects that you should report to your doctor or health care professional as soon as possible:  allergic reactions like skin rash, itching or hives, swelling of the face, lips, or tongue  breathing problems  changes in blood pressure  feeling faint or lightheaded, falls  fever or chills  flushing, sweating, or hot feelings  swelling of the ankles or feet Side effects that usually do not require medical attention (report to your doctor or health care professional if they continue or are bothersome):  diarrhea  headache  nausea, vomiting  stomach pain This list may not describe all possible side effects. Call your doctor for medical advice about side effects. You may report side effects to FDA at 1-800-FDA-1088. Where should I keep my medicine? This drug is given in a hospital or clinic and will not be stored at home. NOTE: This sheet is a summary. It may not cover all possible information. If you have questions about this medicine, talk to your doctor, pharmacist, or health care provider.  2021 Elsevier/Gold Standard (2017-01-22 20:21:10)  

## 2021-04-11 ENCOUNTER — Other Ambulatory Visit: Payer: Self-pay

## 2021-04-11 ENCOUNTER — Ambulatory Visit (HOSPITAL_COMMUNITY)
Admission: RE | Admit: 2021-04-11 | Discharge: 2021-04-11 | Disposition: A | Discharge status: Home / Self Care | Payer: Medicare Other | Source: Ambulatory Visit | Attending: Internal Medicine | Admitting: Internal Medicine

## 2021-04-11 DIAGNOSIS — D649 Anemia, unspecified: Secondary | ICD-10-CM | POA: Diagnosis not present

## 2021-04-11 MED ORDER — SODIUM CHLORIDE 0.9 % IV SOLN
510.0000 mg | INTRAVENOUS | Status: DC
  Administered 2021-04-11: 510 mg via INTRAVENOUS
  Filled 2021-04-11: qty 510

## 2021-04-18 ENCOUNTER — Ambulatory Visit (HOSPITAL_COMMUNITY)
Admission: RE | Admit: 2021-04-18 | Discharge: 2021-04-18 | Disposition: A | Discharge status: Home / Self Care | Payer: Medicare Other | Source: Ambulatory Visit | Attending: Internal Medicine | Admitting: Internal Medicine

## 2021-04-18 ENCOUNTER — Other Ambulatory Visit: Payer: Self-pay

## 2021-04-18 DIAGNOSIS — D509 Iron deficiency anemia, unspecified: Secondary | ICD-10-CM | POA: Insufficient documentation

## 2021-04-18 MED ORDER — SODIUM CHLORIDE 0.9 % IV SOLN
510.0000 mg | INTRAVENOUS | Status: DC
  Administered 2021-04-18: 510 mg via INTRAVENOUS
  Filled 2021-04-18: qty 510

## 2021-05-13 DIAGNOSIS — H40013 Open angle with borderline findings, low risk, bilateral: Secondary | ICD-10-CM | POA: Diagnosis not present

## 2021-05-13 DIAGNOSIS — H04123 Dry eye syndrome of bilateral lacrimal glands: Secondary | ICD-10-CM | POA: Diagnosis not present

## 2021-05-26 ENCOUNTER — Ambulatory Visit: Payer: Medicare Other | Admitting: Internal Medicine

## 2021-05-27 DIAGNOSIS — Z1231 Encounter for screening mammogram for malignant neoplasm of breast: Secondary | ICD-10-CM | POA: Diagnosis not present

## 2021-06-14 DIAGNOSIS — H40013 Open angle with borderline findings, low risk, bilateral: Secondary | ICD-10-CM | POA: Diagnosis not present

## 2021-06-18 DIAGNOSIS — I10 Essential (primary) hypertension: Secondary | ICD-10-CM | POA: Diagnosis not present

## 2021-06-18 DIAGNOSIS — R109 Unspecified abdominal pain: Secondary | ICD-10-CM | POA: Diagnosis not present

## 2021-06-18 DIAGNOSIS — I252 Old myocardial infarction: Secondary | ICD-10-CM | POA: Diagnosis not present

## 2021-06-18 DIAGNOSIS — Z888 Allergy status to other drugs, medicaments and biological substances status: Secondary | ICD-10-CM | POA: Diagnosis not present

## 2021-06-18 DIAGNOSIS — Z7901 Long term (current) use of anticoagulants: Secondary | ICD-10-CM | POA: Diagnosis not present

## 2021-06-18 DIAGNOSIS — K838 Other specified diseases of biliary tract: Secondary | ICD-10-CM | POA: Diagnosis not present

## 2021-06-18 DIAGNOSIS — K859 Acute pancreatitis without necrosis or infection, unspecified: Secondary | ICD-10-CM | POA: Diagnosis not present

## 2021-06-18 DIAGNOSIS — Z79899 Other long term (current) drug therapy: Secondary | ICD-10-CM | POA: Diagnosis not present

## 2021-06-18 DIAGNOSIS — Z7982 Long term (current) use of aspirin: Secondary | ICD-10-CM | POA: Diagnosis not present

## 2021-06-18 DIAGNOSIS — Z20822 Contact with and (suspected) exposure to covid-19: Secondary | ICD-10-CM | POA: Diagnosis not present

## 2021-06-18 DIAGNOSIS — Z9884 Bariatric surgery status: Secondary | ICD-10-CM | POA: Diagnosis not present

## 2021-06-18 DIAGNOSIS — Z881 Allergy status to other antibiotic agents status: Secondary | ICD-10-CM | POA: Diagnosis not present

## 2021-06-18 DIAGNOSIS — Z87891 Personal history of nicotine dependence: Secondary | ICD-10-CM | POA: Diagnosis not present

## 2021-06-18 DIAGNOSIS — R7401 Elevation of levels of liver transaminase levels: Secondary | ICD-10-CM | POA: Diagnosis not present

## 2021-06-18 DIAGNOSIS — R748 Abnormal levels of other serum enzymes: Secondary | ICD-10-CM | POA: Diagnosis not present

## 2021-06-18 DIAGNOSIS — R1110 Vomiting, unspecified: Secondary | ICD-10-CM | POA: Diagnosis not present

## 2021-06-18 DIAGNOSIS — K760 Fatty (change of) liver, not elsewhere classified: Secondary | ICD-10-CM | POA: Diagnosis not present

## 2021-06-18 DIAGNOSIS — Z9049 Acquired absence of other specified parts of digestive tract: Secondary | ICD-10-CM | POA: Diagnosis not present

## 2021-06-18 DIAGNOSIS — R7989 Other specified abnormal findings of blood chemistry: Secondary | ICD-10-CM | POA: Diagnosis not present

## 2021-06-18 DIAGNOSIS — M797 Fibromyalgia: Secondary | ICD-10-CM | POA: Diagnosis not present

## 2021-06-19 DIAGNOSIS — R7989 Other specified abnormal findings of blood chemistry: Secondary | ICD-10-CM | POA: Diagnosis not present

## 2021-06-19 DIAGNOSIS — R1110 Vomiting, unspecified: Secondary | ICD-10-CM | POA: Diagnosis not present

## 2021-06-19 DIAGNOSIS — K838 Other specified diseases of biliary tract: Secondary | ICD-10-CM | POA: Diagnosis not present

## 2021-06-19 DIAGNOSIS — K760 Fatty (change of) liver, not elsewhere classified: Secondary | ICD-10-CM | POA: Diagnosis not present

## 2021-06-19 DIAGNOSIS — R109 Unspecified abdominal pain: Secondary | ICD-10-CM | POA: Diagnosis not present

## 2021-06-22 DIAGNOSIS — F419 Anxiety disorder, unspecified: Secondary | ICD-10-CM | POA: Diagnosis not present

## 2021-06-22 DIAGNOSIS — K760 Fatty (change of) liver, not elsewhere classified: Secondary | ICD-10-CM | POA: Diagnosis not present

## 2021-06-22 DIAGNOSIS — I10 Essential (primary) hypertension: Secondary | ICD-10-CM | POA: Diagnosis not present

## 2021-06-22 DIAGNOSIS — K858 Other acute pancreatitis without necrosis or infection: Secondary | ICD-10-CM | POA: Diagnosis not present

## 2021-06-22 DIAGNOSIS — D649 Anemia, unspecified: Secondary | ICD-10-CM | POA: Diagnosis not present

## 2021-07-06 DIAGNOSIS — Z9884 Bariatric surgery status: Secondary | ICD-10-CM | POA: Diagnosis not present

## 2021-07-06 DIAGNOSIS — K859 Acute pancreatitis without necrosis or infection, unspecified: Secondary | ICD-10-CM | POA: Diagnosis not present

## 2021-07-06 DIAGNOSIS — R945 Abnormal results of liver function studies: Secondary | ICD-10-CM | POA: Diagnosis not present

## 2021-07-07 DIAGNOSIS — K859 Acute pancreatitis without necrosis or infection, unspecified: Secondary | ICD-10-CM | POA: Diagnosis not present

## 2021-07-07 DIAGNOSIS — N281 Cyst of kidney, acquired: Secondary | ICD-10-CM | POA: Diagnosis not present

## 2021-07-18 DIAGNOSIS — E611 Iron deficiency: Secondary | ICD-10-CM | POA: Diagnosis not present

## 2021-07-18 DIAGNOSIS — N3281 Overactive bladder: Secondary | ICD-10-CM | POA: Diagnosis not present

## 2021-07-18 DIAGNOSIS — D649 Anemia, unspecified: Secondary | ICD-10-CM | POA: Diagnosis not present

## 2021-07-18 DIAGNOSIS — F419 Anxiety disorder, unspecified: Secondary | ICD-10-CM | POA: Diagnosis not present

## 2021-07-18 DIAGNOSIS — K760 Fatty (change of) liver, not elsewhere classified: Secondary | ICD-10-CM | POA: Diagnosis not present

## 2021-07-18 DIAGNOSIS — K219 Gastro-esophageal reflux disease without esophagitis: Secondary | ICD-10-CM | POA: Diagnosis not present

## 2021-07-18 DIAGNOSIS — I10 Essential (primary) hypertension: Secondary | ICD-10-CM | POA: Diagnosis not present

## 2021-07-18 DIAGNOSIS — R112 Nausea with vomiting, unspecified: Secondary | ICD-10-CM | POA: Diagnosis not present

## 2021-07-18 DIAGNOSIS — I351 Nonrheumatic aortic (valve) insufficiency: Secondary | ICD-10-CM | POA: Diagnosis not present

## 2021-07-18 DIAGNOSIS — G459 Transient cerebral ischemic attack, unspecified: Secondary | ICD-10-CM | POA: Diagnosis not present

## 2021-07-18 DIAGNOSIS — K858 Other acute pancreatitis without necrosis or infection: Secondary | ICD-10-CM | POA: Diagnosis not present

## 2021-07-18 DIAGNOSIS — I2510 Atherosclerotic heart disease of native coronary artery without angina pectoris: Secondary | ICD-10-CM | POA: Diagnosis not present

## 2021-08-02 ENCOUNTER — Telehealth: Payer: Self-pay | Admitting: Cardiovascular Disease

## 2021-08-02 NOTE — Telephone Encounter (Signed)
   Name: Kimberly Stone  DOB: Jan 26, 1946  MRN: [id number]   Primary Cardiologist: Sanda Klein, MD  Chart reviewed as part of pre-operative protocol coverage.   Left a voicemail for patient to call back for ongoing preop evaluation.   It appears patient has been on plavix monotherapy for history of CAD not amenable to PCI as seen on cath in 2005 after presenting with an NSTEMI.   Dr. Sallyanne Kuster - any objections to holding plavix prior to upcoming EGD? Please route your response back to P CV DIV PREOP. Thanks!  Abigail Butts, PA-C 08/02/2021, 10:55 AM

## 2021-08-02 NOTE — Telephone Encounter (Signed)
   Name: Kimberly Stone  DOB: Feb 21, 1946  MRN: [id number]   Primary Cardiologist: Sanda Klein, MD  Chart reviewed as part of pre-operative protocol coverage. Patient was contacted 08/02/2021 in reference to pre-operative risk assessment for pending surgery as outlined below.  Kimberly Stone was last seen on 09/16/20 by Dr. Sallyanne Kuster.  Since that day, Kimberly Stone has done fine from a cardiac standpoint. She can complete 4 METS without anginal complaints.  Therefore, based on ACC/AHA guidelines, the patient would be at acceptable risk for the planned procedure without further cardiovascular testing.   The patient was advised that if she develops new symptoms prior to surgery to contact our office to arrange for a follow-up visit, and she verbalized understanding.  Per Dr. Sallyanne Kuster, patient can hold plavix 5 days prior to her upcoming procedure with plans to restart when cleared to do so by her gastroenterologist.   I will route this recommendation to the requesting party via [place] fax function and remove from pre-op pool. Please call with questions.  Abigail Butts, PA-C 08/02/2021, 11:09 AM

## 2021-08-02 NOTE — Telephone Encounter (Signed)
   [facility] Pre-operative Risk Assessment    Patient Name: Kimberly Stone  DOB: 1946/11/09 MRN: [id number]  HEARTCARE STAFF:  - IMPORTANT!!!!!! Under Visit Info/Reason for Call, type in Other and utilize the format Clearance MM/DD/YY or Clearance TBD. Do not use dashes or single digits. - Please review there is not already an duplicate clearance open for this procedure. - If request is for dental extraction, please clarify the # of teeth to be extracted. - If the patient is currently at the dentist's office, call Pre-Op Callback Staff (MA/nurse) to input urgent request.  - If the patient is not currently in the dentist office, please route to the Pre-Op pool.  Request for surgical clearance:  What type of surgery is being performed? Endoscopy  When is this surgery scheduled? 08/10/21  What type of clearance is required (medical clearance vs. Pharmacy clearance to hold med vs. Both)? Both  Are there any medications that need to be held prior to surgery and how long? Plavix for 5 days   Practice name and name of physician performing surgery? Dr. Percell Miller, [facility])   What is the office phone number? [phone]   7.   What is the office fax number? [phone]  8.   Anesthesia type (None, local, MAC, general) ? Propofol    Johnna Acosta 08/02/2021, 8:56 AM  _________________________________________________________________   (provider comments below)

## 2021-08-10 DIAGNOSIS — R634 Abnormal weight loss: Secondary | ICD-10-CM | POA: Diagnosis not present

## 2021-08-10 DIAGNOSIS — Z9884 Bariatric surgery status: Secondary | ICD-10-CM | POA: Diagnosis not present

## 2021-08-31 ENCOUNTER — Other Ambulatory Visit: Payer: Self-pay

## 2021-08-31 ENCOUNTER — Ambulatory Visit (HOSPITAL_COMMUNITY): Payer: Medicare Other | Attending: Cardiovascular Disease

## 2021-08-31 DIAGNOSIS — I350 Nonrheumatic aortic (valve) stenosis: Secondary | ICD-10-CM | POA: Insufficient documentation

## 2021-08-31 LAB — ECHOCARDIOGRAM COMPLETE
AR max vel: 1.66 cm2
AV Area VTI: 1.62 cm2
AV Area mean vel: 1.57 cm2
AV Mean grad: 11 mmHg
AV Peak grad: 20 mmHg
Ao pk vel: 2.24 m/s
Area-P 1/2: 4.31 cm2
P 1/2 time: 513 msec
S' Lateral: 2.7 cm

## 2021-09-16 DIAGNOSIS — H52223 Regular astigmatism, bilateral: Secondary | ICD-10-CM | POA: Diagnosis not present

## 2021-09-16 DIAGNOSIS — H40013 Open angle with borderline findings, low risk, bilateral: Secondary | ICD-10-CM | POA: Diagnosis not present

## 2021-09-16 DIAGNOSIS — H26491 Other secondary cataract, right eye: Secondary | ICD-10-CM | POA: Diagnosis not present

## 2021-09-16 DIAGNOSIS — H3561 Retinal hemorrhage, right eye: Secondary | ICD-10-CM | POA: Diagnosis not present

## 2021-09-16 DIAGNOSIS — H401232 Low-tension glaucoma, bilateral, moderate stage: Secondary | ICD-10-CM | POA: Diagnosis not present

## 2021-09-16 DIAGNOSIS — H04123 Dry eye syndrome of bilateral lacrimal glands: Secondary | ICD-10-CM | POA: Diagnosis not present

## 2021-09-23 ENCOUNTER — Encounter: Payer: Self-pay | Admitting: Cardiovascular Disease

## 2021-09-23 ENCOUNTER — Ambulatory Visit (INDEPENDENT_AMBULATORY_CARE_PROVIDER_SITE_OTHER): Payer: Medicare Other | Admitting: Cardiovascular Disease

## 2021-09-23 ENCOUNTER — Other Ambulatory Visit: Payer: Self-pay

## 2021-09-23 VITALS — BP 124/72 | HR 65 | Ht 62.0 in | Wt 134.2 lb

## 2021-09-23 DIAGNOSIS — E7800 Pure hypercholesterolemia, unspecified: Secondary | ICD-10-CM

## 2021-09-23 DIAGNOSIS — I350 Nonrheumatic aortic (valve) stenosis: Secondary | ICD-10-CM

## 2021-09-23 DIAGNOSIS — R0989 Other specified symptoms and signs involving the circulatory and respiratory systems: Secondary | ICD-10-CM

## 2021-09-23 DIAGNOSIS — I10 Essential (primary) hypertension: Secondary | ICD-10-CM

## 2021-09-23 DIAGNOSIS — I2510 Atherosclerotic heart disease of native coronary artery without angina pectoris: Secondary | ICD-10-CM

## 2021-09-23 NOTE — Progress Notes (Signed)
Patient ID: AUBURN HESTER, female   DOB: [birth year], 75 y.o.   MRN: [id number]      Reason for office visit CAD, hyperlipidemia, statin intolerance  The patient is a 75 y.o. year-old female with history of CAD (NSTEMI 2005 found to have severe disease in ramus intermedius too diffusely diseased to allow PCI), mild calcific aortic stenosis (mean gradient 14 mmHg in February 2020), HTN, HLD, GERD, previous obesity and pre-diabetes s/p gastric bypass surgery.  Generally feels well but does not engage in any routine physical activity.  She "walks over to the neighbors house, most days".  She does not do the yard work.  She takes care of most of the household chores but has some help with that.  With activities of daily living, she has no cardiovascular complaints.  The patient specifically denies any chest pain at rest exertion, dyspnea at rest or with exertion, orthopnea, paroxysmal nocturnal dyspnea, syncope, palpitations, focal neurological deficits, intermittent claudication, lower extremity edema, unexplained weight gain, cough, hemoptysis or wheezing.  Despite having had a previous cholecystectomy, she developed obstructive common bile duct stones and acute pancreatitis in July of this year.  She has recovered well from that and passed the stone spontaneously.  She has significant hyperlipidemia and since 2006 has tried treatment with simvastatin, atorvastatin, rosuvastatin, Livalo and Zetia but was intolerant to all of these secondary to liver abnormalities and/or myalgia. She received Repatha, but then had to switch to Praluent due to insurance requirements.  Her most recent lipid profile from February 25, 2020 showed an LDL cholesterol 149 and a total cholesterol 256.   Allergies  Allergen Reactions   Aspirin Other (See Comments)    PT CANNOT TAKE DUE TO HX OF GASTRIC BYPASS SURGERY    Imodium [Loperamide] Palpitations   Statins     Other reaction(s): Other Joint pain   Erythromycin Other  (See Comments)    UNKNOWN    Current Outpatient Medications  Medication Sig Dispense Refill   acetaminophen (TYLENOL) 650 MG CR tablet Take 650 mg by mouth as needed for pain.      amLODipine (NORVASC) 5 MG tablet Take 5 mg by mouth at bedtime.      clopidogrel (PLAVIX) 75 MG tablet TAKE 1 TABLET (75 MG TOTAL) BY MOUTH DAILY. PLEASE SCHEDULE APPOINTMENT FOR REFILLS. 7 tablet 0   enalapril (VASOTEC) 5 MG tablet Take 5 mg by mouth at bedtime.      metoCLOPramide (REGLAN) 5 MG tablet Take 5 mg by mouth every 6 (six) hours as needed.      Multiple Vitamin (MULTIVITAMIN WITH MINERALS) TABS Take 2 tablets by mouth daily.     pantoprazole (PROTONIX) 40 MG tablet Take 40 mg by mouth daily with breakfast.     PRESCRIPTION MEDICATION Inject 1 application into the muscle. INJECTION every 21-30 days     Vitamin D, Ergocalciferol, (DRISDOL) 50000 UNITS CAPS Take 50,000 Units by mouth every Monday, Wednesday, and Friday.      No current facility-administered medications for this visit.    Past Medical History:  Diagnosis Date   Acute MI ([place]) 11/2004   NONSTEMI 2005 NOINTERVENTION PER NOTE    Anemia    from bypass- iron and B12   Anxiety    Arthritis    Coronary artery disease    DDD (degenerative disc disease), cervical    Depression    DJD (degenerative joint disease)    Fibromyalgia    GERD (gastroesophageal reflux disease)    GIB (  gastrointestinal bleeding)    while on celecoxib post bypass   Hyperlipidemia    Hyperlipidemia LDL goal < 130 01/23/2014   Hypertension    Obesity    hx of bipass, now resolved.  borderline diabetes also resolved.   Osteopenia    Post-menopausal    SCCA (squamous cell carcinoma) of skin 09/21/2015   left forearm medial cx3,73fu   SCCA (squamous cell carcinoma) of skin 07/26/2016   bridge of nose cx3 52fu   SCCA (squamous cell carcinoma) of skin 02/10/2020   right inner cheek tx after biopsy   SCCA (squamous cell carcinoma) of skin 02/10/2020   left hand  tx after biopsy   Scoliosis    Sinusitis    Statin intolerance 01/23/2014    Past Surgical History:  Procedure Laterality Date   CARDIAC CATHETERIZATION     2005 Severely disease small optional diagonal vessel responsible far wall motion abnormalities wise to small and diffusely diseased for consideration of interventio.   CATARACT EXTRACTION     COLONOSCOPY     GASTRIC BYPASS  01/2004   KNEE ARTHROSCOPY Right    LAPAROSCOPIC CHOLECYSTECTOMY  1993   TOTAL VAGINAL HYSTERECTOMY  1973   secondary to DUB - Dr. Connye Burkitt and Dr. Tamala Julian    Family History  Problem Relation Age of Onset   Heart failure Mother    Liver cancer Father    Cancer Brother    Heart failure Maternal Grandmother    Aneurysm Maternal Grandmother        ruptured abdominal aortic aneurysm   Lung cancer Brother 29    Social History   Socioeconomic History   Marital status: Married    Spouse name: Clifton James   Number of children: 3   Years of education: LPN   Highest education level: Not on file  Occupational History   Occupation: Retired  Tobacco Use   Smoking status: Former    Packs/day: 0.25    Years: 5.00    Pack years: 1.25    Types: Cigarettes    Start date: 1993    Quit date: 12/18/1996    Years since quitting: 24.7   Smokeless tobacco: Never  Substance and Sexual Activity   Alcohol use: Yes    Alcohol/week: 1.0 - 2.0 standard drink    Types: 1 - 2 Glasses of wine per week    Comment: occasional  wine.     Drug use: No   Sexual activity: Not Currently    Birth control/protection: Surgical    Comment: hysterectomy-1973  Other Topics Concern   Not on file  Social History Narrative   Pt lives at home with family.  Drives and does not use assist device.     Caffeine Use: 2 cups daily         Social Determinants of Health   Financial Resource Strain: Not on file  Food Insecurity: Not on file  Transportation Needs: Not on file  Physical Activity: Not on file  Stress: Not on file  Social  Connections: Not on file  Intimate Partner Violence: Not on file    Review of systems: Please see history of present illness.  Denies other complaints.   PHYSICAL EXAM BP 124/72   Pulse 65   Ht 5\' 2"  (1.575 m)   Wt 60.9 kg   LMP 03/18/1972 (Approximate)   SpO2 98%   BMI 24.55 kg/m    General: Alert, oriented x3, no distress, appears well Head: no evidence of trauma, PERRL, EOMI, no  exophtalmos or lid lag, no myxedema, no xanthelasma; normal ears, nose and oropharynx Neck: normal jugular venous pulsations and no hepatojugular reflux; brisk carotid pulses without delay.  There are loud bilateral carotid bruits, particularly loud on the left. Chest: clear to auscultation, no signs of consolidation by percussion or palpation, normal fremitus, symmetrical and full respiratory excursions Cardiovascular: normal position and quality of the apical impulse, regular rhythm, normal first and second heart sounds.the second heart sound is distinct.  There is a 3/6 early peaking systolic ejection murmur heard throughout the precordium, no diastolic murmurs, rubs or gallops Abdomen: no tenderness or distention, no masses by palpation, no abnormal pulsatility or arterial bruits, normal bowel sounds, no hepatosplenomegaly Extremities: no clubbing, cyanosis or edema; 2+ radial, ulnar and brachial pulses bilaterally; 2+ right femoral, posterior tibial and dorsalis pedis pulses; 2+ left femoral, posterior tibial and dorsalis pedis pulses; no subclavian or femoral bruits Neurological: grossly nonfocal Psych: Normal mood and affect  Echocardiogram 08/31/2021:   1. Left ventricular ejection fraction, by estimation, is 60 to 65%. The  left ventricle has normal function. The left ventricle has no regional  wall motion abnormalities. There is mild asymmetric left ventricular  hypertrophy. Left ventricular diastolic  parameters were normal.   2. Right ventricular systolic function is moderately reduced. The  right  ventricular size is normal.   3. The mitral valve is normal in structure. Mild mitral valve  regurgitation.   4. The aortic valve is grossly normal. Aortic valve regurgitation is  mild. Mild aortic valve stenosis.   Comparison(s): 01/30/19 EF 55-60%. Mild AS 2mmHg mean PG, 23mmHg peak PG.   RIGHT VENTRICLE  RV Basal diam:  2.60 cm  RV S prime:     7.48 cm/s  TAPSE (M-mode): 1.3 cm   AORTIC VALVE  AV Area (Vmax):    1.66 cm  AV Area (Vmean):   1.57 cm  AV Area (VTI):     1.62 cm  AV Vmax:           223.50 cm/s  AV Vmean:          157.000 cm/s  AV VTI:            0.460 m  AV Peak Grad:      20.0 mmHg  AV Mean Grad:      11.0 mmHg  LVOT Vmax:         107.00 cm/s  LVOT Vmean:        71.300 cm/s  LVOT VTI:          0.215 m  LVOT/AV VTI ratio: 0.47  AI PHT:            513 msec   EKG: Ordered today and personally reviewed, shows normal sinus rhythm, possible left atrial abnormality, otherwise completely normal  Lipid Panel on therapy, currently stopped    Component Value Date/Time   CHOL 165 01/04/2014 0235   TRIG 86 01/04/2014 0235   HDL 79 01/04/2014 0235   CHOLHDL 2.1 01/04/2014 0235   VLDL 17 01/04/2014 0235   LDLCALC 69 01/04/2014 0235  02/25/2020 [place] 256, HDL 62, LDL 149, triglycerides 225 Hemoglobin 12.7, creatinine 0.8, TSH 1.63  03/03/2021 Cholesterol 153, HDL 63, LDL 61, triglycerides 144 Hemoglobin 13.6, potassium 4.4  BMET    Component Value Date/Time   NA 132 (L) 06/13/2015 0254   K 4.8 06/13/2015 0254   CL 100 (L) 06/13/2015 0254   CO2 23 06/13/2015 0254   GLUCOSE 143 (H) 06/13/2015  0254   BUN 14 06/13/2015 0254   CREATININE 0.85 06/13/2015 0254   CALCIUM 9.0 06/13/2015 0254   GFRNONAA >60 06/13/2015 0254   GFRAA >60 06/13/2015 0254     ASSESSMENT AND PLAN:  1. Coronary artery disease involving native coronary artery of native heart without angina pectoris   2. Nonrheumatic aortic valve stenosis   3. Hypercholesterolemia   4.  Essential hypertension   5. Bilateral carotid bruits      1. CAD: Asymptomatic.  On platelet inhibitors and lipid-lowering agents. 2. AS: Asymptomatic.  No progression on recent echo.  Encouraged her to perform regular physical exercise and promptly report exertional dyspnea/angina/syncope. 3. HLP: Excellent LDL cholesterol PCSK9 inhibitor. 4. HTN: Excellent control. 5.  Bilateral carotid bruits: Check Doppler ultrasound.  Suspect these may be radiating from the chest, but they sound remarkably intense and the left side is substantially louder than the right.  Patient Instructions  Medication Instructions:  No changes *If you need a refill on your cardiac medications before your next appointment, please call your pharmacy*   Lab Work: None ordered If you have labs (blood work) drawn today and your tests are completely normal, you will receive your results only by: [place] (if you have MyChart) OR A paper copy in the mail If you have any lab test that is abnormal or we need to change your treatment, we will call you to review the results.   Testing/Procedures: Your physician has requested that you have a carotid duplex. This test is an ultrasound of the carotid arteries in your neck. It looks at blood flow through these arteries that supply the brain with blood. Allow one hour for this exam. There are no restrictions or special instructions. This will take place at [place], [address].  Follow-Up: At [facility], you and your health needs are our priority.  As part of our continuing mission to provide you with exceptional heart care, we have created designated Provider Care Teams.  These Care Teams include your primary Cardiologist (physician) and Advanced Practice Providers (APPs -  Physician Assistants and Nurse Practitioners) who all work together to provide you with the care you need, when you need it.  We recommend signing up for the patient portal called  "MyChart".  Sign up information is provided on this After Visit Summary.  MyChart is used to connect with patients for Virtual Visits (Telemedicine).  Patients are able to view lab/test results, encounter notes, upcoming appointments, etc.  Non-urgent messages can be sent to your provider as well.   To learn more about what you can do with MyChart, go to [email or web address].    Your next appointment:   12 month(s)  The format for your next appointment:   In Person  Provider:   You may see Sanda Klein, MD or one of the following Advanced Practice Providers on your designated Care Team:   Almyra Deforest, PA-C Fabian Sharp, [place] or  Roby Lofts, PA-C   Orders Placed This Encounter  Procedures   EKG 12-Lead   VAS US CAROTID    No orders of the defined types were placed in this encounter.   Talyn Dessert  Sanda Klein, MD, [facility] [phone] office [phone] pager

## 2021-09-23 NOTE — Patient Instructions (Signed)
Medication Instructions:  No changes *If you need a refill on your cardiac medications before your next appointment, please call your pharmacy*   Lab Work: None ordered If you have labs (blood work) drawn today and your tests are completely normal, you will receive your results only by: [place] (if you have MyChart) OR A paper copy in the mail If you have any lab test that is abnormal or we need to change your treatment, we will call you to review the results.   Testing/Procedures: Your physician has requested that you have a carotid duplex. This test is an ultrasound of the carotid arteries in your neck. It looks at blood flow through these arteries that supply the brain with blood. Allow one hour for this exam. There are no restrictions or special instructions. This will take place at [place], [address].  Follow-Up: At [facility], you and your health needs are our priority.  As part of our continuing mission to provide you with exceptional heart care, we have created designated Provider Care Teams.  These Care Teams include your primary Cardiologist (physician) and Advanced Practice Providers (APPs -  Physician Assistants and Nurse Practitioners) who all work together to provide you with the care you need, when you need it.  We recommend signing up for the patient portal called "MyChart".  Sign up information is provided on this After Visit Summary.  MyChart is used to connect with patients for Virtual Visits (Telemedicine).  Patients are able to view lab/test results, encounter notes, upcoming appointments, etc.  Non-urgent messages can be sent to your provider as well.   To learn more about what you can do with MyChart, go to [email or web address].    Your next appointment:   12 month(s)  The format for your next appointment:   In Person  Provider:   You may see Sanda Klein, MD or one of the following Advanced Practice Providers on your designated Care  Team:   Almyra Deforest, PA-C Fabian Sharp, PA-C or  Roby Lofts, [place]

## 2021-10-04 ENCOUNTER — Ambulatory Visit (HOSPITAL_COMMUNITY)
Admission: RE | Admit: 2021-10-04 | Payer: Medicare Other | Source: Ambulatory Visit | Attending: Cardiovascular Disease | Admitting: Cardiovascular Disease

## 2021-10-06 ENCOUNTER — Ambulatory Visit (HOSPITAL_COMMUNITY)
Admission: RE | Admit: 2021-10-06 | Discharge: 2021-10-06 | Disposition: A | Discharge status: Home / Self Care | Payer: Medicare Other | Source: Ambulatory Visit | Attending: Cardiology | Admitting: Cardiology

## 2021-10-06 ENCOUNTER — Other Ambulatory Visit: Payer: Self-pay

## 2021-10-06 DIAGNOSIS — R0989 Other specified symptoms and signs involving the circulatory and respiratory systems: Secondary | ICD-10-CM | POA: Insufficient documentation

## 2021-10-27 DIAGNOSIS — W268XXA Contact with other sharp object(s), not elsewhere classified, initial encounter: Secondary | ICD-10-CM | POA: Diagnosis not present

## 2021-10-27 DIAGNOSIS — Z7901 Long term (current) use of anticoagulants: Secondary | ICD-10-CM | POA: Diagnosis not present

## 2021-10-27 DIAGNOSIS — S61218A Laceration without foreign body of other finger without damage to nail, initial encounter: Secondary | ICD-10-CM | POA: Diagnosis not present

## 2021-10-27 DIAGNOSIS — S61210A Laceration without foreign body of right index finger without damage to nail, initial encounter: Secondary | ICD-10-CM | POA: Diagnosis not present

## 2021-10-29 DIAGNOSIS — S61210A Laceration without foreign body of right index finger without damage to nail, initial encounter: Secondary | ICD-10-CM | POA: Diagnosis not present

## 2021-10-29 DIAGNOSIS — S61219A Laceration without foreign body of unspecified finger without damage to nail, initial encounter: Secondary | ICD-10-CM | POA: Diagnosis not present

## 2021-10-29 DIAGNOSIS — W312XXA Contact with powered woodworking and forming machines, initial encounter: Secondary | ICD-10-CM | POA: Diagnosis not present

## 2021-11-17 DIAGNOSIS — M5411 Radiculopathy, occipito-atlanto-axial region: Secondary | ICD-10-CM | POA: Diagnosis not present

## 2021-11-17 DIAGNOSIS — M9901 Segmental and somatic dysfunction of cervical region: Secondary | ICD-10-CM | POA: Diagnosis not present

## 2021-11-22 DIAGNOSIS — M9901 Segmental and somatic dysfunction of cervical region: Secondary | ICD-10-CM | POA: Diagnosis not present

## 2021-11-22 DIAGNOSIS — M5411 Radiculopathy, occipito-atlanto-axial region: Secondary | ICD-10-CM | POA: Diagnosis not present

## 2021-11-23 DIAGNOSIS — K760 Fatty (change of) liver, not elsewhere classified: Secondary | ICD-10-CM | POA: Diagnosis not present

## 2021-11-23 DIAGNOSIS — D649 Anemia, unspecified: Secondary | ICD-10-CM | POA: Diagnosis not present

## 2021-11-23 DIAGNOSIS — N3281 Overactive bladder: Secondary | ICD-10-CM | POA: Diagnosis not present

## 2021-11-23 DIAGNOSIS — E611 Iron deficiency: Secondary | ICD-10-CM | POA: Diagnosis not present

## 2021-11-23 DIAGNOSIS — I2510 Atherosclerotic heart disease of native coronary artery without angina pectoris: Secondary | ICD-10-CM | POA: Diagnosis not present

## 2021-11-23 DIAGNOSIS — I350 Nonrheumatic aortic (valve) stenosis: Secondary | ICD-10-CM | POA: Diagnosis not present

## 2021-11-23 DIAGNOSIS — F419 Anxiety disorder, unspecified: Secondary | ICD-10-CM | POA: Diagnosis not present

## 2021-11-23 DIAGNOSIS — I351 Nonrheumatic aortic (valve) insufficiency: Secondary | ICD-10-CM | POA: Diagnosis not present

## 2021-11-23 DIAGNOSIS — I10 Essential (primary) hypertension: Secondary | ICD-10-CM | POA: Diagnosis not present

## 2021-12-06 DIAGNOSIS — M5411 Radiculopathy, occipito-atlanto-axial region: Secondary | ICD-10-CM | POA: Diagnosis not present

## 2021-12-06 DIAGNOSIS — M9901 Segmental and somatic dysfunction of cervical region: Secondary | ICD-10-CM | POA: Diagnosis not present

## 2022-01-17 DIAGNOSIS — H401232 Low-tension glaucoma, bilateral, moderate stage: Secondary | ICD-10-CM | POA: Diagnosis not present

## 2022-01-31 DIAGNOSIS — N390 Urinary tract infection, site not specified: Secondary | ICD-10-CM | POA: Diagnosis not present

## 2022-02-14 DIAGNOSIS — I10 Essential (primary) hypertension: Secondary | ICD-10-CM | POA: Diagnosis not present

## 2022-02-14 DIAGNOSIS — E785 Hyperlipidemia, unspecified: Secondary | ICD-10-CM | POA: Diagnosis not present

## 2022-02-14 DIAGNOSIS — I2510 Atherosclerotic heart disease of native coronary artery without angina pectoris: Secondary | ICD-10-CM | POA: Diagnosis not present

## 2022-02-14 DIAGNOSIS — M1990 Unspecified osteoarthritis, unspecified site: Secondary | ICD-10-CM | POA: Diagnosis not present

## 2022-02-21 DIAGNOSIS — R051 Acute cough: Secondary | ICD-10-CM | POA: Diagnosis not present

## 2022-02-21 DIAGNOSIS — R5383 Other fatigue: Secondary | ICD-10-CM | POA: Diagnosis not present

## 2022-02-21 DIAGNOSIS — I10 Essential (primary) hypertension: Secondary | ICD-10-CM | POA: Diagnosis not present

## 2022-02-21 DIAGNOSIS — Z1152 Encounter for screening for COVID-19: Secondary | ICD-10-CM | POA: Diagnosis not present

## 2022-02-21 DIAGNOSIS — J09X2 Influenza due to identified novel influenza A virus with other respiratory manifestations: Secondary | ICD-10-CM | POA: Diagnosis not present

## 2022-02-21 DIAGNOSIS — R61 Generalized hyperhidrosis: Secondary | ICD-10-CM | POA: Diagnosis not present

## 2022-02-21 DIAGNOSIS — R6883 Chills (without fever): Secondary | ICD-10-CM | POA: Diagnosis not present

## 2022-02-28 DIAGNOSIS — H401232 Low-tension glaucoma, bilateral, moderate stage: Secondary | ICD-10-CM | POA: Diagnosis not present

## 2022-02-28 DIAGNOSIS — H40013 Open angle with borderline findings, low risk, bilateral: Secondary | ICD-10-CM | POA: Diagnosis not present

## 2022-03-30 DIAGNOSIS — I10 Essential (primary) hypertension: Secondary | ICD-10-CM | POA: Diagnosis not present

## 2022-03-30 DIAGNOSIS — Z79899 Other long term (current) drug therapy: Secondary | ICD-10-CM | POA: Diagnosis not present

## 2022-03-30 DIAGNOSIS — E785 Hyperlipidemia, unspecified: Secondary | ICD-10-CM | POA: Diagnosis not present

## 2022-03-30 DIAGNOSIS — M859 Disorder of bone density and structure, unspecified: Secondary | ICD-10-CM | POA: Diagnosis not present

## 2022-04-04 DIAGNOSIS — R41840 Attention and concentration deficit: Secondary | ICD-10-CM | POA: Diagnosis not present

## 2022-04-04 DIAGNOSIS — R5383 Other fatigue: Secondary | ICD-10-CM | POA: Diagnosis not present

## 2022-04-04 DIAGNOSIS — G4701 Insomnia due to medical condition: Secondary | ICD-10-CM | POA: Diagnosis not present

## 2022-04-05 DIAGNOSIS — R945 Abnormal results of liver function studies: Secondary | ICD-10-CM | POA: Diagnosis not present

## 2022-04-05 DIAGNOSIS — I350 Nonrheumatic aortic (valve) stenosis: Secondary | ICD-10-CM | POA: Diagnosis not present

## 2022-04-05 DIAGNOSIS — I2510 Atherosclerotic heart disease of native coronary artery without angina pectoris: Secondary | ICD-10-CM | POA: Diagnosis not present

## 2022-04-05 DIAGNOSIS — R82998 Other abnormal findings in urine: Secondary | ICD-10-CM | POA: Diagnosis not present

## 2022-04-05 DIAGNOSIS — H6120 Impacted cerumen, unspecified ear: Secondary | ICD-10-CM | POA: Diagnosis not present

## 2022-04-05 DIAGNOSIS — G4700 Insomnia, unspecified: Secondary | ICD-10-CM | POA: Diagnosis not present

## 2022-04-05 DIAGNOSIS — M1990 Unspecified osteoarthritis, unspecified site: Secondary | ICD-10-CM | POA: Diagnosis not present

## 2022-04-05 DIAGNOSIS — I351 Nonrheumatic aortic (valve) insufficiency: Secondary | ICD-10-CM | POA: Diagnosis not present

## 2022-04-05 DIAGNOSIS — I10 Essential (primary) hypertension: Secondary | ICD-10-CM | POA: Diagnosis not present

## 2022-04-05 DIAGNOSIS — E785 Hyperlipidemia, unspecified: Secondary | ICD-10-CM | POA: Diagnosis not present

## 2022-04-05 DIAGNOSIS — Z0000 Encounter for general adult medical examination without abnormal findings: Secondary | ICD-10-CM | POA: Diagnosis not present

## 2022-04-05 DIAGNOSIS — G459 Transient cerebral ischemic attack, unspecified: Secondary | ICD-10-CM | POA: Diagnosis not present

## 2022-04-05 DIAGNOSIS — Z23 Encounter for immunization: Secondary | ICD-10-CM | POA: Diagnosis not present

## 2022-04-05 DIAGNOSIS — M8580 Other specified disorders of bone density and structure, unspecified site: Secondary | ICD-10-CM | POA: Diagnosis not present

## 2022-04-05 DIAGNOSIS — Z1389 Encounter for screening for other disorder: Secondary | ICD-10-CM | POA: Diagnosis not present

## 2022-04-05 DIAGNOSIS — Z1212 Encounter for screening for malignant neoplasm of rectum: Secondary | ICD-10-CM | POA: Diagnosis not present

## 2022-04-05 DIAGNOSIS — Z1331 Encounter for screening for depression: Secondary | ICD-10-CM | POA: Diagnosis not present

## 2022-04-13 DIAGNOSIS — R41840 Attention and concentration deficit: Secondary | ICD-10-CM | POA: Diagnosis not present

## 2022-04-13 DIAGNOSIS — R400 Somnolence: Secondary | ICD-10-CM | POA: Diagnosis not present

## 2022-05-18 DIAGNOSIS — I10 Essential (primary) hypertension: Secondary | ICD-10-CM | POA: Diagnosis not present

## 2022-05-18 DIAGNOSIS — R5383 Other fatigue: Secondary | ICD-10-CM | POA: Diagnosis not present

## 2022-05-18 DIAGNOSIS — R400 Somnolence: Secondary | ICD-10-CM | POA: Diagnosis not present

## 2022-05-18 DIAGNOSIS — F4323 Adjustment disorder with mixed anxiety and depressed mood: Secondary | ICD-10-CM | POA: Diagnosis not present

## 2022-05-18 DIAGNOSIS — R41840 Attention and concentration deficit: Secondary | ICD-10-CM | POA: Diagnosis not present

## 2022-05-19 DIAGNOSIS — R400 Somnolence: Secondary | ICD-10-CM | POA: Diagnosis not present

## 2022-05-19 DIAGNOSIS — R5383 Other fatigue: Secondary | ICD-10-CM | POA: Diagnosis not present

## 2022-05-30 DIAGNOSIS — M5411 Radiculopathy, occipito-atlanto-axial region: Secondary | ICD-10-CM | POA: Diagnosis not present

## 2022-05-30 DIAGNOSIS — M9901 Segmental and somatic dysfunction of cervical region: Secondary | ICD-10-CM | POA: Diagnosis not present

## 2022-06-01 DIAGNOSIS — M5411 Radiculopathy, occipito-atlanto-axial region: Secondary | ICD-10-CM | POA: Diagnosis not present

## 2022-06-01 DIAGNOSIS — M9901 Segmental and somatic dysfunction of cervical region: Secondary | ICD-10-CM | POA: Diagnosis not present

## 2022-06-01 DIAGNOSIS — R5383 Other fatigue: Secondary | ICD-10-CM | POA: Diagnosis not present

## 2022-06-01 DIAGNOSIS — R400 Somnolence: Secondary | ICD-10-CM | POA: Diagnosis not present

## 2022-06-05 DIAGNOSIS — M9901 Segmental and somatic dysfunction of cervical region: Secondary | ICD-10-CM | POA: Diagnosis not present

## 2022-06-05 DIAGNOSIS — M5411 Radiculopathy, occipito-atlanto-axial region: Secondary | ICD-10-CM | POA: Diagnosis not present

## 2022-06-06 DIAGNOSIS — M5411 Radiculopathy, occipito-atlanto-axial region: Secondary | ICD-10-CM | POA: Diagnosis not present

## 2022-06-06 DIAGNOSIS — M9901 Segmental and somatic dysfunction of cervical region: Secondary | ICD-10-CM | POA: Diagnosis not present

## 2022-06-15 DIAGNOSIS — M9901 Segmental and somatic dysfunction of cervical region: Secondary | ICD-10-CM | POA: Diagnosis not present

## 2022-06-15 DIAGNOSIS — M5411 Radiculopathy, occipito-atlanto-axial region: Secondary | ICD-10-CM | POA: Diagnosis not present

## 2022-06-30 DIAGNOSIS — N390 Urinary tract infection, site not specified: Secondary | ICD-10-CM | POA: Diagnosis not present

## 2022-07-03 DIAGNOSIS — M9901 Segmental and somatic dysfunction of cervical region: Secondary | ICD-10-CM | POA: Diagnosis not present

## 2022-07-03 DIAGNOSIS — M5411 Radiculopathy, occipito-atlanto-axial region: Secondary | ICD-10-CM | POA: Diagnosis not present

## 2022-07-04 DIAGNOSIS — M9901 Segmental and somatic dysfunction of cervical region: Secondary | ICD-10-CM | POA: Diagnosis not present

## 2022-07-04 DIAGNOSIS — M5411 Radiculopathy, occipito-atlanto-axial region: Secondary | ICD-10-CM | POA: Diagnosis not present

## 2022-07-05 DIAGNOSIS — R42 Dizziness and giddiness: Secondary | ICD-10-CM | POA: Diagnosis not present

## 2022-07-05 DIAGNOSIS — I10 Essential (primary) hypertension: Secondary | ICD-10-CM | POA: Diagnosis not present

## 2022-07-05 DIAGNOSIS — I350 Nonrheumatic aortic (valve) stenosis: Secondary | ICD-10-CM | POA: Diagnosis not present

## 2022-07-05 DIAGNOSIS — H8110 Benign paroxysmal vertigo, unspecified ear: Secondary | ICD-10-CM | POA: Diagnosis not present

## 2022-07-11 DIAGNOSIS — H04123 Dry eye syndrome of bilateral lacrimal glands: Secondary | ICD-10-CM | POA: Diagnosis not present

## 2022-07-11 DIAGNOSIS — H401232 Low-tension glaucoma, bilateral, moderate stage: Secondary | ICD-10-CM | POA: Diagnosis not present

## 2022-07-17 DIAGNOSIS — I351 Nonrheumatic aortic (valve) insufficiency: Secondary | ICD-10-CM | POA: Diagnosis not present

## 2022-07-17 DIAGNOSIS — I2510 Atherosclerotic heart disease of native coronary artery without angina pectoris: Secondary | ICD-10-CM | POA: Diagnosis not present

## 2022-07-17 DIAGNOSIS — H8110 Benign paroxysmal vertigo, unspecified ear: Secondary | ICD-10-CM | POA: Diagnosis not present

## 2022-07-17 DIAGNOSIS — I10 Essential (primary) hypertension: Secondary | ICD-10-CM | POA: Diagnosis not present

## 2022-07-17 DIAGNOSIS — R945 Abnormal results of liver function studies: Secondary | ICD-10-CM | POA: Diagnosis not present

## 2022-07-17 DIAGNOSIS — G459 Transient cerebral ischemic attack, unspecified: Secondary | ICD-10-CM | POA: Diagnosis not present

## 2022-07-17 DIAGNOSIS — E785 Hyperlipidemia, unspecified: Secondary | ICD-10-CM | POA: Diagnosis not present

## 2022-07-17 DIAGNOSIS — F329 Major depressive disorder, single episode, unspecified: Secondary | ICD-10-CM | POA: Diagnosis not present

## 2022-07-17 DIAGNOSIS — I350 Nonrheumatic aortic (valve) stenosis: Secondary | ICD-10-CM | POA: Diagnosis not present

## 2022-07-17 DIAGNOSIS — R42 Dizziness and giddiness: Secondary | ICD-10-CM | POA: Diagnosis not present

## 2022-10-24 DIAGNOSIS — M546 Pain in thoracic spine: Secondary | ICD-10-CM | POA: Diagnosis not present

## 2022-11-01 DIAGNOSIS — D649 Anemia, unspecified: Secondary | ICD-10-CM | POA: Diagnosis not present

## 2022-11-01 DIAGNOSIS — I519 Heart disease, unspecified: Secondary | ICD-10-CM | POA: Diagnosis not present

## 2022-11-01 DIAGNOSIS — R42 Dizziness and giddiness: Secondary | ICD-10-CM | POA: Diagnosis not present

## 2022-11-01 DIAGNOSIS — E785 Hyperlipidemia, unspecified: Secondary | ICD-10-CM | POA: Diagnosis not present

## 2022-11-01 DIAGNOSIS — I351 Nonrheumatic aortic (valve) insufficiency: Secondary | ICD-10-CM | POA: Diagnosis not present

## 2022-11-01 DIAGNOSIS — G459 Transient cerebral ischemic attack, unspecified: Secondary | ICD-10-CM | POA: Diagnosis not present

## 2022-11-01 DIAGNOSIS — M797 Fibromyalgia: Secondary | ICD-10-CM | POA: Diagnosis not present

## 2022-11-01 DIAGNOSIS — K760 Fatty (change of) liver, not elsewhere classified: Secondary | ICD-10-CM | POA: Diagnosis not present

## 2022-11-01 DIAGNOSIS — M791 Myalgia, unspecified site: Secondary | ICD-10-CM | POA: Diagnosis not present

## 2022-11-01 DIAGNOSIS — I10 Essential (primary) hypertension: Secondary | ICD-10-CM | POA: Diagnosis not present

## 2022-11-01 DIAGNOSIS — M8580 Other specified disorders of bone density and structure, unspecified site: Secondary | ICD-10-CM | POA: Diagnosis not present

## 2022-11-01 DIAGNOSIS — I2510 Atherosclerotic heart disease of native coronary artery without angina pectoris: Secondary | ICD-10-CM | POA: Diagnosis not present

## 2022-11-14 DIAGNOSIS — H401232 Low-tension glaucoma, bilateral, moderate stage: Secondary | ICD-10-CM | POA: Diagnosis not present

## 2022-11-14 DIAGNOSIS — H26491 Other secondary cataract, right eye: Secondary | ICD-10-CM | POA: Diagnosis not present

## 2022-11-14 DIAGNOSIS — H04123 Dry eye syndrome of bilateral lacrimal glands: Secondary | ICD-10-CM | POA: Diagnosis not present

## 2022-11-22 DIAGNOSIS — M545 Low back pain, unspecified: Secondary | ICD-10-CM | POA: Diagnosis not present

## 2022-11-22 DIAGNOSIS — M542 Cervicalgia: Secondary | ICD-10-CM | POA: Diagnosis not present

## 2022-11-29 DIAGNOSIS — M542 Cervicalgia: Secondary | ICD-10-CM | POA: Diagnosis not present

## 2022-11-29 DIAGNOSIS — M545 Low back pain, unspecified: Secondary | ICD-10-CM | POA: Diagnosis not present

## 2023-01-24 ENCOUNTER — Encounter: Payer: Self-pay | Admitting: Cardiovascular Disease

## 2023-01-24 ENCOUNTER — Ambulatory Visit: Payer: Medicare Other | Attending: Cardiovascular Disease | Admitting: Cardiovascular Disease

## 2023-01-24 VITALS — BP 138/70 | HR 65 | Ht 62.0 in | Wt 127.2 lb

## 2023-01-24 DIAGNOSIS — I447 Left bundle-branch block, unspecified: Secondary | ICD-10-CM | POA: Diagnosis not present

## 2023-01-24 DIAGNOSIS — I350 Nonrheumatic aortic (valve) stenosis: Secondary | ICD-10-CM | POA: Diagnosis not present

## 2023-01-24 DIAGNOSIS — I2510 Atherosclerotic heart disease of native coronary artery without angina pectoris: Secondary | ICD-10-CM | POA: Diagnosis not present

## 2023-01-24 DIAGNOSIS — I10 Essential (primary) hypertension: Secondary | ICD-10-CM | POA: Diagnosis not present

## 2023-01-24 DIAGNOSIS — E7800 Pure hypercholesterolemia, unspecified: Secondary | ICD-10-CM | POA: Insufficient documentation

## 2023-01-24 NOTE — Progress Notes (Signed)
Patient ID: Kimberly Stone, female   DOB: 09-Dec-1946, 77 y.o.   MRN: [id number]       01/24/2023  Reason for office visit AS, CAD, hyperlipidemia, statin intolerance  The patient is a 77 y.o. year-old female with history of CAD (NSTEMI 2005 found to have severe disease in ramus intermedius too diffusely diseased to allow PCI), mild calcific aortic stenosis (mean gradient 14 mmHg in February 2020), HTN, HLD, GERD, previous obesity and pre-diabetes s/p gastric bypass surgery.  Kimberly Stone is a 77 year old woman with a history of coronary disease (presented with non-STEMI in 2005 related to severe diffuse disease in ramus intermedius, not amenable to PCI), mild calcific aortic stenosis, hypertension, hyperlipidemia, GERD, resolution of obesity and prediabetes after gastric bypass surgery.    She does not engage in routine physical exercise, but takes care of housework and yard work without any issues of shortness of breath, chest tightness, dizziness/syncope.  She has not had palpitations, lower extremity edema, claudication or focal neurological events.  She is intolerant to statins due to myopathy and liver test abnormalities (she has tried simvastatin, atorvastatin, rosuvastatin, Livalo, Zetia), but has good lipid profile on treatment with Praluent.  Her most recent lipid profile shows an excellent LDL of 50, minimally elevated triglycerides at 200.  She has normal renal function.  She has never smoked.  Despite previous cholecystectomy she had common bile duct obstruction with pancreatitis in 2022.  She has recovered well from this.  Her ECG today shows a new left bundle branch block.  She was unaware of this and has no symptoms that can be attributed to it.  She has bilateral carotid bruits that appear to be radiating from the aortic stenosis murmur in her chest.  She had duplex ultrasound of the carotid arteries in October 2022 that did not show any significant stenoses and relatively minor  plaque.   Allergies  Allergen Reactions   Aspirin Other (See Comments)    PT CANNOT TAKE DUE TO HX OF GASTRIC BYPASS SURGERY    Imodium [Loperamide] Palpitations   Statins     Other reaction(s): Other Joint pain   Erythromycin Other (See Comments)    UNKNOWN    Current Outpatient Medications  Medication Sig Dispense Refill   acetaminophen (TYLENOL) 650 MG CR tablet Take 650 mg by mouth as needed for pain.      amLODipine (NORVASC) 5 MG tablet Take 5 mg by mouth at bedtime.      clopidogrel (PLAVIX) 75 MG tablet TAKE 1 TABLET (75 MG TOTAL) BY MOUTH DAILY. PLEASE SCHEDULE APPOINTMENT FOR REFILLS. 7 tablet 0   enalapril (VASOTEC) 5 MG tablet Take 5 mg by mouth at bedtime.      metoCLOPramide (REGLAN) 5 MG tablet Take 5 mg by mouth every 6 (six) hours as needed.      Multiple Vitamin (MULTIVITAMIN WITH MINERALS) TABS Take 2 tablets by mouth daily.     pantoprazole (PROTONIX) 40 MG tablet Take 40 mg by mouth daily with breakfast.     PRESCRIPTION MEDICATION Inject 1 application into the muscle. INJECTION every 21-30 days     Vitamin D, Ergocalciferol, (DRISDOL) 50000 UNITS CAPS Take 50,000 Units by mouth every Monday, Wednesday, and Friday.      No current facility-administered medications for this visit.    Past Medical History:  Diagnosis Date   Acute MI ([place]) 11/2004   NONSTEMI 2005 NOINTERVENTION PER NOTE    Anemia    from bypass- iron and B12  Anxiety    Arthritis    Coronary artery disease    DDD (degenerative disc disease), cervical    Depression    DJD (degenerative joint disease)    Fibromyalgia    GERD (gastroesophageal reflux disease)    GIB (gastrointestinal bleeding)    while on celecoxib post bypass   Hyperlipidemia    Hyperlipidemia LDL goal < 130 01/23/2014   Hypertension    Obesity    hx of bipass, now resolved.  borderline diabetes also resolved.   Osteopenia    Post-menopausal    SCCA (squamous cell carcinoma) of skin 09/21/2015   left forearm medial  cx3,73f   SCCA (squamous cell carcinoma) of skin 07/26/2016   bridge of nose cx3 533f  SCCA (squamous cell carcinoma) of skin 02/10/2020   right inner cheek tx after biopsy   SCCA (squamous cell carcinoma) of skin 02/10/2020   left hand tx after biopsy   Scoliosis    Sinusitis    Statin intolerance 01/23/2014    Past Surgical History:  Procedure Laterality Date   CARDIAC CATHETERIZATION     2005 Severely disease small optional diagonal vessel responsible far wall motion abnormalities wise to small and diffusely diseased for consideration of interventio.   CATARACT EXTRACTION     COLONOSCOPY     GASTRIC BYPASS  01/2004   KNEE ARTHROSCOPY Right    LAPAROSCOPIC CHOLECYSTECTOMY  1993   TOTAL VAGINAL HYSTERECTOMY  1973   secondary to DUB - Dr. MaConnye Burkittnd Dr. SmTamala Julian  Family History  Problem Relation Age of Onset   Heart failure Mother    Liver cancer Father    Cancer Brother    Heart failure Maternal Grandmother    Aneurysm Maternal Grandmother        ruptured abdominal aortic aneurysm   Lung cancer Brother [id number]  Social History   Socioeconomic History   Marital status: Married    Spouse name: CaClifton James Number of children: 3   Years of education: LPN   Highest education level: Not on file  Occupational History   Occupation: Retired  Tobacco Use   Smoking status: Former    Packs/day: 0.25    Years: 5.00    Total pack years: 1.25    Types: Cigarettes    Start date: 1993    Quit date: 12/18/1996    Years since quitting: 26.1   Smokeless tobacco: Never  Substance and Sexual Activity   Alcohol use: Yes    Alcohol/week: 1.0 - 2.0 standard drink of alcohol    Types: 1 - 2 Glasses of wine per week    Comment: occasional  wine.     Drug use: No   Sexual activity: Not Currently    Birth control/protection: Surgical    Comment: hysterectomy-1973  Other Topics Concern   Not on file  Social History Narrative   Pt lives at home with family.  Drives and does not use assist  device.     Caffeine Use: 2 cups daily         Social Determinants of Health   Financial Resource Strain: Not on file  Food Insecurity: Not on file  Transportation Needs: Not on file  Physical Activity: Not on file  Stress: Not on file  Social Connections: Not on file  Intimate Partner Violence: Not on file    Review of systems: Please see history of present illness.  Denies other complaints.   PHYSICAL EXAM BP 138/70  Pulse 65   Ht '5\' 2"'$  (1.575 m)   Wt 127 lb 3.2 oz (57.7 kg)   LMP 03/18/1972 (Approximate)   SpO2 96%   BMI 23.27 kg/m     General: Alert, oriented x3, no distress, appears lean and fit Head: no evidence of trauma, PERRL, EOMI, no exophtalmos or lid lag, no myxedema, no xanthelasma; normal ears, nose and oropharynx Neck: normal jugular venous pulsations and no hepatojugular reflux; brisk carotid pulses without delay, but with loud carotid bruits radiating from the chest all the way to the angle of the jaw on both sides  Chest: clear to auscultation, no signs of consolidation by percussion or palpation, normal fremitus, symmetrical and full respiratory excursions Cardiovascular: normal position and quality of the apical impulse, regular rhythm, normal first and paradoxically split second heart sounds, early peaking 3/6 aortic ejection murmur no diastolic murmurs, rubs or gallops Abdomen: no tenderness or distention, no masses by palpation, no abnormal pulsatility or arterial bruits, normal bowel sounds, no hepatosplenomegaly Extremities: no clubbing, cyanosis or edema; 2+ radial, ulnar and brachial pulses bilaterally; 2+ right femoral, posterior tibial and dorsalis pedis pulses; 2+ left femoral, posterior tibial and dorsalis pedis pulses; no subclavian or femoral bruits Neurological: grossly nonfocal Psych: Normal mood and affect   Echocardiogram 08/31/2021:   1. Left ventricular ejection fraction, by estimation, is 60 to 65%. The  left ventricle has normal  function. The left ventricle has no regional  wall motion abnormalities. There is mild asymmetric left ventricular  hypertrophy. Left ventricular diastolic  parameters were normal.   2. Right ventricular systolic function is moderately reduced. The right  ventricular size is normal.   3. The mitral valve is normal in structure. Mild mitral valve  regurgitation.   4. The aortic valve is grossly normal. Aortic valve regurgitation is  mild. Mild aortic valve stenosis.   Comparison(s): 01/30/19 EF 55-60%. Mild AS 23mHg mean PG, 219mg peak PG.   RIGHT VENTRICLE  RV Basal diam:  2.60 cm  RV S prime:     7.48 cm/s  TAPSE (M-mode): 1.3 cm   AORTIC VALVE  AV Area (Vmax):    1.66 cm  AV Area (Vmean):   1.57 cm  AV Area (VTI):     1.62 cm  AV Vmax:           223.50 cm/s  AV Vmean:          157.000 cm/s  AV VTI:            0.460 m  AV Peak Grad:      20.0 mmHg  AV Mean Grad:      11.0 mmHg  LVOT Vmax:         107.00 cm/s  LVOT Vmean:        71.300 cm/s  LVOT VTI:          0.215 m  LVOT/AV VTI ratio: 0.47  AI PHT:            513 msec   EKG: Ordered today and personally reviewed shows sinus rhythm, new left bundle branch block (QRS 142 ms), left axis deviation, QTc 499 ms.  Lipid Panel on therapy, currently stopped    Component Value Date/Time   CHOL 165 01/04/2014 0235   TRIG 86 01/04/2014 0235   HDL 79 01/04/2014 0235   CHOLHDL 2.1 01/04/2014 0235   VLDL 17 01/04/2014 0235   LDLCALC 69 01/04/2014 0235  02/25/2020 [place] 256, HDL 62, LDL 149, triglycerides 225  Hemoglobin 12.7, creatinine 0.8, TSH 1.63  03/03/2021 Cholesterol 153, HDL 63, LDL 61, triglycerides 144 Hemoglobin 13.6, potassium 4.4  11/01/2022 Cholesterol 155, HDL 65, LDL 50, triglycerides 200   BMET    Component Value Date/Time   NA 132 (L) 06/13/2015 0254   K 4.8 06/13/2015 0254   CL 100 (L) 06/13/2015 0254   CO2 23 06/13/2015 0254   GLUCOSE 143 (H) 06/13/2015 0254   BUN 14 06/13/2015 0254    CREATININE 0.85 06/13/2015 0254   CALCIUM 9.0 06/13/2015 0254   GFRNONAA >60 06/13/2015 0254   GFRAA >60 06/13/2015 0254     ASSESSMENT AND PLAN:  1. Coronary artery disease involving native coronary artery of native heart without angina pectoris   2. Nonrheumatic aortic valve stenosis   3. Hypercholesterolemia   4. Essential hypertension   5. LBBB (left bundle branch block)       1. CAD: She is asymptomatic with amlodipine 5 mg daily as her only antiarrhythmic agent.  She is on chronic antiplatelet therapy with clopidogrel. 2. AS: Mild by most recent echocardiogram performed in September 2022, showing virtually no change when compared with the previous study from February 2020.  Unless she develops symptoms, we will plan to check this again about every 3 years (in 2025).  Asked her to promptly report exertional dyspnea/angina/syncope, which would prompt sooner evaluation. 3. HLP: Excellent LDL cholesterol control on treatment with PCSK9 inhibitor. 4. HTN: Adequate control.  She reports that at home her typical blood pressure is 110-120/70s. 5. Bilateral carotid bruits: Radiating from the aortic stenosis murmur, no evidence of carotid stenosis by duplex ultrasound in 2022. 6. LBBB: This is a new finding.  Asymptomatic.  She does not have any complaints of suggest high-grade AV block.  Discussed the fact that this makes the diagnostic value of her ECG tracing a lot lower.  Also reviewed the possibility that she may progress to AV block over time and eventually require pacemaker.  This is not indicated at this time.  Patient Instructions  Medication Instructions:  Your physician recommends that you continue on your current medications as directed. Please refer to the Current Medication list given to you today.  *If you need a refill on your cardiac medications before your next appointment, please call your pharmacy*   Lab Work: NONE If you have labs (blood work) drawn today and your  tests are completely normal, you will receive your results only by: [place] (if you have MyChart) OR A paper copy in the mail If you have any lab test that is abnormal or we need to change your treatment, we will call you to review the results.   Testing/Procedures: NONE   Follow-Up: At [facility], you and your health needs are our priority.  As part of our continuing mission to provide you with exceptional heart care, we have created designated Provider Care Teams.  These Care Teams include your primary Cardiologist (physician) and Advanced Practice Providers (APPs -  Physician Assistants and Nurse Practitioners) who all work together to provide you with the care you need, when you need it.  We recommend signing up for the patient portal called "MyChart".  Sign up information is provided on this After Visit Summary.  MyChart is used to connect with patients for Virtual Visits (Telemedicine).  Patients are able to view lab/test results, encounter notes, upcoming appointments, etc.  Non-urgent messages can be sent to your provider as well.   To learn more about what you  can do with MyChart, go to [email or web address].    Your next appointment:   1 year(s)  Provider:   Sanda Klein, MD     No orders of the defined types were placed in this encounter.   No orders of the defined types were placed in this encounter.    Kimberly Stone  Sanda Klein, MD, [facility] [phone] office [phone] pager

## 2023-01-24 NOTE — Patient Instructions (Signed)
Medication Instructions:  Your physician recommends that you continue on your current medications as directed. Please refer to the Current Medication list given to you today.  *If you need a refill on your cardiac medications before your next appointment, please call your pharmacy*   Lab Work: NONE If you have labs (blood work) drawn today and your tests are completely normal, you will receive your results only by: [place] (if you have MyChart) OR A paper copy in the mail If you have any lab test that is abnormal or we need to change your treatment, we will call you to review the results.   Testing/Procedures: NONE   Follow-Up: At [facility], you and your health needs are our priority.  As part of our continuing mission to provide you with exceptional heart care, we have created designated Provider Care Teams.  These Care Teams include your primary Cardiologist (physician) and Advanced Practice Providers (APPs -  Physician Assistants and Nurse Practitioners) who all work together to provide you with the care you need, when you need it.  We recommend signing up for the patient portal called "MyChart".  Sign up information is provided on this After Visit Summary.  MyChart is used to connect with patients for Virtual Visits (Telemedicine).  Patients are able to view lab/test results, encounter notes, upcoming appointments, etc.  Non-urgent messages can be sent to your provider as well.   To learn more about what you can do with MyChart, go to [email or web address].    Your next appointment:   1 year(s)  Provider:   Sanda Klein, MD

## 2023-01-25 NOTE — Addendum Note (Signed)
Addended by: Wonda Horner on: 01/25/2023 12:31 PM   Modules accepted: Orders

## 2023-02-20 DIAGNOSIS — G459 Transient cerebral ischemic attack, unspecified: Secondary | ICD-10-CM | POA: Diagnosis not present

## 2023-02-20 DIAGNOSIS — I2510 Atherosclerotic heart disease of native coronary artery without angina pectoris: Secondary | ICD-10-CM | POA: Diagnosis not present

## 2023-02-20 DIAGNOSIS — E785 Hyperlipidemia, unspecified: Secondary | ICD-10-CM | POA: Diagnosis not present

## 2023-02-20 DIAGNOSIS — F329 Major depressive disorder, single episode, unspecified: Secondary | ICD-10-CM | POA: Diagnosis not present

## 2023-02-20 DIAGNOSIS — I352 Nonrheumatic aortic (valve) stenosis with insufficiency: Secondary | ICD-10-CM | POA: Diagnosis not present

## 2023-02-20 DIAGNOSIS — N3281 Overactive bladder: Secondary | ICD-10-CM | POA: Diagnosis not present

## 2023-02-20 DIAGNOSIS — K219 Gastro-esophageal reflux disease without esophagitis: Secondary | ICD-10-CM | POA: Diagnosis not present

## 2023-02-20 DIAGNOSIS — R1111 Vomiting without nausea: Secondary | ICD-10-CM | POA: Diagnosis not present

## 2023-02-20 DIAGNOSIS — M1990 Unspecified osteoarthritis, unspecified site: Secondary | ICD-10-CM | POA: Diagnosis not present

## 2023-02-20 DIAGNOSIS — I10 Essential (primary) hypertension: Secondary | ICD-10-CM | POA: Diagnosis not present

## 2023-05-01 DIAGNOSIS — E611 Iron deficiency: Secondary | ICD-10-CM | POA: Diagnosis not present

## 2023-05-01 DIAGNOSIS — I10 Essential (primary) hypertension: Secondary | ICD-10-CM | POA: Diagnosis not present

## 2023-05-01 DIAGNOSIS — K219 Gastro-esophageal reflux disease without esophagitis: Secondary | ICD-10-CM | POA: Diagnosis not present

## 2023-05-01 DIAGNOSIS — E785 Hyperlipidemia, unspecified: Secondary | ICD-10-CM | POA: Diagnosis not present

## 2023-05-01 DIAGNOSIS — R7989 Other specified abnormal findings of blood chemistry: Secondary | ICD-10-CM | POA: Diagnosis not present

## 2023-05-01 DIAGNOSIS — M797 Fibromyalgia: Secondary | ICD-10-CM | POA: Diagnosis not present

## 2023-05-01 DIAGNOSIS — D649 Anemia, unspecified: Secondary | ICD-10-CM | POA: Diagnosis not present

## 2023-05-01 DIAGNOSIS — Z1212 Encounter for screening for malignant neoplasm of rectum: Secondary | ICD-10-CM | POA: Diagnosis not present

## 2023-05-01 DIAGNOSIS — M8580 Other specified disorders of bone density and structure, unspecified site: Secondary | ICD-10-CM | POA: Diagnosis not present

## 2023-05-08 DIAGNOSIS — R82998 Other abnormal findings in urine: Secondary | ICD-10-CM | POA: Diagnosis not present

## 2023-05-08 DIAGNOSIS — R413 Other amnesia: Secondary | ICD-10-CM | POA: Diagnosis not present

## 2023-05-08 DIAGNOSIS — G459 Transient cerebral ischemic attack, unspecified: Secondary | ICD-10-CM | POA: Diagnosis not present

## 2023-05-08 DIAGNOSIS — I10 Essential (primary) hypertension: Secondary | ICD-10-CM | POA: Diagnosis not present

## 2023-05-08 DIAGNOSIS — M419 Scoliosis, unspecified: Secondary | ICD-10-CM | POA: Diagnosis not present

## 2023-05-08 DIAGNOSIS — I351 Nonrheumatic aortic (valve) insufficiency: Secondary | ICD-10-CM | POA: Diagnosis not present

## 2023-05-08 DIAGNOSIS — I350 Nonrheumatic aortic (valve) stenosis: Secondary | ICD-10-CM | POA: Diagnosis not present

## 2023-05-08 DIAGNOSIS — E785 Hyperlipidemia, unspecified: Secondary | ICD-10-CM | POA: Diagnosis not present

## 2023-05-08 DIAGNOSIS — Z0000 Encounter for general adult medical examination without abnormal findings: Secondary | ICD-10-CM | POA: Diagnosis not present

## 2023-05-08 DIAGNOSIS — I2510 Atherosclerotic heart disease of native coronary artery without angina pectoris: Secondary | ICD-10-CM | POA: Diagnosis not present

## 2023-05-08 DIAGNOSIS — D649 Anemia, unspecified: Secondary | ICD-10-CM | POA: Diagnosis not present

## 2023-05-08 DIAGNOSIS — K760 Fatty (change of) liver, not elsewhere classified: Secondary | ICD-10-CM | POA: Diagnosis not present

## 2023-05-08 DIAGNOSIS — Z1339 Encounter for screening examination for other mental health and behavioral disorders: Secondary | ICD-10-CM | POA: Diagnosis not present

## 2023-05-08 DIAGNOSIS — Z1331 Encounter for screening for depression: Secondary | ICD-10-CM | POA: Diagnosis not present

## 2023-05-23 DIAGNOSIS — H524 Presbyopia: Secondary | ICD-10-CM | POA: Diagnosis not present

## 2023-05-23 DIAGNOSIS — H26491 Other secondary cataract, right eye: Secondary | ICD-10-CM | POA: Diagnosis not present

## 2023-05-23 DIAGNOSIS — H401232 Low-tension glaucoma, bilateral, moderate stage: Secondary | ICD-10-CM | POA: Diagnosis not present

## 2023-05-23 DIAGNOSIS — H04123 Dry eye syndrome of bilateral lacrimal glands: Secondary | ICD-10-CM | POA: Diagnosis not present

## 2023-05-23 DIAGNOSIS — H52223 Regular astigmatism, bilateral: Secondary | ICD-10-CM | POA: Diagnosis not present

## 2023-05-23 DIAGNOSIS — H40013 Open angle with borderline findings, low risk, bilateral: Secondary | ICD-10-CM | POA: Diagnosis not present

## 2023-05-28 DIAGNOSIS — Z1283 Encounter for screening for malignant neoplasm of skin: Secondary | ICD-10-CM | POA: Diagnosis not present

## 2023-05-28 DIAGNOSIS — M9901 Segmental and somatic dysfunction of cervical region: Secondary | ICD-10-CM | POA: Diagnosis not present

## 2023-05-28 DIAGNOSIS — L568 Other specified acute skin changes due to ultraviolet radiation: Secondary | ICD-10-CM | POA: Diagnosis not present

## 2023-05-28 DIAGNOSIS — L570 Actinic keratosis: Secondary | ICD-10-CM | POA: Diagnosis not present

## 2023-05-28 DIAGNOSIS — M5411 Radiculopathy, occipito-atlanto-axial region: Secondary | ICD-10-CM | POA: Diagnosis not present

## 2023-05-28 DIAGNOSIS — D485 Neoplasm of uncertain behavior of skin: Secondary | ICD-10-CM | POA: Diagnosis not present

## 2023-05-28 DIAGNOSIS — L578 Other skin changes due to chronic exposure to nonionizing radiation: Secondary | ICD-10-CM | POA: Diagnosis not present

## 2023-05-30 DIAGNOSIS — M9901 Segmental and somatic dysfunction of cervical region: Secondary | ICD-10-CM | POA: Diagnosis not present

## 2023-05-30 DIAGNOSIS — M25551 Pain in right hip: Secondary | ICD-10-CM | POA: Diagnosis not present

## 2023-05-30 DIAGNOSIS — M5411 Radiculopathy, occipito-atlanto-axial region: Secondary | ICD-10-CM | POA: Diagnosis not present

## 2023-06-06 DIAGNOSIS — M25551 Pain in right hip: Secondary | ICD-10-CM | POA: Diagnosis not present

## 2023-06-07 DIAGNOSIS — M25551 Pain in right hip: Secondary | ICD-10-CM | POA: Diagnosis not present

## 2023-06-12 ENCOUNTER — Encounter: Payer: Self-pay | Admitting: Professional Counselor

## 2023-06-12 ENCOUNTER — Ambulatory Visit (INDEPENDENT_AMBULATORY_CARE_PROVIDER_SITE_OTHER): Payer: Medicare Other | Admitting: Professional Counselor

## 2023-06-12 DIAGNOSIS — F331 Major depressive disorder, recurrent, moderate: Secondary | ICD-10-CM

## 2023-06-12 NOTE — Progress Notes (Signed)
[facility] Counselor Initial Adult Exam  Name: Kimberly Stone Date: 06/12/2023 MRN: [id number] DOB: Dec 03, 1946 PCP: Rodrigo Ran, MD  Time spent: 12:01p - 12:57p   Guardian/Payee:  client     Paperwork requested:  No   Reason for Visit /Presenting Problem: depression, mild anxiety  Mental Status Exam:    Appearance:   Neat     Behavior:  Motivated  Motor:  Normal  Speech/Language:   Normal Rate  Affect:  Congruent  Mood:  normal  Thought process:  normal  Thought content:    WNL  Sensory/Perceptual disturbances:    WNL  Orientation:  oriented to person, place, time  Attention:  Good  Concentration:  Good  Memory:  WNL  Fund of knowledge:   Good  Insight:    Good  Judgment:   Good  Impulse Control:  Good   Reported Symptoms:  low motivation, sleeplessness, restlessness, trouble relaxing, some irritability, anhedonia, sadness, worries, low energy, fatigue   Risk Assessment: Danger to Self:  No Self-injurious Behavior: No Danger to Others: No Duty to Warn:no Physical Aggression / Violence:No  Access to Firearms a concern: No  Gang Involvement: n/a Patient / guardian was educated about steps to take if suicide or homicide risk level increases between visits: n/a While future psychiatric events cannot be accurately predicted, the patient does not currently require acute inpatient psychiatric care and does not currently meet [facility] involuntary commitment criteria.  Substance Abuse History: Current substance abuse: No     Past Psychiatric History:   No previous psychological problems have been observed Outpatient Providers:n/a History of Psych Hospitalization: No  Psychological Testing:  n/a    Abuse History: Victim of No.,  n/a    Report needed: No. Victim of Neglect:No. Perpetrator of  n/a   Witness / Exposure to Domestic Violence: No   Protective Services Involvement:  n/a Witness to [employer] Violence:  No   Family History:  Family History   Problem Relation Age of Onset   Heart failure Mother    Liver cancer Father    Cancer Brother    Heart failure Maternal Grandmother    Aneurysm Maternal Grandmother        ruptured abdominal aortic aneurysm   Lung cancer Brother 77    Living situation: the patient lives with their family  Sexual Orientation:  Straight  Relationship Status: married  Name of spouse / other: name protected for privacy by clinician              If a parent, number of children / ages: 2 adult children, 2 adult grandchildren (client raised)   Lawyer; husband, family, church Financial planner Stress:  No   Income/Employment/Disability: Retired   Financial planner: No   Educational History: Education:  nursing degree  Religion/Sprituality/World View:    Christian   Any cultural differences that may affect / interfere with treatment:  n/a  Recreation/Hobbies: sewing, oil painting   Stressors:Other: sleeplessness, low motivation    Strengths:  Supportive Relationships, Family, and Church  Barriers:  n/a   Legal History: Pending legal issue / charges: The patient has no significant history of legal issues. History of legal issue / charges:  n/a  Medical History/Surgical History:reviewed Past Medical History:  Diagnosis Date   Acute MI ([facility]) 11/2004   NONSTEMI 2005 NOINTERVENTION PER NOTE    Anemia    from bypass- iron and B12   Anxiety    Arthritis    Coronary artery disease  DDD (degenerative disc disease), cervical    Depression    DJD (degenerative joint disease)    Fibromyalgia    GERD (gastroesophageal reflux disease)    GIB (gastrointestinal bleeding)    while on celecoxib post bypass   Hyperlipidemia    Hyperlipidemia LDL goal < 130 01/23/2014   Hypertension    Obesity    hx of bipass, now resolved.  borderline diabetes also resolved.   Osteopenia    Post-menopausal    SCCA (squamous cell carcinoma) of skin 09/21/2015   left forearm medial cx3,44fu    SCCA (squamous cell carcinoma) of skin 07/26/2016   bridge of nose cx3 56fu   SCCA (squamous cell carcinoma) of skin 02/10/2020   right inner cheek tx after biopsy   SCCA (squamous cell carcinoma) of skin 02/10/2020   left hand tx after biopsy   Scoliosis    Sinusitis    Statin intolerance 01/23/2014    Past Surgical History:  Procedure Laterality Date   CARDIAC CATHETERIZATION     2005 Severely disease small optional diagonal vessel responsible far wall motion abnormalities wise to small and diffusely diseased for consideration of interventio.   CATARACT EXTRACTION     COLONOSCOPY     GASTRIC BYPASS  01/2004   KNEE ARTHROSCOPY Right    LAPAROSCOPIC CHOLECYSTECTOMY  1993   TOTAL VAGINAL HYSTERECTOMY  1973   secondary to DUB - Dr. Roberto Scales and Dr. Katrinka Blazing    Medications: Current Outpatient Medications  Medication Sig Dispense Refill   acetaminophen (TYLENOL) 650 MG CR tablet Take 650 mg by mouth as needed for pain.      amLODipine (NORVASC) 5 MG tablet Take 5 mg by mouth at bedtime.      clopidogrel (PLAVIX) 75 MG tablet TAKE 1 TABLET (75 MG TOTAL) BY MOUTH DAILY. PLEASE SCHEDULE APPOINTMENT FOR REFILLS. 7 tablet 0   enalapril (VASOTEC) 5 MG tablet Take 5 mg by mouth at bedtime.      metoCLOPramide (REGLAN) 5 MG tablet Take 5 mg by mouth every 6 (six) hours as needed.      Multiple Vitamin (MULTIVITAMIN WITH MINERALS) TABS Take 2 tablets by mouth daily.     pantoprazole (PROTONIX) 40 MG tablet Take 40 mg by mouth daily with breakfast.     PRESCRIPTION MEDICATION Inject 1 application into the muscle. INJECTION every 21-30 days     Vitamin D, Ergocalciferol, (DRISDOL) 50000 UNITS CAPS Take 50,000 Units by mouth every Monday, Wednesday, and Friday.      No current facility-administered medications for this visit.    Allergies  Allergen Reactions   Aspirin Other (See Comments)    PT CANNOT TAKE DUE TO HX OF GASTRIC BYPASS SURGERY    Imodium [Loperamide] Palpitations   Statins      Other reaction(s): Other Joint pain   Erythromycin Other (See Comments)    UNKNOWN    Diagnoses:    ICD-10-CM   1. Major depressive disorder, recurrent episode, moderate (HCC)  F33.1      Counselor provided person-centered counseling; clinical assessment; facilitated PHQ (score 17) & GAD (score 10) assessments. Counselor and client worked to Scientist, forensic. Client participated in her treatment plan; she worked with counselor to create counseling goals. Client reported marked sleep loss, fatigue, low motivation, anhedonia in past year and a half and medications as prescribed by PCP to not be as efficacious as she has hoped; PCP referred client to practice. Client voiced desire for maintenance counseling but moreso for psychiatric referral within  practice, for purpose of specialization; counselor assisted in facilitating referral at checkout w scheduler after appt. Client shared trauma hx including loss of family member under tragic circumstances. She voiced experience of sustained stress, and lifelong resilience as a protective factor, alongside husband, family and faith as significant supports. Client identified somatic challenges of arthritis and fibromyalgia.   Plan of Care: Client to schedule follow-up in approx a month, per client desire to focus on psychiatric approach, primarily. Client to schedule with medical referral within practice per counselor facilitation. Review and obtain consent for treatment plan. Continue to assess hx, symptoms, build rapport; process work and Conservation officer, historic buildings.   Bartolo Darter, [facility]

## 2023-06-12 NOTE — Patient Instructions (Signed)
To schedule follow-up w provider Schedule w medical provider within practice for med consult

## 2023-07-05 ENCOUNTER — Ambulatory Visit (INDEPENDENT_AMBULATORY_CARE_PROVIDER_SITE_OTHER): Payer: Medicare Other | Admitting: Adult Health

## 2023-07-05 ENCOUNTER — Encounter: Payer: Self-pay | Admitting: Adult Health

## 2023-07-05 VITALS — BP 139/65 | HR 74 | Ht 62.0 in | Wt 124.0 lb

## 2023-07-05 DIAGNOSIS — F331 Major depressive disorder, recurrent, moderate: Secondary | ICD-10-CM | POA: Diagnosis not present

## 2023-07-05 DIAGNOSIS — F411 Generalized anxiety disorder: Secondary | ICD-10-CM

## 2023-07-05 DIAGNOSIS — G4700 Insomnia, unspecified: Secondary | ICD-10-CM

## 2023-07-05 MED ORDER — BUPROPION HCL ER (XL) 300 MG PO TB24: 300.0000 mg | ORAL_TABLET | Freq: Every day | ORAL | 2 refills | Status: DC

## 2023-07-05 MED ORDER — DULOXETINE HCL 60 MG PO CPEP: 60.0000 mg | ORAL_CAPSULE | Freq: Every day | ORAL | 2 refills | Status: DC

## 2023-07-05 MED ORDER — HYDROXYZINE HCL 25 MG PO TABS
ORAL_TABLET | ORAL | 2 refills | Status: DC
Start: 2023-07-05 — End: 2024-03-21

## 2023-07-05 NOTE — Progress Notes (Signed)
[facility] MD/PA/NP Initial Note  07/05/2023 8:21 AM Kimberly Stone  MRN:  [id number]  Chief Complaint:   HPI:   Patient seen today for initial psychiatric evaluation.  Referred by PCP.  Describes mood today as "not too good". Pleasant. Denies tearfulness. Mood symptoms - reports increased depression, anxiety, and irritability. Denies recent panic attacks. Reports worry, rumination and over thinking. Denies obsessive thoughts and acts. Mood is lower - "it has been for years". Reports a loss of interest and motivation. Typically loves to sew quilts and paint, but has no desire to do that or anything. Has not been completing tasks around the house. Feels like symptoms has worsened over the past six months - feels like she has given up. Stating "I'm just tired". Has been working with her PCP to help manage mood symptoms, but is not feeling any better. She is willing to consider other treatment options. Has met with therapist once and plans to return. Decreased interest and motivation. Taking medications as prescribed.  Energy levels lower - having to push herself. Active, does not have a regular exercise routine.  Enjoys some usual interests and activities. Married. Lives with husband. Has 2 sons - 37 and 54 - lost her daughter at age 33. Has 5 grandchildren. Spending time with family.  Appetite adequate. Gastric bypass in 2003. Weight stable - 124 pounds. Reports difficulties sleeping. Averages 5 to 6 hours - does better with 7 to 8 hours. Napping an hour or 2 a day. Focus and concentration stable. Completing tasks. Managing aspects of household. Retired - owns an assistive living facility center. Denies SI or HI.  Denies AH or VH. Denies self harm. Denies substance use.   Previous medication trials: Cymbalta, Wellbutrin  Visit Diagnosis: No diagnosis found.  Past Psychiatric History: Denies psychiatric hospitalization.   Past Medical History:  Past Medical History:  Diagnosis Date    Acute MI (HCC) 11/2004   NONSTEMI 2005 NOINTERVENTION PER NOTE    Anemia    from bypass- iron and B12   Anxiety    Arthritis    Coronary artery disease    DDD (degenerative disc disease), cervical    Depression    DJD (degenerative joint disease)    Fibromyalgia    GERD (gastroesophageal reflux disease)    GIB (gastrointestinal bleeding)    while on celecoxib post bypass   Hyperlipidemia    Hyperlipidemia LDL goal < 130 01/23/2014   Hypertension    Obesity    hx of bipass, now resolved.  borderline diabetes also resolved.   Osteopenia    Post-menopausal    SCCA (squamous cell carcinoma) of skin 09/21/2015   left forearm medial cx3,37fu   SCCA (squamous cell carcinoma) of skin 07/26/2016   bridge of nose cx3 71fu   SCCA (squamous cell carcinoma) of skin 02/10/2020   right inner cheek tx after biopsy   SCCA (squamous cell carcinoma) of skin 02/10/2020   left hand tx after biopsy   Scoliosis    Sinusitis    Statin intolerance 01/23/2014    Past Surgical History:  Procedure Laterality Date   CARDIAC CATHETERIZATION     2005 Severely disease small optional diagonal vessel responsible far wall motion abnormalities wise to small and diffusely diseased for consideration of interventio.   CATARACT EXTRACTION     COLONOSCOPY     GASTRIC BYPASS  01/2004   KNEE ARTHROSCOPY Right    LAPAROSCOPIC CHOLECYSTECTOMY  1993   TOTAL VAGINAL HYSTERECTOMY  1973   secondary  to DUB - Dr. Roberto Scales and Dr. Katrinka Blazing    Family Psychiatric History: Denies any family history of mental illness.   Family History:  Family History  Problem Relation Age of Onset   Heart failure Mother    Liver cancer Father    Cancer Brother    Heart failure Maternal Grandmother    Aneurysm Maternal Grandmother        ruptured abdominal aortic aneurysm   Lung cancer Brother 31    Social History:  Social History   Socioeconomic History   Marital status: Married    Spouse name: Kimberly Stone   Number of children: 3   Years  of education: LPN   Highest education level: Not on file  Occupational History   Occupation: Retired  Tobacco Use   Smoking status: Former    Current packs/day: 0.00    Average packs/day: 0.3 packs/day for 5.0 years (1.3 ttl pk-yrs)    Types: Cigarettes    Start date: 89    Quit date: 12/18/1996    Years since quitting: 26.5   Smokeless tobacco: Never  Substance and Sexual Activity   Alcohol use: Yes    Alcohol/week: 1.0 - 2.0 standard drink of alcohol    Types: 1 - 2 Glasses of wine per week    Comment: occasional  wine.     Drug use: No   Sexual activity: Not Currently    Birth control/protection: Surgical    Comment: hysterectomy-1973  Other Topics Concern   Not on file  Social History Narrative   Pt lives at home with family.  Drives and does not use assist device.     Caffeine Use: 2 cups daily         Social Determinants of Health   Financial Resource Strain: Low Risk  (06/12/2023)   Overall Financial Resource Strain (CARDIA)    Difficulty of Paying Living Expenses: Not hard at all  Food Insecurity: Not on file  Transportation Needs: Not on file  Physical Activity: Inactive (06/12/2023)   Exercise Vital Sign    Days of Exercise per Week: 0 days    Minutes of Exercise per Session: 0 min  Stress: Stress Concern Present (06/12/2023)   [employer] of Occupational Health - Occupational Stress Questionnaire    Feeling of Stress : Very much  Social Connections: Socially Integrated (06/12/2023)   Social Connection and Isolation Panel [NHANES]    Frequency of Communication with Friends and Family: More than three times a week    Frequency of Social Gatherings with Friends and Family: More than three times a week    Attends Religious Services: More than 4 times per year    Active Member of [employer] or Organizations: Yes    Attends Banker Meetings: Not on file    Marital Status: Married    Allergies:  Allergies  Allergen Reactions   Aspirin Other (See  Comments)    PT CANNOT TAKE DUE TO HX OF GASTRIC BYPASS SURGERY    Imodium [Loperamide] Palpitations   Statins     Other reaction(s): Other Joint pain   Erythromycin Other (See Comments)    UNKNOWN    Metabolic Disorder Labs: Lab Results  Component Value Date   HGBA1C 5.8 (H) 01/03/2014   MPG 120 (H) 01/03/2014   No results found for: "PROLACTIN" Lab Results  Component Value Date   CHOL 165 01/04/2014   TRIG 86 01/04/2014   HDL 79 01/04/2014   CHOLHDL 2.1 01/04/2014   VLDL  17 01/04/2014   LDLCALC 69 01/04/2014   No results found for: "TSH"  Therapeutic Level Labs: No results found for: "LITHIUM" No results found for: "VALPROATE" No results found for: "CBMZ"  Current Medications: Current Outpatient Medications  Medication Sig Dispense Refill   acetaminophen (TYLENOL) 650 MG CR tablet Take 650 mg by mouth as needed for pain.      amLODipine (NORVASC) 5 MG tablet Take 5 mg by mouth at bedtime.      clopidogrel (PLAVIX) 75 MG tablet TAKE 1 TABLET (75 MG TOTAL) BY MOUTH DAILY. PLEASE SCHEDULE APPOINTMENT FOR REFILLS. 7 tablet 0   enalapril (VASOTEC) 5 MG tablet Take 5 mg by mouth at bedtime.      metoCLOPramide (REGLAN) 5 MG tablet Take 5 mg by mouth every 6 (six) hours as needed.      Multiple Vitamin (MULTIVITAMIN WITH MINERALS) TABS Take 2 tablets by mouth daily.     pantoprazole (PROTONIX) 40 MG tablet Take 40 mg by mouth daily with breakfast.     PRESCRIPTION MEDICATION Inject 1 application into the muscle. INJECTION every 21-30 days     Vitamin D, Ergocalciferol, (DRISDOL) 50000 UNITS CAPS Take 50,000 Units by mouth every Monday, Wednesday, and Friday.      No current facility-administered medications for this visit.    Medication Side Effects: none  Orders placed this visit:  No orders of the defined types were placed in this encounter.   Psychiatric Specialty Exam:  Review of Systems  Musculoskeletal:  Negative for gait problem.  Neurological:  Negative  for tremors.  Psychiatric/Behavioral:         Please refer to HPI    Last menstrual period 03/18/1972.There is no height or weight on file to calculate BMI.  General Appearance: Casual and Neat  Eye Contact:  Good  Speech:  Clear and Coherent and Normal Rate  Volume:  Normal  Mood:  Anxious and Depressed  Affect:  Appropriate and Congruent  Thought Process:  Coherent and Descriptions of Associations: Intact  Orientation:  Full (Time, Place, and Person)  Thought Content: Logical   Suicidal Thoughts:  No  Homicidal Thoughts:  No  Memory:  WNL  Judgement:  Good  Insight:  Good  Psychomotor Activity:  Normal  Concentration:  Concentration: Good and Attention Span: Good  Recall:  Good  Fund of Knowledge: Good  Language: Good  Assets:  Communication Skills Desire for Improvement Financial Resources/Insurance Housing Intimacy Leisure Time Physical Health Resilience Social Support Talents/Skills Transportation Vocational/Educational  ADL's:  Intact  Cognition: WNL  Prognosis:  Good   Screenings:  GAD-7    Advertising copywriter from 06/12/2023 in [facility]  Total GAD-7 Score 10      PHQ2-9    Flowsheet Row Counselor from 06/12/2023 in [facility]  PHQ-2 Total Score 4  PHQ-9 Total Score 17      Flowsheet Row FERAHEME from 04/11/2021 in [facility]  C-SSRS RISK CATEGORY No Risk       Receiving Psychotherapy: Yes   Treatment Plan/Recommendations:   Plan:  PDMP reviewed  Increase Wellbutrin XL 150mg  to 300mg  denies seizure history Increase Cymbalta 30mg  to 60mg  daily  Add Hydroxyzine 25mg  - 1 to 2 at hs as needed for sleep  RTC 4 weeks  Patient advised to contact office with any questions, adverse effects, or acute worsening in signs and symptoms.   Dorothyann Gibbs, NP

## 2023-08-07 DIAGNOSIS — F419 Anxiety disorder, unspecified: Secondary | ICD-10-CM | POA: Diagnosis not present

## 2023-08-07 DIAGNOSIS — F329 Major depressive disorder, single episode, unspecified: Secondary | ICD-10-CM | POA: Diagnosis not present

## 2023-08-07 DIAGNOSIS — R413 Other amnesia: Secondary | ICD-10-CM | POA: Diagnosis not present

## 2023-08-07 DIAGNOSIS — R4181 Age-related cognitive decline: Secondary | ICD-10-CM | POA: Diagnosis not present

## 2023-08-09 ENCOUNTER — Encounter: Payer: Self-pay | Admitting: Adult Health

## 2023-08-09 ENCOUNTER — Ambulatory Visit (INDEPENDENT_AMBULATORY_CARE_PROVIDER_SITE_OTHER): Payer: Medicare Other | Admitting: Adult Health

## 2023-08-09 DIAGNOSIS — F331 Major depressive disorder, recurrent, moderate: Secondary | ICD-10-CM

## 2023-08-09 DIAGNOSIS — G4700 Insomnia, unspecified: Secondary | ICD-10-CM | POA: Diagnosis not present

## 2023-08-09 DIAGNOSIS — F411 Generalized anxiety disorder: Secondary | ICD-10-CM

## 2023-08-09 MED ORDER — TRAZODONE HCL 50 MG PO TABS: 50.0000 mg | ORAL_TABLET | Freq: Every day | ORAL | 2 refills | Status: DC

## 2023-08-09 MED ORDER — DULOXETINE HCL 30 MG PO CPEP: 30.0000 mg | ORAL_CAPSULE | Freq: Every day | ORAL | 2 refills | Status: DC

## 2023-08-09 NOTE — Progress Notes (Signed)
Kimberly Stone [id number] 1946-09-15 77 y.o.  Subjective:   Patient ID:  Kimberly Stone is a 77 y.o. (DOB 1946/03/27) female.  Chief Complaint: No chief complaint on file.   HPI Kimberly Stone presents to the office today for follow-up of MDD  Describes mood today as "better". Pleasant. Denies tearfulness. Mood symptoms - reports decreased depression. Reports anxiety and irritability. Denies recent panic attacks. Reports worry, rumination and over thinking. Denies obsessive thoughts and acts. Reports improved interest and motivation. Mood has improved. Stating "I feel like I'm doing better". Reports family also noticing an improvement. Feels like medication changes have been helpful, but would like to try a higher dose of medication. Taking medications as prescribed.  Energy levels improved. Active, does not have a regular exercise routine. Enjoys some usual interests and activities. Married. Lives with husband. Has 2 sons - 44 and 102 - lost her daughter at age 52. Has 5 grandchildren. Spending time with family.  Appetite adequate. Gastric bypass in 2003. Weight stable - 124 pounds. Reports sleeping. Averages 10 hours of broken sleep - not getting into a deep sleep. Napping an hour a day. Focus and concentration stable. Completing tasks. Managing aspects of household. Retired - owns an assistive living facility center. Denies SI or HI.  Denies AH or VH. Denies self harm. Denies substance use.   Previous medication trials: Cymbalta, Wellbutrin GAD-7    Flowsheet Row Counselor from 06/12/2023 in [facility]  Total GAD-7 Score 10      PHQ2-9    Flowsheet Row Counselor from 06/12/2023 in [facility]  PHQ-2 Total Score 4  PHQ-9 Total Score 17      Flowsheet Row FERAHEME from 04/11/2021 in [facility]  C-SSRS RISK CATEGORY No Risk        Review of Systems:  Review of Systems   Musculoskeletal:  Negative for gait problem.  Neurological:  Negative for tremors.  Psychiatric/Behavioral:         Please refer to HPI    Medications: I have reviewed the patient's current medications.  Current Outpatient Medications  Medication Sig Dispense Refill   DULoxetine (CYMBALTA) 30 MG capsule Take 1 capsule (30 mg total) by mouth daily. 30 capsule 2   traZODone (DESYREL) 50 MG tablet Take 1 tablet (50 mg total) by mouth at bedtime. 30 tablet 2   acetaminophen (TYLENOL) 650 MG CR tablet Take 650 mg by mouth as needed for pain.      amLODipine (NORVASC) 5 MG tablet Take 5 mg by mouth at bedtime.      buPROPion (WELLBUTRIN XL) 300 MG 24 hr tablet Take 1 tablet (300 mg total) by mouth daily. 30 tablet 2   clopidogrel (PLAVIX) 75 MG tablet TAKE 1 TABLET (75 MG TOTAL) BY MOUTH DAILY. PLEASE SCHEDULE APPOINTMENT FOR REFILLS. 7 tablet 0   DULoxetine (CYMBALTA) 60 MG capsule Take 1 capsule (60 mg total) by mouth daily. 30 capsule 2   enalapril (VASOTEC) 5 MG tablet Take 5 mg by mouth at bedtime.      hydrOXYzine (ATARAX) 25 MG tablet Take one to tablets at bedtime as needed for sleep. 60 tablet 2   metoCLOPramide (REGLAN) 5 MG tablet Take 5 mg by mouth every 6 (six) hours as needed.      Multiple Vitamin (MULTIVITAMIN WITH MINERALS) TABS Take 2 tablets by mouth daily.     pantoprazole (PROTONIX) 40 MG tablet Take 40 mg by mouth  daily with breakfast.     PRESCRIPTION MEDICATION Inject 1 application into the muscle. INJECTION every 21-30 days     Vitamin D, Ergocalciferol, (DRISDOL) 50000 UNITS CAPS Take 50,000 Units by mouth every Monday, Wednesday, and Friday.      No current facility-administered medications for this visit.    Medication Side Effects: None  Allergies:  Allergies  Allergen Reactions   Aspirin Other (See Comments)    PT CANNOT TAKE DUE TO HX OF GASTRIC BYPASS SURGERY    Imodium [Loperamide] Palpitations   Statins     Other reaction(s): Other Joint pain    Erythromycin Other (See Comments)    UNKNOWN    Past Medical History:  Diagnosis Date   Acute MI (HCC) 11/2004   NONSTEMI 2005 NOINTERVENTION PER NOTE    Anemia    from bypass- iron and B12   Anxiety    Arthritis    Coronary artery disease    DDD (degenerative disc disease), cervical    Depression    DJD (degenerative joint disease)    Fibromyalgia    GERD (gastroesophageal reflux disease)    GIB (gastrointestinal bleeding)    while on celecoxib post bypass   Hyperlipidemia    Hyperlipidemia LDL goal < 130 01/23/2014   Hypertension    Obesity    hx of bipass, now resolved.  borderline diabetes also resolved.   Osteopenia    Post-menopausal    SCCA (squamous cell carcinoma) of skin 09/21/2015   left forearm medial cx3,60fu   SCCA (squamous cell carcinoma) of skin 07/26/2016   bridge of nose cx3 63fu   SCCA (squamous cell carcinoma) of skin 02/10/2020   right inner cheek tx after biopsy   SCCA (squamous cell carcinoma) of skin 02/10/2020   left hand tx after biopsy   Scoliosis    Sinusitis    Statin intolerance 01/23/2014    Past Medical History, Surgical history, Social history, and Family history were reviewed and updated as appropriate.   Please see review of systems for further details on the patient's review from today.   Objective:   Physical Exam:  LMP 03/18/1972 (Approximate)   Physical Exam Constitutional:      General: She is not in acute distress. Musculoskeletal:        General: No deformity.  Neurological:     Mental Status: She is alert and oriented to person, place, and time.     Coordination: Coordination normal.  Psychiatric:        Attention and Perception: Attention and perception normal. She does not perceive auditory or visual hallucinations.        Mood and Affect: Affect is not labile, blunt, angry or inappropriate.        Speech: Speech normal.        Behavior: Behavior normal.        Thought Content: Thought content normal. Thought  content is not paranoid or delusional. Thought content does not include homicidal or suicidal ideation. Thought content does not include homicidal or suicidal plan.        Cognition and Memory: Cognition and memory normal.        Judgment: Judgment normal.     Comments: Insight intact     Lab Review:     Component Value Date/Time   NA 132 (L) 06/13/2015 0254   K 4.8 06/13/2015 0254   CL 100 (L) 06/13/2015 0254   CO2 23 06/13/2015 0254   GLUCOSE 143 (H) 06/13/2015 0254  BUN 14 06/13/2015 0254   CREATININE 0.85 06/13/2015 0254   CALCIUM 9.0 06/13/2015 0254   PROT 6.3 (L) 06/13/2015 0254   ALBUMIN 3.1 (L) 06/13/2015 0254   AST 27 06/13/2015 0254   ALT 46 06/13/2015 0254   ALKPHOS 183 (H) 06/13/2015 0254   BILITOT 1.1 06/13/2015 0254   GFRNONAA >60 06/13/2015 0254   GFRAA >60 06/13/2015 0254       Component Value Date/Time   WBC 10.9 (H) 06/12/2015 0402   RBC 4.18 06/12/2015 0402   HGB 12.5 06/12/2015 0402   HCT 36.7 06/12/2015 0402   PLT 279 06/12/2015 0402   MCV 87.8 06/12/2015 0402   MCH 29.9 06/12/2015 0402   MCHC 34.1 06/12/2015 0402   RDW 13.2 06/12/2015 0402   LYMPHSABS 1.4 06/11/2015 0451   MONOABS 0.8 06/11/2015 0451   EOSABS 0.0 06/11/2015 0451   BASOSABS 0.0 06/11/2015 0451    No results found for: "POCLITH", "LITHIUM"   No results found for: "PHENYTOIN", "PHENOBARB", "VALPROATE", "CBMZ"   .res Assessment: Plan:    Plan:  PDMP reviewed  Wellbutrin XL 300mg  denies seizure history Increase Cymbalta 60mg  daily to 60mg  and 30mg  daily  D/C Hydroxyzine 25mg  - 1 to 2 at hs as needed for sleep  Add Trazdone 50mg  at hs - start off with 1/2 tablet  RTC 4 weeks  Patient advised to contact office with any questions, adverse effects, or acute worsening in signs and symptoms.   Diagnoses and all orders for this visit:  Major depressive disorder, recurrent episode, moderate (HCC)  Generalized anxiety disorder -     DULoxetine (CYMBALTA) 30 MG capsule;  Take 1 capsule (30 mg total) by mouth daily.  Insomnia, unspecified type -     traZODone (DESYREL) 50 MG tablet; Take 1 tablet (50 mg total) by mouth at bedtime.     Please see After Visit Summary for patient specific instructions.  Future Appointments  Date Time Provider Department Center  09/06/2023  9:40 AM Dario Yono, Thereasa Solo, NP [facility] None    No orders of the defined types were placed in this encounter.   -------------------------------

## 2023-08-14 DIAGNOSIS — R197 Diarrhea, unspecified: Secondary | ICD-10-CM | POA: Diagnosis not present

## 2023-08-14 DIAGNOSIS — Z800 Family history of malignant neoplasm of digestive organs: Secondary | ICD-10-CM | POA: Diagnosis not present

## 2023-08-16 DIAGNOSIS — R197 Diarrhea, unspecified: Secondary | ICD-10-CM | POA: Diagnosis not present

## 2023-08-21 ENCOUNTER — Telehealth: Payer: Self-pay | Admitting: Adult Health

## 2023-08-21 NOTE — Telephone Encounter (Signed)
Patient lvm at 9:00 stating Trazodone isn't working. Has been taking for about two weeks and started off taking half and that didn't work so she upped to fifty. She is waking up every night. Ph: [phone]

## 2023-08-22 NOTE — Telephone Encounter (Signed)
LVM to RC 

## 2023-08-23 NOTE — Telephone Encounter (Signed)
Called patient again. LVM to RC.

## 2023-08-23 NOTE — Telephone Encounter (Signed)
Left second VM to RC.  

## 2023-08-23 NOTE — Telephone Encounter (Signed)
Patient returned call and I was on the phone. Called her back and had to leave another VM.

## 2023-08-28 NOTE — Telephone Encounter (Signed)
Patient reporting she is not sleeping on trazodone 50 mg. She can get to sleep ok, but wakes up early. She is getting 6 hours of sleep, but wants to get 8.  Reviewed sleep hygiene - no afternoon caffeine, usual bedtime, she reads, watches TV, or crafts before bed. She is asking if there is anything that she can take when she wakes up early, or can she increase trazodone. She has FU with you 9/19, but would like to resolve the sleep issue before then.

## 2023-08-29 NOTE — Telephone Encounter (Signed)
Notified patient to take 75 mg and let us know on Monday how she is doing.

## 2023-09-05 ENCOUNTER — Telehealth: Payer: Self-pay | Admitting: Adult Health

## 2023-09-05 DIAGNOSIS — G4700 Insomnia, unspecified: Secondary | ICD-10-CM

## 2023-09-05 MED ORDER — TRAZODONE HCL 50 MG PO TABS
75.0000 mg | ORAL_TABLET | Freq: Every day | ORAL | 1 refills | Status: DC
Start: 2023-09-05 — End: 2023-10-05

## 2023-09-05 NOTE — Telephone Encounter (Signed)
Sent!

## 2023-09-05 NOTE — Telephone Encounter (Signed)
Pt called to request a RF on Trazodone. She has changed doses and is currently taking 50mg  1 and 1/2 at night. Please send in to: [id number] - [place], [place] - [address]

## 2023-09-06 ENCOUNTER — Ambulatory Visit (INDEPENDENT_AMBULATORY_CARE_PROVIDER_SITE_OTHER): Payer: Medicare Other | Admitting: Adult Health

## 2023-09-06 ENCOUNTER — Encounter: Payer: Self-pay | Admitting: Adult Health

## 2023-09-06 DIAGNOSIS — F411 Generalized anxiety disorder: Secondary | ICD-10-CM

## 2023-09-06 DIAGNOSIS — F331 Major depressive disorder, recurrent, moderate: Secondary | ICD-10-CM | POA: Diagnosis not present

## 2023-09-06 MED ORDER — BUPROPION HCL ER (XL) 300 MG PO TB24
300.0000 mg | ORAL_TABLET | Freq: Every day | ORAL | 1 refills | Status: DC
Start: 2023-09-06 — End: 2024-01-01

## 2023-09-06 MED ORDER — DULOXETINE HCL 30 MG PO CPEP
30.0000 mg | ORAL_CAPSULE | Freq: Every day | ORAL | 1 refills | Status: DC
Start: 2023-09-06 — End: 2024-01-01

## 2023-09-06 MED ORDER — DULOXETINE HCL 60 MG PO CPEP
60.0000 mg | ORAL_CAPSULE | Freq: Every day | ORAL | 1 refills | Status: DC
Start: 2023-09-06 — End: 2024-01-01

## 2023-09-06 NOTE — Progress Notes (Signed)
Kimberly Stone [id number] December 02, 1946 77 y.o.  Subjective:   Patient ID:  Kimberly Stone is a 77 y.o. (DOB 1946-09-09) female.  Chief Complaint: No chief complaint on file.   HPI WINTRESS BRYS presents to the office today for follow-up of MDD  Describes mood today as "better". Pleasant. Denies tearfulness. Mood symptoms - reports decreased depression - "not as much as I was". Reports anxiety and irritability "at times". Denies recent panic attacks. Reports some worry, rumination and over thinking. Denies obsessive thoughts and acts. Reports improved interest and motivation. Mood is consistent. Stating "I feel like I'm doing better". Feels like current medications are helpful. Taking medications as prescribed.  Energy levels improved. Active, does not have a regular exercise routine. Enjoys some usual interests and activities. Paint and sewing. Working in [employer]. Married. Lives with husband. Has 2 sons - 50 and 27 - lost her daughter at age 19. Has 5 grandchildren. Spending time with family.  Appetite adequate. Gastric bypass in 2003. Weight stable - 120 pounds. Reports sleeping better some nights than others. Averages 7 hours of broken sleep. Napping an hour a day. Focus and concentration stable. Completing tasks. Managing aspects of household. Retired - owns an assistive living facility center. Denies SI or HI.  Denies AH or VH. Denies self harm. Denies substance use.    Previous medication trials: Cymbalta, Wellbutrin   GAD-7    Flowsheet Row Counselor from 06/12/2023 in [facility]  Total GAD-7 Score 10      PHQ2-9    Flowsheet Row Counselor from 06/12/2023 in [facility]  PHQ-2 Total Score 4  PHQ-9 Total Score 17      Flowsheet Row FERAHEME from 04/11/2021 in [facility]  C-SSRS RISK CATEGORY No Risk        Review of Systems:  Review of Systems  Musculoskeletal:   Negative for gait problem.  Neurological:  Negative for tremors.  Psychiatric/Behavioral:         Please refer to HPI    Medications: I have reviewed the patient's current medications.  Current Outpatient Medications  Medication Sig Dispense Refill   acetaminophen (TYLENOL) 650 MG CR tablet Take 650 mg by mouth as needed for pain.      amLODipine (NORVASC) 5 MG tablet Take 5 mg by mouth at bedtime.      buPROPion (WELLBUTRIN XL) 300 MG 24 hr tablet Take 1 tablet (300 mg total) by mouth daily. 90 tablet 1   clopidogrel (PLAVIX) 75 MG tablet TAKE 1 TABLET (75 MG TOTAL) BY MOUTH DAILY. PLEASE SCHEDULE APPOINTMENT FOR REFILLS. 7 tablet 0   DULoxetine (CYMBALTA) 30 MG capsule Take 1 capsule (30 mg total) by mouth daily. 90 capsule 1   DULoxetine (CYMBALTA) 60 MG capsule Take 1 capsule (60 mg total) by mouth daily. 90 capsule 1   enalapril (VASOTEC) 5 MG tablet Take 5 mg by mouth at bedtime.      hydrOXYzine (ATARAX) 25 MG tablet Take one to tablets at bedtime as needed for sleep. 60 tablet 2   metoCLOPramide (REGLAN) 5 MG tablet Take 5 mg by mouth every 6 (six) hours as needed.      Multiple Vitamin (MULTIVITAMIN WITH MINERALS) TABS Take 2 tablets by mouth daily.     pantoprazole (PROTONIX) 40 MG tablet Take 40 mg by mouth daily with breakfast.     PRESCRIPTION MEDICATION Inject 1 application into the muscle. INJECTION every 21-30 days  traZODone (DESYREL) 50 MG tablet Take 1.5 tablets (75 mg total) by mouth at bedtime. 45 tablet 1   Vitamin D, Ergocalciferol, (DRISDOL) 50000 UNITS CAPS Take 50,000 Units by mouth every Monday, Wednesday, and Friday.      No current facility-administered medications for this visit.    Medication Side Effects: None  Allergies:  Allergies  Allergen Reactions   Aspirin Other (See Comments)    PT CANNOT TAKE DUE TO HX OF GASTRIC BYPASS SURGERY    Imodium [Loperamide] Palpitations   Statins     Other reaction(s): Other Joint pain   Erythromycin Other  (See Comments)    UNKNOWN    Past Medical History:  Diagnosis Date   Acute MI (HCC) 11/2004   NONSTEMI 2005 NOINTERVENTION PER NOTE    Anemia    from bypass- iron and B12   Anxiety    Arthritis    Coronary artery disease    DDD (degenerative disc disease), cervical    Depression    DJD (degenerative joint disease)    Fibromyalgia    GERD (gastroesophageal reflux disease)    GIB (gastrointestinal bleeding)    while on celecoxib post bypass   Hyperlipidemia    Hyperlipidemia LDL goal < 130 01/23/2014   Hypertension    Obesity    hx of bipass, now resolved.  borderline diabetes also resolved.   Osteopenia    Post-menopausal    SCCA (squamous cell carcinoma) of skin 09/21/2015   left forearm medial cx3,33fu   SCCA (squamous cell carcinoma) of skin 07/26/2016   bridge of nose cx3 73fu   SCCA (squamous cell carcinoma) of skin 02/10/2020   right inner cheek tx after biopsy   SCCA (squamous cell carcinoma) of skin 02/10/2020   left hand tx after biopsy   Scoliosis    Sinusitis    Statin intolerance 01/23/2014    Past Medical History, Surgical history, Social history, and Family history were reviewed and updated as appropriate.   Please see review of systems for further details on the patient's review from today.   Objective:   Physical Exam:  LMP 03/18/1972 (Approximate)   Physical Exam Constitutional:      General: She is not in acute distress. Musculoskeletal:        General: No deformity.  Neurological:     Mental Status: She is alert and oriented to person, place, and time.     Coordination: Coordination normal.  Psychiatric:        Attention and Perception: Attention and perception normal. She does not perceive auditory or visual hallucinations.        Mood and Affect: Affect is not labile, blunt, angry or inappropriate.        Speech: Speech normal.        Behavior: Behavior normal.        Thought Content: Thought content normal. Thought content is not paranoid  or delusional. Thought content does not include homicidal or suicidal ideation. Thought content does not include homicidal or suicidal plan.        Cognition and Memory: Cognition and memory normal.        Judgment: Judgment normal.     Comments: Insight intact     Lab Review:     Component Value Date/Time   NA 132 (L) 06/13/2015 0254   K 4.8 06/13/2015 0254   CL 100 (L) 06/13/2015 0254   CO2 23 06/13/2015 0254   GLUCOSE 143 (H) 06/13/2015 0254   BUN 14 06/13/2015  0254   CREATININE 0.85 06/13/2015 0254   CALCIUM 9.0 06/13/2015 0254   PROT 6.3 (L) 06/13/2015 0254   ALBUMIN 3.1 (L) 06/13/2015 0254   AST 27 06/13/2015 0254   ALT 46 06/13/2015 0254   ALKPHOS 183 (H) 06/13/2015 0254   BILITOT 1.1 06/13/2015 0254   GFRNONAA >60 06/13/2015 0254   GFRAA >60 06/13/2015 0254       Component Value Date/Time   WBC 10.9 (H) 06/12/2015 0402   RBC 4.18 06/12/2015 0402   HGB 12.5 06/12/2015 0402   HCT 36.7 06/12/2015 0402   PLT 279 06/12/2015 0402   MCV 87.8 06/12/2015 0402   MCH 29.9 06/12/2015 0402   MCHC 34.1 06/12/2015 0402   RDW 13.2 06/12/2015 0402   LYMPHSABS 1.4 06/11/2015 0451   MONOABS 0.8 06/11/2015 0451   EOSABS 0.0 06/11/2015 0451   BASOSABS 0.0 06/11/2015 0451    No results found for: "POCLITH", "LITHIUM"   No results found for: "PHENYTOIN", "PHENOBARB", "VALPROATE", "CBMZ"   .res Assessment: Plan:    Plan:  PDMP reviewed  Wellbutrin XL 300mg  denies seizure history Cymbalta 60mg  daily to 60mg  and 30mg  daily Trazdone 50mg  at hs  - working on increasing dose - may increase up to 100mg  at hs  RTC 4 weeks  Patient advised to contact office with any questions, adverse effects, or acute worsening in signs and symptoms.  Diagnoses and all orders for this visit:  Major depressive disorder, recurrent episode, moderate (HCC) -     buPROPion (WELLBUTRIN XL) 300 MG 24 hr tablet; Take 1 tablet (300 mg total) by mouth daily. -     DULoxetine (CYMBALTA) 60 MG  capsule; Take 1 capsule (60 mg total) by mouth daily.  Generalized anxiety disorder -     buPROPion (WELLBUTRIN XL) 300 MG 24 hr tablet; Take 1 tablet (300 mg total) by mouth daily. -     DULoxetine (CYMBALTA) 30 MG capsule; Take 1 capsule (30 mg total) by mouth daily. -     DULoxetine (CYMBALTA) 60 MG capsule; Take 1 capsule (60 mg total) by mouth daily.     Please see After Visit Summary for patient specific instructions.  No future appointments.   No orders of the defined types were placed in this encounter.   -------------------------------

## 2023-09-17 ENCOUNTER — Telehealth: Payer: Self-pay | Admitting: Adult Health

## 2023-09-17 NOTE — Telephone Encounter (Signed)
Patient was prescribed 75 mg of trazodone. Reports she is taking 50 mg and if she wakes up between 2-3:00 she will take another 50 mg.  ? Ok to continue this dose.

## 2023-09-17 NOTE — Telephone Encounter (Signed)
Fyi patient lvm stating that she is taking 50mg  of Trazadone at night before and when she wakes up between 2-3 she take another 50mg . It is working pretty good and wanted to let you know the changes she made Ph: [phone]

## 2023-09-18 NOTE — Telephone Encounter (Signed)
Will need to update Rx for trazodone when RF requested for 2 every day.

## 2023-09-24 ENCOUNTER — Telehealth: Payer: Self-pay | Admitting: *Deleted

## 2023-09-24 NOTE — Telephone Encounter (Signed)
   Pre-operative Risk Assessment    Patient Name: Kimberly Stone  DOB: 01-06-1946 MRN: [id number]    DATE OF LAST VISIT: 01/24/23 DR. CROITORU DATE OF NEXT VISIT: NONE  Request for Surgical Clearance    Procedure:   COLONOSCOPY  Date of Surgery:  Clearance 10/03/23                                 Surgeon:  DR. [facility] Surgeon's Group or Practice Name:  [facility] Phone number:  [phone] Fax number:  [phone]   Type of Clearance Requested:   - Medical  - Pharmacy:  Hold Clopidogrel (Plavix)     Type of Anesthesia:   PROPOFOL   Additional requests/questions:    Elpidio Anis   09/24/2023, 5:18 PM

## 2023-09-25 NOTE — Telephone Encounter (Signed)
   Name: Kimberly Stone  DOB: [birth year]  MRN: [id number]  Primary Cardiologist: Thurmon Fair, MD   Preoperative team, please contact this patient and set up a phone call appointment for further preoperative risk assessment. Please obtain consent and complete medication review. Thank you for your help.  Saw Dr.Croitoru on 02/13/2023.   I confirm that guidance regarding antiplatelet and oral anticoagulation therapy has been completed and, if necessary, noted below.  Per office protocol, if patient is without any new symptoms or concerns at the time of their virtual visit, he/she may hold Plavix for 5 days prior to procedure. Please resume Plavix as soon as possible postprocedure, at the discretion of the surgeon.    I also confirmed the patient resides in the state of [place]. As per [facility] Medical Board telemedicine laws, the patient must reside in the state in which the provider is licensed.   Joni Reining, NP 09/25/2023, 11:54 AM [facility]

## 2023-09-25 NOTE — Telephone Encounter (Signed)
Please add her on to provider slot. She will need to hold the Plavix for 5 days prior to the procedure.

## 2023-09-25 NOTE — Telephone Encounter (Signed)
1st attempt to reach pt regarding surgical clearance and the need for a tele visit.  Will have to be double booked tomorrow 10/9 due to surgery 10/16 and need to hold Plavix X's 5 days.  Left a message to call back and ask for the preop call back.

## 2023-09-26 ENCOUNTER — Ambulatory Visit: Payer: Medicare Other | Attending: Nurse Practitioner | Admitting: Nurse Practitioner

## 2023-09-26 ENCOUNTER — Telehealth: Payer: Self-pay | Admitting: *Deleted

## 2023-09-26 DIAGNOSIS — Z01810 Encounter for preprocedural cardiovascular examination: Secondary | ICD-10-CM

## 2023-09-26 NOTE — Telephone Encounter (Signed)
Per pre op APP [name], NP add on due to procedure date and med hold. Med rec and consent are done.

## 2023-09-26 NOTE — Progress Notes (Signed)
Virtual Visit via Telephone Note   Because of Kimberly Stone co-morbid illnesses, she is at least at moderate risk for complications without adequate follow up.  This format is felt to be most appropriate for this patient at this time.  The patient did not have access to video technology/had technical difficulties with video requiring transitioning to audio format only (telephone).  All issues noted in this document were discussed and addressed.  No physical exam could be performed with this format.  Please refer to the patient's chart for her consent to telehealth for [facility].  Evaluation Performed:  Preoperative cardiovascular risk assessment _____________   Date:  09/26/2023   Patient ID:  Kimberly Stone, DOB [birth year], MRN [id number] Patient Location:  Home Provider location:   Office  Primary Care Provider:  Rodrigo Ran, MD Primary Cardiologist:  Thurmon Fair, MD  Chief Complaint / Patient Profile   77 y.o. y/o female with a h/o CAD, LBBB, aortic stenosis, hypertension, hyperlipidemia, prediabetes, GERD, and obesity s/p gastric bypass surgery who is pending colonoscopy on 10/03/2023 with Dr. Marca Ancona of [facility] and presents today for telephonic preoperative cardiovascular risk assessment.  History of Present Illness    Kimberly Stone is a 77 y.o. female who presents via audio/video conferencing for a telehealth visit today.  Pt was last seen in cardiology clinic on 01/24/2023 by Dr. Royann Shivers.  At that time Kimberly Stone was doing well.  The patient is now pending procedure as outlined above. Since her last visit, she has done well from a cardiac standpoint.   She denies chest pain, palpitations, dyspnea, pnd, orthopnea, n, v, dizziness, syncope, edema, weight gain, or early satiety. All other systems reviewed and are otherwise negative except as noted above.   Past Medical History    Past Medical History:  Diagnosis Date   Acute MI (HCC) 11/2004    NONSTEMI 2005 NOINTERVENTION PER NOTE    Anemia    from bypass- iron and B12   Anxiety    Arthritis    Coronary artery disease    DDD (degenerative disc disease), cervical    Depression    DJD (degenerative joint disease)    Fibromyalgia    GERD (gastroesophageal reflux disease)    GIB (gastrointestinal bleeding)    while on celecoxib post bypass   Hyperlipidemia    Hyperlipidemia LDL goal < 130 01/23/2014   Hypertension    Obesity    hx of bipass, now resolved.  borderline diabetes also resolved.   Osteopenia    Post-menopausal    SCCA (squamous cell carcinoma) of skin 09/21/2015   left forearm medial cx3,67fu   SCCA (squamous cell carcinoma) of skin 07/26/2016   bridge of nose cx3 27fu   SCCA (squamous cell carcinoma) of skin 02/10/2020   right inner cheek tx after biopsy   SCCA (squamous cell carcinoma) of skin 02/10/2020   left hand tx after biopsy   Scoliosis    Sinusitis    Statin intolerance 01/23/2014   Past Surgical History:  Procedure Laterality Date   CARDIAC CATHETERIZATION     2005 Severely disease small optional diagonal vessel responsible far wall motion abnormalities wise to small and diffusely diseased for consideration of interventio.   CATARACT EXTRACTION     COLONOSCOPY     GASTRIC BYPASS  01/2004   KNEE ARTHROSCOPY Right    LAPAROSCOPIC CHOLECYSTECTOMY  1993   TOTAL VAGINAL HYSTERECTOMY  1973   secondary to DUB - Dr. Roberto Scales  and Dr. Katrinka Blazing    Allergies  Allergies  Allergen Reactions   Aspirin Other (See Comments)    PT CANNOT TAKE DUE TO HX OF GASTRIC BYPASS SURGERY    Imodium [Loperamide] Palpitations   Statins     Other reaction(s): Other Joint pain   Erythromycin Other (See Comments)    UNKNOWN    Home Medications    Prior to Admission medications   Medication Sig Start Date End Date Taking? Authorizing Provider  acetaminophen (TYLENOL) 650 MG CR tablet Take 650 mg by mouth as needed for pain.  12/03/14   [provider]   amLODipine (NORVASC) 5 MG tablet Take 5 mg by mouth at bedtime.     [provider]  buPROPion (WELLBUTRIN XL) 300 MG 24 hr tablet Take 1 tablet (300 mg total) by mouth daily. 09/06/23   Mozingo, Thereasa Solo, NP  clopidogrel (PLAVIX) 75 MG tablet TAKE 1 TABLET (75 MG TOTAL) BY MOUTH DAILY. PLEASE SCHEDULE APPOINTMENT FOR REFILLS. 08/27/17   Croitoru, Mihai, MD  CREON [id number] units CPEP capsule Take by mouth. AS DIRECTED 08/21/23   [provider]  DULoxetine (CYMBALTA) 30 MG capsule Take 1 capsule (30 mg total) by mouth daily. 09/06/23   Mozingo, Thereasa Solo, NP  DULoxetine (CYMBALTA) 60 MG capsule Take 1 capsule (60 mg total) by mouth daily. 09/06/23   Mozingo, Thereasa Solo, NP  enalapril (VASOTEC) 5 MG tablet Take 5 mg by mouth at bedtime.     [provider]  hydrOXYzine (ATARAX) 25 MG tablet Take one to tablets at bedtime as needed for sleep. 07/05/23   Mozingo, Thereasa Solo, NP  metoCLOPramide (REGLAN) 5 MG tablet Take 5 mg by mouth every 6 (six) hours as needed.     [provider]  Multiple Vitamin (MULTIVITAMIN WITH MINERALS) TABS Take 2 tablets by mouth daily.    [provider]  pantoprazole (PROTONIX) 40 MG tablet Take 40 mg by mouth daily with breakfast.    [provider]  PRESCRIPTION MEDICATION Inject 1 application into the muscle. INJECTION every 21-30 days    [provider]  traZODone (DESYREL) 50 MG tablet Take 1.5 tablets (75 mg total) by mouth at bedtime. 09/05/23   Mozingo, Thereasa Solo, NP  Vitamin D, Ergocalciferol, (DRISDOL) 50000 UNITS CAPS Take 50,000 Units by mouth every Monday, Wednesday, and Friday.     [provider]    Physical Exam    Vital Signs:  Kimberly Stone does not have vital signs available for review today.  Given telephonic nature of communication, physical exam is limited. AAOx3. NAD. Normal affect.  Speech and respirations are unlabored.  Accessory Clinical  Findings    None  Assessment & Plan    1.  Preoperative Cardiovascular Risk Assessment:  According to the Revised Cardiac Risk Index (RCRI), her Perioperative Risk of Major Cardiac Event is (%): 0.9. Her Functional Capacity in METs is: 6.36 according to the Duke Activity Status Index (DASI). Therefore, based on ACC/AHA guidelines, patient would be at acceptable risk for the planned procedure without further cardiovascular testing.   The patient was advised that if she develops new symptoms prior to surgery to contact our office to arrange for a follow-up visit, and she verbalized understanding.  Per office protocol, she may hold Plavix for 5 days prior to procedure. Please resume Plavix as soon as possible postprocedure, at the discretion of the surgeon.      A copy of this note will be routed to  requesting surgeon.  Time:   Today, I have spent 5 minutes with the patient with telehealth technology discussing medical history, symptoms, and management plan.     Joylene Grapes, NP  09/26/2023, 11:25 AM

## 2023-09-26 NOTE — Telephone Encounter (Signed)
Per pre op APP [name], NP add on due to procedure date and med hold. Med rec and consent are done.      Patient Consent for Virtual Visit        Kimberly Stone has provided verbal consent on 09/26/2023 for a virtual visit (video or telephone).   CONSENT FOR VIRTUAL VISIT FOR:  Kimberly Stone  By participating in this virtual visit I agree to the following:  I hereby voluntarily request, consent and authorize [facility] and its employed or contracted physicians, physician assistants, nurse practitioners or other licensed health care professionals (the Practitioner), to provide me with telemedicine health care services (the "Services") as deemed necessary by the treating Practitioner. I acknowledge and consent to receive the Services by the Practitioner via telemedicine. I understand that the telemedicine visit will involve communicating with the Practitioner through live audiovisual communication technology and the disclosure of certain medical information by electronic transmission. I acknowledge that I have been given the opportunity to request an in-person assessment or other available alternative prior to the telemedicine visit and am voluntarily participating in the telemedicine visit.  I understand that I have the right to withhold or withdraw my consent to the use of telemedicine in the course of my care at any time, without affecting my right to future care or treatment, and that the Practitioner or I may terminate the telemedicine visit at any time. I understand that I have the right to inspect all information obtained and/or recorded in the course of the telemedicine visit and may receive copies of available information for a reasonable fee.  I understand that some of the potential risks of receiving the Services via telemedicine include:  Delay or interruption in medical evaluation due to technological equipment failure or disruption; Information transmitted may not be  sufficient (e.g. poor resolution of images) to allow for appropriate medical decision making by the Practitioner; and/or  In rare instances, security protocols could fail, causing a breach of personal health information.  Furthermore, I acknowledge that it is my responsibility to provide information about my medical history, conditions and care that is complete and accurate to the best of my ability. I acknowledge that Practitioner's advice, recommendations, and/or decision may be based on factors not within their control, such as incomplete or inaccurate data provided by me or distortions of diagnostic images or specimens that may result from electronic transmissions. I understand that the practice of medicine is not an exact science and that Practitioner makes no warranties or guarantees regarding treatment outcomes. I acknowledge that a copy of this consent can be made available to me via my patient portal [facility] MyChart), or I can request a printed copy by calling the office of [facility].    I understand that my insurance will be billed for this visit.   I have read or had this consent read to me. I understand the contents of this consent, which adequately explains the benefits and risks of the Services being provided via telemedicine.  I have been provided ample opportunity to ask questions regarding this consent and the Services and have had my questions answered to my satisfaction. I give my informed consent for the services to be provided through the use of telemedicine in my medical care

## 2023-10-03 DIAGNOSIS — K5140 Inflammatory polyps of colon without complications: Secondary | ICD-10-CM | POA: Diagnosis not present

## 2023-10-03 DIAGNOSIS — Q438 Other specified congenital malformations of intestine: Secondary | ICD-10-CM | POA: Diagnosis not present

## 2023-10-03 DIAGNOSIS — R197 Diarrhea, unspecified: Secondary | ICD-10-CM | POA: Diagnosis not present

## 2023-10-03 DIAGNOSIS — K648 Other hemorrhoids: Secondary | ICD-10-CM | POA: Diagnosis not present

## 2023-10-03 DIAGNOSIS — Z800 Family history of malignant neoplasm of digestive organs: Secondary | ICD-10-CM | POA: Diagnosis not present

## 2023-10-03 DIAGNOSIS — K6389 Other specified diseases of intestine: Secondary | ICD-10-CM | POA: Diagnosis not present

## 2023-10-03 DIAGNOSIS — K5730 Diverticulosis of large intestine without perforation or abscess without bleeding: Secondary | ICD-10-CM | POA: Diagnosis not present

## 2023-10-05 ENCOUNTER — Encounter: Payer: Self-pay | Admitting: Adult Health

## 2023-10-05 ENCOUNTER — Ambulatory Visit: Payer: Medicare Other | Admitting: Adult Health

## 2023-10-05 DIAGNOSIS — G4700 Insomnia, unspecified: Secondary | ICD-10-CM | POA: Diagnosis not present

## 2023-10-05 DIAGNOSIS — F331 Major depressive disorder, recurrent, moderate: Secondary | ICD-10-CM

## 2023-10-05 DIAGNOSIS — F411 Generalized anxiety disorder: Secondary | ICD-10-CM

## 2023-10-05 MED ORDER — TRAZODONE HCL 50 MG PO TABS
75.0000 mg | ORAL_TABLET | Freq: Every day | ORAL | 1 refills | Status: DC
Start: 2023-10-05 — End: 2024-01-01

## 2023-10-05 NOTE — Progress Notes (Signed)
Kimberly Stone [id number] 17-Jul-1946 77 y.o.  Subjective:   Patient ID:  Kimberly Stone is a 77 y.o. (DOB Jul 19, 1946) female.  Chief Complaint: No chief complaint on file.   HPI Kimberly Stone presents to the office today for follow-up of MDD  Describes mood today as "better". Pleasant. Denies tearfulness. Mood symptoms - reports decreased depression - "not as much as I was". Reports anxiety and irritability "at times". Reports recent panic attacks - election related. Reports some worry, rumination and over thinking. Denies obsessive thoughts and acts. Reports improved interest and motivation. Mood is consistent. Stating "I feel like I'm doing alright". Feels like current medications are helpful. Taking medications as prescribed.  Energy levels stable. Active, does not have a regular exercise routine. Enjoys some usual interests and activities. Paint and sewing. Working in [employer]. Married. Lives with husband. Has 2 sons - 34 and 80 - lost her daughter at age 51. Has 5 grandchildren. Spending time with family.  Appetite adequate. Gastric bypass in 2003. Weight loss - 114 from 120 pounds. Reports sleeping better some nights than others. Averages 7 hours of broken sleep. Napping an hour a day. Focus and concentration difficulties - "jumping from one task to the other". Completing tasks. Managing aspects of household. Retired - owns an assistive living facility center. Denies SI or HI.  Denies AH or VH. Denies self harm. Denies substance use.    Previous medication trials: Cymbalta, Wellbutrin    GAD-7    Flowsheet Row Counselor from 06/12/2023 in [facility]  Total GAD-7 Score 10      PHQ2-9    Flowsheet Row Counselor from 06/12/2023 in [facility]  PHQ-2 Total Score 4  PHQ-9 Total Score 17      Flowsheet Row FERAHEME from 04/11/2021 in [facility]  C-SSRS RISK CATEGORY No Risk         Review of Systems:  Review of Systems  Musculoskeletal:  Negative for gait problem.  Neurological:  Negative for tremors.  Psychiatric/Behavioral:         Please refer to HPI    Medications: I have reviewed the patient's current medications.  Current Outpatient Medications  Medication Sig Dispense Refill   acetaminophen (TYLENOL) 650 MG CR tablet Take 650 mg by mouth as needed for pain.      amLODipine (NORVASC) 5 MG tablet Take 5 mg by mouth at bedtime.      buPROPion (WELLBUTRIN XL) 300 MG 24 hr tablet Take 1 tablet (300 mg total) by mouth daily. 90 tablet 1   clopidogrel (PLAVIX) 75 MG tablet TAKE 1 TABLET (75 MG TOTAL) BY MOUTH DAILY. PLEASE SCHEDULE APPOINTMENT FOR REFILLS. 7 tablet 0   CREON 36000-114000 units CPEP capsule Take by mouth. AS DIRECTED     DULoxetine (CYMBALTA) 30 MG capsule Take 1 capsule (30 mg total) by mouth daily. 90 capsule 1   DULoxetine (CYMBALTA) 60 MG capsule Take 1 capsule (60 mg total) by mouth daily. 90 capsule 1   enalapril (VASOTEC) 5 MG tablet Take 5 mg by mouth at bedtime.      hydrOXYzine (ATARAX) 25 MG tablet Take one to tablets at bedtime as needed for sleep. 60 tablet 2   metoCLOPramide (REGLAN) 5 MG tablet Take 5 mg by mouth every 6 (six) hours as needed.      Multiple Vitamin (MULTIVITAMIN WITH MINERALS) TABS Take 2 tablets by mouth daily.     pantoprazole (  PROTONIX) 40 MG tablet Take 40 mg by mouth daily with breakfast.     PRESCRIPTION MEDICATION Inject 1 application into the muscle. INJECTION every 21-30 days     traZODone (DESYREL) 50 MG tablet Take 1.5 tablets (75 mg total) by mouth at bedtime. 45 tablet 1   Vitamin D, Ergocalciferol, (DRISDOL) 50000 UNITS CAPS Take 50,000 Units by mouth every Monday, Wednesday, and Friday.      No current facility-administered medications for this visit.    Medication Side Effects: None  Allergies:  Allergies  Allergen Reactions   Aspirin Other (See Comments)    PT CANNOT TAKE DUE TO HX  OF GASTRIC BYPASS SURGERY    Imodium [Loperamide] Palpitations   Statins     Other reaction(s): Other Joint pain   Erythromycin Other (See Comments)    UNKNOWN    Past Medical History:  Diagnosis Date   Acute MI (HCC) 11/2004   NONSTEMI 2005 NOINTERVENTION PER NOTE    Anemia    from bypass- iron and B12   Anxiety    Arthritis    Coronary artery disease    DDD (degenerative disc disease), cervical    Depression    DJD (degenerative joint disease)    Fibromyalgia    GERD (gastroesophageal reflux disease)    GIB (gastrointestinal bleeding)    while on celecoxib post bypass   Hyperlipidemia    Hyperlipidemia LDL goal < 130 01/23/2014   Hypertension    Obesity    hx of bipass, now resolved.  borderline diabetes also resolved.   Osteopenia    Post-menopausal    SCCA (squamous cell carcinoma) of skin 09/21/2015   left forearm medial cx3,61fu   SCCA (squamous cell carcinoma) of skin 07/26/2016   bridge of nose cx3 5fu   SCCA (squamous cell carcinoma) of skin 02/10/2020   right inner cheek tx after biopsy   SCCA (squamous cell carcinoma) of skin 02/10/2020   left hand tx after biopsy   Scoliosis    Sinusitis    Statin intolerance 01/23/2014    Past Medical History, Surgical history, Social history, and Family history were reviewed and updated as appropriate.   Please see review of systems for further details on the patient's review from today.   Objective:   Physical Exam:  LMP 03/18/1972 (Approximate)   Physical Exam Constitutional:      General: She is not in acute distress. Musculoskeletal:        General: No deformity.  Neurological:     Mental Status: She is alert and oriented to person, place, and time.     Coordination: Coordination normal.  Psychiatric:        Attention and Perception: Attention and perception normal. She does not perceive auditory or visual hallucinations.        Mood and Affect: Affect is not labile, blunt, angry or inappropriate.         Speech: Speech normal.        Behavior: Behavior normal.        Thought Content: Thought content normal. Thought content is not paranoid or delusional. Thought content does not include homicidal or suicidal ideation. Thought content does not include homicidal or suicidal plan.        Cognition and Memory: Cognition and memory normal.        Judgment: Judgment normal.     Comments: Insight intact     Lab Review:     Component Value Date/Time   NA 132 (L) 06/13/2015  0254   K 4.8 06/13/2015 0254   CL 100 (L) 06/13/2015 0254   CO2 23 06/13/2015 0254   GLUCOSE 143 (H) 06/13/2015 0254   BUN 14 06/13/2015 0254   CREATININE 0.85 06/13/2015 0254   CALCIUM 9.0 06/13/2015 0254   PROT 6.3 (L) 06/13/2015 0254   ALBUMIN 3.1 (L) 06/13/2015 0254   AST 27 06/13/2015 0254   ALT 46 06/13/2015 0254   ALKPHOS 183 (H) 06/13/2015 0254   BILITOT 1.1 06/13/2015 0254   GFRNONAA >60 06/13/2015 0254   GFRAA >60 06/13/2015 0254       Component Value Date/Time   WBC 10.9 (H) 06/12/2015 0402   RBC 4.18 06/12/2015 0402   HGB 12.5 06/12/2015 0402   HCT 36.7 06/12/2015 0402   PLT 279 06/12/2015 0402   MCV 87.8 06/12/2015 0402   MCH 29.9 06/12/2015 0402   MCHC 34.1 06/12/2015 0402   RDW 13.2 06/12/2015 0402   LYMPHSABS 1.4 06/11/2015 0451   MONOABS 0.8 06/11/2015 0451   EOSABS 0.0 06/11/2015 0451   BASOSABS 0.0 06/11/2015 0451    No results found for: "POCLITH", "LITHIUM"   No results found for: "PHENYTOIN", "PHENOBARB", "VALPROATE", "CBMZ"   .res Assessment: Plan:    Plan:  PDMP reviewed  Wellbutrin XL 300mg  denies seizure history Cymbalta   60mg  and 30mg  daily Trazdone 50mg  at hs  - working on increasing dose - may increase up to 100mg  at hs  RTC 3 months  Patient advised to contact office with any questions, adverse effects, or acute worsening in signs and symptoms.   There are no diagnoses linked to this encounter.   Please see After Visit Summary for patient specific  instructions.  No future appointments.  No orders of the defined types were placed in this encounter.   -------------------------------

## 2023-10-24 ENCOUNTER — Telehealth: Payer: Self-pay | Admitting: Adult Health

## 2023-10-24 NOTE — Telephone Encounter (Signed)
Pt lvm for a phone call about her medication. Please call her at [phone]

## 2023-10-24 NOTE — Telephone Encounter (Signed)
Patient reporting she has recently had 2 chipped teeth. Her dentist asked her if she had dry mouth and she said she did. She is asking if any of her meds are potentially causing this. She knows to drink more fluids. She said she doesn't want to change any of her meds because she is doing so well, but wanted to know if you had any other suggestions.

## 2023-10-25 NOTE — Telephone Encounter (Signed)
Notified patient. Told her that she could use Biotene mouth rinse, as well as increasing fluid intake.

## 2023-11-27 DIAGNOSIS — H26491 Other secondary cataract, right eye: Secondary | ICD-10-CM | POA: Diagnosis not present

## 2023-11-27 DIAGNOSIS — H401232 Low-tension glaucoma, bilateral, moderate stage: Secondary | ICD-10-CM | POA: Diagnosis not present

## 2023-12-05 DIAGNOSIS — M130 Polyarthritis, unspecified: Secondary | ICD-10-CM | POA: Diagnosis not present

## 2023-12-05 DIAGNOSIS — M791 Myalgia, unspecified site: Secondary | ICD-10-CM | POA: Diagnosis not present

## 2023-12-05 DIAGNOSIS — M542 Cervicalgia: Secondary | ICD-10-CM | POA: Diagnosis not present

## 2024-01-01 ENCOUNTER — Encounter: Payer: Self-pay | Admitting: Adult Health

## 2024-01-01 ENCOUNTER — Ambulatory Visit: Payer: Medicare Other | Admitting: Adult Health

## 2024-01-01 DIAGNOSIS — G4700 Insomnia, unspecified: Secondary | ICD-10-CM

## 2024-01-01 DIAGNOSIS — F411 Generalized anxiety disorder: Secondary | ICD-10-CM

## 2024-01-01 DIAGNOSIS — F331 Major depressive disorder, recurrent, moderate: Secondary | ICD-10-CM | POA: Diagnosis not present

## 2024-01-01 MED ORDER — BUPROPION HCL ER (XL) 300 MG PO TB24: 300.0000 mg | ORAL_TABLET | Freq: Every day | ORAL | 1 refills | Status: DC

## 2024-01-01 MED ORDER — TRAZODONE HCL 50 MG PO TABS: 75.0000 mg | ORAL_TABLET | Freq: Every day | ORAL | 1 refills | Status: DC

## 2024-01-01 MED ORDER — DULOXETINE HCL 60 MG PO CPEP: 60.0000 mg | ORAL_CAPSULE | Freq: Two times a day (BID) | ORAL | 1 refills | Status: DC

## 2024-01-01 NOTE — Progress Notes (Signed)
 Kimberly Stone [id number] Jul 16, 1946 78 y.o.  Subjective:   Patient ID:  Kimberly Stone is a 78 y.o. (DOB [birth year]) female.  Chief Complaint: No chief complaint on file.   HPI Kimberly Stone presents to the office today for follow-up of MD, GAD and insomnia.  Describes mood today as ok. Pleasant. Denies tearfulness. Mood symptoms - reports decreased depression - better, but still there. Reports some anxiety and irritability. Denies panic attacks. Reports some worry, rumination and over thinking over grandchildren. Denies obsessive thoughts and acts. Reports improved interest and motivation - getting things done. Mood is consistent. Stating I feel like I'm doing alright, but I don't have the energy I would like to have. Feels like current medications are helpful. Taking medications as prescribed.  Energy levels stable - lower in the evenings. Active, does not have a regular exercise routine. Enjoys some usual interests and activities. Married. Lives with husband. Has 2 sons - 54 and 90 - lost her daughter at age 29. Has 5 grandchildren. Spending time with family.  Appetite adequate. Gastric bypass in 2003. Weight stable - 114 pounds. Reports sleeping better some nights than others. Averages 8 to 8.5 hours of sleep. Napping an hour a day. Focus and concentration difficulties. Completing tasks. Managing aspects of household. Retired - owns an assistive living facility center. Denies SI or HI.  Denies AH or VH. Denies self harm. Denies substance use.   GAD-7    Flowsheet Row Counselor from 06/12/2023 in [facility]  Total GAD-7 Score 10      PHQ2-9    Flowsheet Row Counselor from 06/12/2023 in [facility]  PHQ-2 Total Score 4  PHQ-9 Total Score 17      Flowsheet Row FERAHEME  from 04/11/2021 in [facility]  C-SSRS RISK CATEGORY No Risk        Review of Systems:  Review of  Systems  Musculoskeletal:  Negative for gait problem.  Neurological:  Negative for tremors.  Psychiatric/Behavioral:         Please refer to HPI    Medications: I have reviewed the patient's current medications.  Current Outpatient Medications  Medication Sig Dispense Refill   acetaminophen  (TYLENOL ) 650 MG CR tablet Take 650 mg by mouth as needed for pain.      amLODipine  (NORVASC ) 5 MG tablet Take 5 mg by mouth at bedtime.      buPROPion  (WELLBUTRIN  XL) 300 MG 24 hr tablet Take 1 tablet (300 mg total) by mouth daily. 90 tablet 1   clopidogrel  (PLAVIX ) 75 MG tablet TAKE 1 TABLET (75 MG TOTAL) BY MOUTH DAILY. PLEASE SCHEDULE APPOINTMENT FOR REFILLS. 7 tablet 0   CREON  36000-114000 units CPEP capsule Take by mouth. AS DIRECTED     DULoxetine  (CYMBALTA ) 30 MG capsule Take 1 capsule (30 mg total) by mouth daily. 90 capsule 1   DULoxetine  (CYMBALTA ) 60 MG capsule Take 1 capsule (60 mg total) by mouth daily. 90 capsule 1   enalapril  (VASOTEC ) 5 MG tablet Take 5 mg by mouth at bedtime.      hydrOXYzine  (ATARAX ) 25 MG tablet Take one to tablets at bedtime as needed for sleep. 60 tablet 2   metoCLOPramide  (REGLAN ) 5 MG tablet Take 5 mg by mouth every 6 (six) hours as needed.      Multiple Vitamin (MULTIVITAMIN WITH MINERALS) TABS Take 2 tablets by mouth daily.     pantoprazole  (PROTONIX ) 40 MG tablet Take 40 mg  by mouth daily with breakfast.     PRESCRIPTION MEDICATION Inject 1 application into the muscle. INJECTION every 21-30 days     traZODone  (DESYREL ) 50 MG tablet Take 1.5 tablets (75 mg total) by mouth at bedtime. 135 tablet 1   Vitamin D , Ergocalciferol , (DRISDOL ) 50000 UNITS CAPS Take 50,000 Units by mouth every Monday, Wednesday, and Friday.      No current facility-administered medications for this visit.    Medication Side Effects: None  Allergies:  Allergies  Allergen Reactions   Aspirin  Other (See Comments)    PT CANNOT TAKE DUE TO HX OF GASTRIC BYPASS SURGERY    Imodium  [Loperamide] Palpitations   Statins     Other reaction(s): Other Joint pain   Erythromycin Other (See Comments)    UNKNOWN    Past Medical History:  Diagnosis Date   Acute MI (HCC) 11/2004   NONSTEMI 2005 NOINTERVENTION PER NOTE    Anemia    from bypass- iron and B12   Anxiety    Arthritis    Coronary artery disease    DDD (degenerative disc disease), cervical    Depression    DJD (degenerative joint disease)    Fibromyalgia    GERD (gastroesophageal reflux disease)    GIB (gastrointestinal bleeding)    while on celecoxib post bypass   Hyperlipidemia    Hyperlipidemia LDL goal < 130 01/23/2014   Hypertension    Obesity    hx of bipass, now resolved.  borderline diabetes also resolved.   Osteopenia    Post-menopausal    SCCA (squamous cell carcinoma) of skin 09/21/2015   left forearm medial cx3,49fu   SCCA (squamous cell carcinoma) of skin 07/26/2016   bridge of nose cx3 24fu   SCCA (squamous cell carcinoma) of skin 02/10/2020   right inner cheek tx after biopsy   SCCA (squamous cell carcinoma) of skin 02/10/2020   left hand tx after biopsy   Scoliosis    Sinusitis    Statin intolerance 01/23/2014    Past Medical History, Surgical history, Social history, and Family history were reviewed and updated as appropriate.   Please see review of systems for further details on the patient's review from today.   Objective:   Physical Exam:  LMP 03/18/1972 (Approximate)   Physical Exam Constitutional:      General: She is not in acute distress. Musculoskeletal:        General: No deformity.  Neurological:     Mental Status: She is alert and oriented to person, place, and time.     Coordination: Coordination normal.  Psychiatric:        Attention and Perception: Attention and perception normal. She does not perceive auditory or visual hallucinations.        Mood and Affect: Mood normal. Mood is not anxious or depressed. Affect is not labile, blunt, angry or inappropriate.         Speech: Speech normal.        Behavior: Behavior normal.        Thought Content: Thought content normal. Thought content is not paranoid or delusional. Thought content does not include homicidal or suicidal ideation. Thought content does not include homicidal or suicidal plan.        Cognition and Memory: Cognition and memory normal.        Judgment: Judgment normal.     Comments: Insight intact     Lab Review:     Component Value Date/Time   NA 132 (L)  06/13/2015 0254   K 4.8 06/13/2015 0254   CL 100 (L) 06/13/2015 0254   CO2 23 06/13/2015 0254   GLUCOSE 143 (H) 06/13/2015 0254   BUN 14 06/13/2015 0254   CREATININE 0.85 06/13/2015 0254   CALCIUM  9.0 06/13/2015 0254   PROT 6.3 (L) 06/13/2015 0254   ALBUMIN 3.1 (L) 06/13/2015 0254   AST 27 06/13/2015 0254   ALT 46 06/13/2015 0254   ALKPHOS 183 (H) 06/13/2015 0254   BILITOT 1.1 06/13/2015 0254   GFRNONAA >60 06/13/2015 0254   GFRAA >60 06/13/2015 0254       Component Value Date/Time   WBC 10.9 (H) 06/12/2015 0402   RBC 4.18 06/12/2015 0402   HGB 12.5 06/12/2015 0402   HCT 36.7 06/12/2015 0402   PLT 279 06/12/2015 0402   MCV 87.8 06/12/2015 0402   MCH 29.9 06/12/2015 0402   MCHC 34.1 06/12/2015 0402   RDW 13.2 06/12/2015 0402   LYMPHSABS 1.4 06/11/2015 0451   MONOABS 0.8 06/11/2015 0451   EOSABS 0.0 06/11/2015 0451   BASOSABS 0.0 06/11/2015 0451    No results found for: POCLITH, LITHIUM    No results found for: PHENYTOIN, PHENOBARB, VALPROATE, CBMZ   .res Assessment: Plan:    Plan:  PDMP reviewed  Wellbutrin  XL 300mg  denies seizure history Increase Cymbalta  60mg  and 30mg  daily to 60mg  twice daily Trazdone 50mg  at hs  - working on increasing dose - may increase up to 100mg  at hs  RTC 3 months  Patient advised to contact office with any questions, adverse effects, or acute worsening in signs and symptoms.  There are no diagnoses linked to this encounter.   Please see After Visit Summary  for patient specific instructions.  Future Appointments  Date Time Provider Department Center  01/01/2024 10:00 AM Saidah Kempton Nattalie, NP [facility] None    No orders of the defined types were placed in this encounter.   -------------------------------

## 2024-01-02 ENCOUNTER — Telehealth: Payer: Self-pay | Admitting: Adult Health

## 2024-01-02 ENCOUNTER — Other Ambulatory Visit: Payer: Self-pay

## 2024-01-02 DIAGNOSIS — F331 Major depressive disorder, recurrent, moderate: Secondary | ICD-10-CM

## 2024-01-02 DIAGNOSIS — F411 Generalized anxiety disorder: Secondary | ICD-10-CM

## 2024-01-02 NOTE — Telephone Encounter (Signed)
 Pt Lvm @ 10:47a stating that she had an appt yesterday with Bonnell Butcher.  Bonnell Butcher stared her on a new medicine, but the pharmacy called and said there was a problem with the script (she didn't say what the problem was).  [id number] - [place], [place] - [address] [place], [place] [address] Phone: [phone]  Fax: [phone]   Next appt 4/15

## 2024-01-02 NOTE — Telephone Encounter (Signed)
 Spoke with Adallyn she states the mediation in question is Cymbalta  60 mg. It was increased yesterday to 60 mg bid.  I called the pharmacy, they're closed for lunch. I will call back at 2 pm

## 2024-01-02 NOTE — Telephone Encounter (Signed)
 Pharmacy said that its too soon, she picked up an rx Cymbalta  60mg   qty 63 on 1/10.Aaron AasAaron AasThe pharmacy dc the old rx . I will call pt and let her know to double up on the rx she pu on 1/10 and when she begins to run low to call the pharmacy and they'll fill for the new rx.   Pt understands.

## 2024-01-11 DIAGNOSIS — M130 Polyarthritis, unspecified: Secondary | ICD-10-CM | POA: Diagnosis not present

## 2024-01-11 DIAGNOSIS — F339 Major depressive disorder, recurrent, unspecified: Secondary | ICD-10-CM | POA: Diagnosis not present

## 2024-01-11 DIAGNOSIS — M8580 Other specified disorders of bone density and structure, unspecified site: Secondary | ICD-10-CM | POA: Diagnosis not present

## 2024-01-11 DIAGNOSIS — K8020 Calculus of gallbladder without cholecystitis without obstruction: Secondary | ICD-10-CM | POA: Diagnosis not present

## 2024-01-11 DIAGNOSIS — K8689 Other specified diseases of pancreas: Secondary | ICD-10-CM | POA: Diagnosis not present

## 2024-01-11 DIAGNOSIS — E785 Hyperlipidemia, unspecified: Secondary | ICD-10-CM | POA: Diagnosis not present

## 2024-01-11 DIAGNOSIS — I2510 Atherosclerotic heart disease of native coronary artery without angina pectoris: Secondary | ICD-10-CM | POA: Diagnosis not present

## 2024-01-11 DIAGNOSIS — E611 Iron deficiency: Secondary | ICD-10-CM | POA: Diagnosis not present

## 2024-01-11 DIAGNOSIS — F419 Anxiety disorder, unspecified: Secondary | ICD-10-CM | POA: Diagnosis not present

## 2024-01-11 DIAGNOSIS — I119 Hypertensive heart disease without heart failure: Secondary | ICD-10-CM | POA: Diagnosis not present

## 2024-01-11 DIAGNOSIS — M791 Myalgia, unspecified site: Secondary | ICD-10-CM | POA: Diagnosis not present

## 2024-01-11 DIAGNOSIS — K219 Gastro-esophageal reflux disease without esophagitis: Secondary | ICD-10-CM | POA: Diagnosis not present

## 2024-01-15 ENCOUNTER — Telehealth: Payer: Self-pay | Admitting: Cardiovascular Disease

## 2024-01-15 NOTE — Telephone Encounter (Signed)
Patient states Dr. Waynard Edwards suggested contacting Dr. Royann Shivers to have an echo ordered.

## 2024-01-15 NOTE — Telephone Encounter (Signed)
Spoke with pt, when she was seen for her yearly appointment he asked about SOB and she told him only when she goes up hill or if walking fast. She reports she has had this for 6-7 months and it does not bother her. She denies any swelling or other issues. She has a follow up appointment in march and will wait until that appointment to discuss getting echo then. Patient voiced understanding to call if symptoms change or worsen.

## 2024-02-04 DIAGNOSIS — R42 Dizziness and giddiness: Secondary | ICD-10-CM | POA: Diagnosis not present

## 2024-02-04 DIAGNOSIS — Z1283 Encounter for screening for malignant neoplasm of skin: Secondary | ICD-10-CM | POA: Diagnosis not present

## 2024-02-04 DIAGNOSIS — H8113 Benign paroxysmal vertigo, bilateral: Secondary | ICD-10-CM | POA: Diagnosis not present

## 2024-02-04 DIAGNOSIS — K219 Gastro-esophageal reflux disease without esophagitis: Secondary | ICD-10-CM | POA: Diagnosis not present

## 2024-02-20 ENCOUNTER — Other Ambulatory Visit: Payer: Self-pay | Admitting: Adult Health

## 2024-02-20 DIAGNOSIS — G4700 Insomnia, unspecified: Secondary | ICD-10-CM

## 2024-03-03 ENCOUNTER — Ambulatory Visit: Admitting: Adult Health

## 2024-03-03 ENCOUNTER — Encounter: Payer: Self-pay | Admitting: Adult Health

## 2024-03-03 DIAGNOSIS — F331 Major depressive disorder, recurrent, moderate: Secondary | ICD-10-CM | POA: Diagnosis not present

## 2024-03-03 DIAGNOSIS — F411 Generalized anxiety disorder: Secondary | ICD-10-CM | POA: Diagnosis not present

## 2024-03-03 DIAGNOSIS — G4700 Insomnia, unspecified: Secondary | ICD-10-CM | POA: Diagnosis not present

## 2024-03-03 NOTE — Progress Notes (Signed)
 Kimberly Stone [id number] [birth year] 78 y.o.  Subjective:   Patient ID:  Kimberly Stone is a 78 y.o. (DOB 10/03/1946) female.  Chief Complaint: No chief complaint on file.   HPI Kimberly Stone presents to the office today for follow-up of MDD, GAD and insomnia.  Describes mood today as "ok". Pleasant. Denies tearfulness. Mood symptoms - denies depression and anxiety. Reports improved interest and motivation. Reports some irritability. Denies panic attacks. Reports some worry, rumination and over thinking over. Denies obsessive thoughts and acts. Mood is consistent. Stating "I feel like I'm doing really good". Feels like current medications are helpful. Taking medications as prescribed.  Energy levels improved. Active, does not have a regular exercise routine. Enjoys some usual interests and activities. Married. Lives with husband. Has 2 sons - 47 and 94 - lost her daughter at age 4. Has 5 grandchildren. Spending time with family.  Appetite adequate. Gastric bypass in 2003. Weight loss - 110 from 114 pounds. Reports sleeping better some nights than others. Averages 8 hours of sleep.  Focus and concentration difficulties. Completing tasks. Managing aspects of household. 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 Denies SI or HI.  Denies AH or VH. Denies self harm. Denies substance use.   GAD-7    Flowsheet Row Counselor from 06/12/2023 in [facility]  Total GAD-7 Score 10      PHQ2-9    Flowsheet Row Counselor from 06/12/2023 in [facility]  PHQ-2 Total Score 4  PHQ-9 Total Score 17      Flowsheet Row FERAHEME from 04/11/2021 in [facility]  C-SSRS RISK CATEGORY No Risk        Review of Systems:  Review of Systems  Musculoskeletal:  Negative for gait problem.  Neurological:  Negative for tremors.  Psychiatric/Behavioral:         Please refer to HPI     Medications: I have reviewed the patient's current medications.  Current Outpatient Medications  Medication Sig Dispense Refill   acetaminophen (TYLENOL) 650 MG CR tablet Take 650 mg by mouth as needed for pain.      amLODipine (NORVASC) 5 MG tablet Take 5 mg by mouth at bedtime.      buPROPion (WELLBUTRIN XL) 300 MG 24 hr tablet Take 1 tablet (300 mg total) by mouth daily. 90 tablet 1   clopidogrel (PLAVIX) 75 MG tablet TAKE 1 TABLET (75 MG TOTAL) BY MOUTH DAILY. PLEASE SCHEDULE APPOINTMENT FOR REFILLS. 7 tablet 0   CREON 36000-114000 units CPEP capsule Take by mouth. AS DIRECTED     DULoxetine (CYMBALTA) 60 MG capsule Take 1 capsule (60 mg total) by mouth 2 (two) times daily. 180 capsule 1   enalapril (VASOTEC) 5 MG tablet Take 5 mg by mouth at bedtime.      hydrOXYzine (ATARAX) 25 MG tablet Take one to tablets at bedtime as needed for sleep. 60 tablet 2   metoCLOPramide (REGLAN) 5 MG tablet Take 5 mg by mouth every 6 (six) hours as needed.      Multiple Vitamin (MULTIVITAMIN WITH MINERALS) TABS Take 2 tablets by mouth daily.     pantoprazole (PROTONIX) 40 MG tablet Take 40 mg by mouth daily with breakfast.     PRESCRIPTION MEDICATION Inject 1 application into the muscle. INJECTION every 21-30 days     traZODone (DESYREL) 50 MG tablet Take 1.5 tablets (75 mg total) by mouth at bedtime. 135 tablet  1   Vitamin D, Ergocalciferol, (DRISDOL) 50000 UNITS CAPS Take 50,000 Units by mouth every Monday, Wednesday, and Friday.      No current facility-administered medications for this visit.    Medication Side Effects: None  Allergies:  Allergies  Allergen Reactions   Aspirin Other (See Comments)    PT CANNOT TAKE DUE TO HX OF GASTRIC BYPASS SURGERY    Loperamide Palpitations and Other (See Comments)   Statins     Other reaction(s): Other Joint pain   Erythromycin Other (See Comments)    UNKNOWN    Past Medical History:  Diagnosis Date   Acute MI (HCC) 11/2004   NONSTEMI 2005  NOINTERVENTION PER NOTE    Anemia    from bypass- iron and B12   Anxiety    Arthritis    Coronary artery disease    DDD (degenerative disc disease), cervical    Depression    DJD (degenerative joint disease)    Fibromyalgia    GERD (gastroesophageal reflux disease)    GIB (gastrointestinal bleeding)    while on celecoxib post bypass   Hyperlipidemia    Hyperlipidemia LDL goal < 130 01/23/2014   Hypertension    Obesity    hx of bipass, now resolved.  borderline diabetes also resolved.   Osteopenia    Post-menopausal    SCCA (squamous cell carcinoma) of skin 09/21/2015   left forearm medial cx3,41fu   SCCA (squamous cell carcinoma) of skin 07/26/2016   bridge of nose cx3 32fu   SCCA (squamous cell carcinoma) of skin 02/10/2020   right inner cheek tx after biopsy   SCCA (squamous cell carcinoma) of skin 02/10/2020   left hand tx after biopsy   Scoliosis    Sinusitis    Statin intolerance 01/23/2014    Past Medical History, Surgical history, Social history, and Family history were reviewed and updated as appropriate.   Please see review of systems for further details on the patient's review from today.   Objective:   Physical Exam:  LMP 03/18/1972 (Approximate)   Physical Exam Constitutional:      General: She is not in acute distress. Musculoskeletal:        General: No deformity.  Neurological:     Mental Status: She is alert and oriented to person, place, and time.     Coordination: Coordination normal.  Psychiatric:        Attention and Perception: Attention and perception normal. She does not perceive auditory or visual hallucinations.        Mood and Affect: Affect is not labile, blunt, angry or inappropriate.        Speech: Speech normal.        Behavior: Behavior normal.        Thought Content: Thought content normal. Thought content is not paranoid or delusional. Thought content does not include homicidal or suicidal ideation. Thought content does not include  homicidal or suicidal plan.        Cognition and Memory: Cognition and memory normal.        Judgment: Judgment normal.     Comments: Insight intact     Lab Review:     Component Value Date/Time   NA 132 (L) 06/13/2015 0254   K 4.8 06/13/2015 0254   CL 100 (L) 06/13/2015 0254   CO2 23 06/13/2015 0254   GLUCOSE 143 (H) 06/13/2015 0254   BUN 14 06/13/2015 0254   CREATININE 0.85 06/13/2015 0254   CALCIUM 9.0 06/13/2015 0254  PROT 6.3 (L) 06/13/2015 0254   ALBUMIN 3.1 (L) 06/13/2015 0254   AST 27 06/13/2015 0254   ALT 46 06/13/2015 0254   ALKPHOS 183 (H) 06/13/2015 0254   BILITOT 1.1 06/13/2015 0254   GFRNONAA >60 06/13/2015 0254   GFRAA >60 06/13/2015 0254       Component Value Date/Time   WBC 10.9 (H) 06/12/2015 0402   RBC 4.18 06/12/2015 0402   HGB 12.5 06/12/2015 0402   HCT 36.7 06/12/2015 0402   PLT 279 06/12/2015 0402   MCV 87.8 06/12/2015 0402   MCH 29.9 06/12/2015 0402   MCHC 34.1 06/12/2015 0402   RDW 13.2 06/12/2015 0402   LYMPHSABS 1.4 06/11/2015 0451   MONOABS 0.8 06/11/2015 0451   EOSABS 0.0 06/11/2015 0451   BASOSABS 0.0 06/11/2015 0451    No results found for: "POCLITH", "LITHIUM"   No results found for: "PHENYTOIN", "PHENOBARB", "VALPROATE", "CBMZ"   .res Assessment: Plan:    Plan:  PDMP reviewed  Wellbutrin XL 300mg  denies seizure history Cymbalta 60mg  twice daily Trazdone 50mg  at hs  - working on increasing dose - may increase up to 100mg  at hs.  Will complete adult ADD screening tool. Consider adding a low dose of Adderall for focus and concentration.  RTC 3 months  Patient advised to contact office with any questions, adverse effects, or acute worsening in signs and symptoms.   There are no diagnoses linked to this encounter.   Please see After Visit Summary for patient specific instructions.  Future Appointments  Date Time Provider Department Center  03/03/2024 10:00 AM Ajay Strubel, Thereasa Solo, NP [facility] None  03/14/2024 10:00  AM Croitoru, Rachelle Hora, MD [facility] None    No orders of the defined types were placed in this encounter.   -------------------------------

## 2024-03-14 ENCOUNTER — Ambulatory Visit: Payer: Medicare Other | Attending: Cardiovascular Disease | Admitting: Cardiovascular Disease

## 2024-03-14 ENCOUNTER — Encounter: Payer: Self-pay | Admitting: Cardiovascular Disease

## 2024-03-14 VITALS — BP 100/58 | HR 63 | Ht 62.0 in | Wt 114.2 lb

## 2024-03-14 DIAGNOSIS — E7800 Pure hypercholesterolemia, unspecified: Secondary | ICD-10-CM

## 2024-03-14 DIAGNOSIS — G720 Drug-induced myopathy: Secondary | ICD-10-CM | POA: Diagnosis not present

## 2024-03-14 DIAGNOSIS — T466X5D Adverse effect of antihyperlipidemic and antiarteriosclerotic drugs, subsequent encounter: Secondary | ICD-10-CM | POA: Diagnosis not present

## 2024-03-14 DIAGNOSIS — I350 Nonrheumatic aortic (valve) stenosis: Secondary | ICD-10-CM | POA: Diagnosis present

## 2024-03-14 DIAGNOSIS — I10 Essential (primary) hypertension: Secondary | ICD-10-CM

## 2024-03-14 DIAGNOSIS — I25118 Atherosclerotic heart disease of native coronary artery with other forms of angina pectoris: Secondary | ICD-10-CM | POA: Diagnosis not present

## 2024-03-14 DIAGNOSIS — I447 Left bundle-branch block, unspecified: Secondary | ICD-10-CM | POA: Diagnosis not present

## 2024-03-14 NOTE — Patient Instructions (Signed)
 Medication Instructions:  No changes *If you need a refill on your cardiac medications before your next appointment, please call your pharmacy*  Testing/Procedures: Your physician has requested that you have an echocardiogram. Echocardiography is a painless test that uses sound waves to create images of your heart. It provides your doctor with information about the size and shape of your heart and how well your heart's chambers and valves are working. This procedure takes approximately one hour. There are no restrictions for this procedure. Please do NOT wear cologne, perfume, aftershave, or lotions (deodorant is allowed). Please arrive 15 minutes prior to your appointment time.  Please note: We ask at that you not bring children with you during ultrasound (echo/ vascular) testing. Due to room size and safety concerns, children are not allowed in the ultrasound rooms during exams. Our front office staff cannot provide observation of children in our lobby area while testing is being conducted. An adult accompanying a patient to their appointment will only be allowed in the ultrasound room at the discretion of the ultrasound technician under special circumstances. We apologize for any inconvenience.   Follow-Up: At [facility], you and your health needs are our priority.  As part of our continuing mission to provide you with exceptional heart care, our providers are all part of one team.  This team includes your primary Cardiologist (physician) and Advanced Practice Providers or APPs (Physician Assistants and Nurse Practitioners) who all work together to provide you with the care you need, when you need it.  Your next appointment:   1 year(s)  Provider:   Thurmon Fair, MD     We recommend signing up for the patient portal called "MyChart".  Sign up information is provided on this After Visit Summary.  MyChart is used to connect with patients for Virtual Visits (Telemedicine).  Patients  are able to view lab/test results, encounter notes, upcoming appointments, etc.  Non-urgent messages can be sent to your provider as well.   To learn more about what you can do with MyChart, go to [email or web address].         1st Floor: - Lobby - Registration  - Pharmacy  - Lab - Cafe  2nd Floor: - PV Lab - Diagnostic Testing (echo, CT, nuclear med)  3rd Floor: - Vacant  4th Floor: - TCTS (cardiothoracic surgery) - AFib Clinic - Structural Heart Clinic - Vascular Surgery  - Vascular Ultrasound  5th Floor: - HeartCare Cardiology (general and EP) - Clinical Pharmacy for coumadin, hypertension, lipid, weight-loss medications, and med management appointments    Valet parking services will be available as well.

## 2024-03-14 NOTE — Progress Notes (Signed)
 Patient ID: Kimberly Stone, female   DOB: 12-19-1945, 78 y.o.   MRN: [id number]       03/14/2024  Reason for office visit AS, CAD, hyperlipidemia, statin intolerance   Kimberly Stone is a 78 year old woman with a history of coronary disease (presented with non-STEMI in 2005 related to severe diffuse disease in ramus intermedius, not amenable to PCI), mild calcific aortic stenosis, LBBB, hypertension, hyperlipidemia, GERD, resolution of obesity and prediabetes after gastric bypass surgery.  Despite previous cholecystectomy she had common bile duct obstruction with pancreatitis in 2022.  She has recovered well from this.  Since her last appointment she has developed gradually worsening dyspnea on exertion, NYHA functional class II.  She has not had problems with edema, orthopnea, PND exertional chest pain or syncope.  She denies palpitations.  Metabolic control is fair with an LDL cholesterol of 79, HDL 63 and normal glucose levels.  She has a history of intolerance to multiple lipid-lowering agents due to either myopathy or liver test abnormalities (simvastatin, atorvastatin, rosuvastatin, pitavastatin, ezetimibe) and is managed on PCSK9 inhibitors.  In the past her LDL cholesterol was even better around 50.   She has bilateral carotid bruits that appear to be radiating from the aortic stenosis murmur in her chest.  She had duplex ultrasound of the carotid arteries in October 2022 that did not show any significant stenoses and relatively minor plaque.   Allergies  Allergen Reactions   Aspirin Other (See Comments)    PT CANNOT TAKE DUE TO HX OF GASTRIC BYPASS SURGERY    Loperamide Palpitations and Other (See Comments)   Statins     Other reaction(s): Other Joint pain   Erythromycin Other (See Comments)    UNKNOWN    Current Outpatient Medications  Medication Sig Dispense Refill   acetaminophen (TYLENOL) 650 MG CR tablet Take 650 mg by mouth as needed for pain.      amLODipine (NORVASC)  5 MG tablet Take 5 mg by mouth at bedtime.      buPROPion (WELLBUTRIN XL) 300 MG 24 hr tablet Take 1 tablet (300 mg total) by mouth daily. 90 tablet 1   clopidogrel (PLAVIX) 75 MG tablet TAKE 1 TABLET (75 MG TOTAL) BY MOUTH DAILY. PLEASE SCHEDULE APPOINTMENT FOR REFILLS. 7 tablet 0   DULoxetine (CYMBALTA) 60 MG capsule Take 1 capsule (60 mg total) by mouth 2 (two) times daily. 180 capsule 1   enalapril (VASOTEC) 5 MG tablet Take 5 mg by mouth at bedtime.      LUMIGAN 0.01 % SOLN Place 1 drop into both eyes at bedtime.     meclizine (ANTIVERT) 25 MG tablet Take 25 mg by mouth 3 (three) times daily as needed.     methocarbamol (ROBAXIN) 500 MG tablet Take 500 mg by mouth every 6 (six) hours.     metoCLOPramide (REGLAN) 5 MG tablet Take 5 mg by mouth every 6 (six) hours as needed.      Multiple Vitamin (MULTIVITAMIN WITH MINERALS) TABS Take 2 tablets by mouth daily.     pantoprazole (PROTONIX) 40 MG tablet Take 40 mg by mouth daily with breakfast.     PRESCRIPTION MEDICATION Inject 1 application into the muscle. INJECTION every 21-30 days     timolol (TIMOPTIC) 0.5 % ophthalmic solution Place 1 drop into both eyes 2 (two) times daily.     traZODone (DESYREL) 50 MG tablet Take 1.5 tablets (75 mg total) by mouth at bedtime. 135 tablet 1   Turmeric (QC TUMERIC COMPLEX  PO) Take 1 capsule by mouth daily at 6 (six) AM.     ursodiol (ACTIGALL) 300 MG capsule Take 300 mg by mouth daily.     Vitamin D, Ergocalciferol, (DRISDOL) 50000 UNITS CAPS Take 50,000 Units by mouth every Monday, Wednesday, and Friday.      CREON 36000-114000 units CPEP capsule Take by mouth. AS DIRECTED (Patient not taking: Reported on 03/14/2024)     hydrOXYzine (ATARAX) 25 MG tablet Take one to tablets at bedtime as needed for sleep. (Patient not taking: Reported on 03/14/2024) 60 tablet 2   No current facility-administered medications for this visit.    Past Medical History:  Diagnosis Date   Acute MI (HCC) 11/2004   NONSTEMI  2005 NOINTERVENTION PER NOTE    Anemia    from bypass- iron and B12   Anxiety    Arthritis    Coronary artery disease    DDD (degenerative disc disease), cervical    Depression    DJD (degenerative joint disease)    Fibromyalgia    GERD (gastroesophageal reflux disease)    GIB (gastrointestinal bleeding)    while on celecoxib post bypass   Hyperlipidemia    Hyperlipidemia LDL goal < 130 01/23/2014   Hypertension    Obesity    hx of bipass, now resolved.  borderline diabetes also resolved.   Osteopenia    Post-menopausal    SCCA (squamous cell carcinoma) of skin 09/21/2015   left forearm medial cx3,33fu   SCCA (squamous cell carcinoma) of skin 07/26/2016   bridge of nose cx3 65fu   SCCA (squamous cell carcinoma) of skin 02/10/2020   right inner cheek tx after biopsy   SCCA (squamous cell carcinoma) of skin 02/10/2020   left hand tx after biopsy   Scoliosis    Sinusitis    Statin intolerance 01/23/2014    Past Surgical History:  Procedure Laterality Date   CARDIAC CATHETERIZATION     2005 Severely disease small optional diagonal vessel responsible far wall motion abnormalities wise to small and diffusely diseased for consideration of interventio.   CATARACT EXTRACTION     COLONOSCOPY     GASTRIC BYPASS  01/2004   KNEE ARTHROSCOPY Right    LAPAROSCOPIC CHOLECYSTECTOMY  1993   TOTAL VAGINAL HYSTERECTOMY  1973   secondary to DUB - Dr. Roberto Scales and Dr. Katrinka Blazing    Family History  Problem Relation Age of Onset   Heart failure Mother    Liver cancer Father    Cancer Brother    Heart failure Maternal Grandmother    Aneurysm Maternal Grandmother        ruptured abdominal aortic aneurysm   Lung cancer Brother 73    Review of systems: Please see history of present illness.  Denies other complaints.   PHYSICAL EXAM BP (!) 100/58 (BP Location: Left Arm, Patient Position: Sitting)   Pulse 63   Ht 5\' 2"  (1.575 m)   Wt 114 lb 3.2 oz (51.8 kg)   LMP 03/18/1972 (Approximate)   SpO2  95%   BMI 20.89 kg/m     General: Alert, oriented x3, no distress, lean and fit Head: no evidence of trauma, PERRL, EOMI, no exophtalmos or lid lag, no myxedema, no xanthelasma; normal ears, nose and oropharynx Neck: normal jugular venous pulsations; bilateral carotid bruits without carotid delay Chest: clear to auscultation, no signs of consolidation by percussion or palpation, normal fremitus, symmetrical and full respiratory excursions Cardiovascular: normal position and quality of the apical impulse, regular rhythm, normal first  and paradoxically split second heart sounds, 3/6 early peaking systolic ejection murmur, no diastolic murmurs, rubs or gallops Abdomen: no tenderness or distention, no masses by palpation, no abnormal pulsatility or arterial bruits, normal bowel sounds, no hepatosplenomegaly Extremities: no clubbing, cyanosis or edema; 2+ radial, ulnar and brachial pulses bilaterally; 2+ right femoral, posterior tibial and dorsalis pedis pulses; 2+ left femoral, posterior tibial and dorsalis pedis pulses; no subclavian or femoral bruits Neurological: grossly nonfocal Psych: Normal mood and affect    Echocardiogram 08/31/2021:   1. Left ventricular ejection fraction, by estimation, is 60 to 65%. The  left ventricle has normal function. The left ventricle has no regional  wall motion abnormalities. There is mild asymmetric left ventricular  hypertrophy. Left ventricular diastolic  parameters were normal.   2. Right ventricular systolic function is moderately reduced. The right  ventricular size is normal.   3. The mitral valve is normal in structure. Mild mitral valve  regurgitation.   4. The aortic valve is grossly normal. Aortic valve regurgitation is  mild. Mild aortic valve stenosis.   Comparison(s): 01/30/19 EF 55-60%. Mild AS mean PG, peak PG.   RIGHT VENTRICLE  RV Basal diam:  2.60 cm  RV S prime:     7.48 cm/s  TAPSE (M-mode): 1.3 cm   AORTIC VALVE   AV Area (Vmax):    1.66 cm  AV Area (Vmean):   1.57 cm  AV Area (VTI):     1.62 cm  AV Vmax:           223.50 cm/s  AV Vmean:          157.000 cm/s  AV VTI:            0.460 m  AV Peak Grad:      20.0 mmHg  AV Mean Grad:      11.0 mmHg  LVOT Vmax:         107.00 cm/s  LVOT Vmean:        71.300 cm/s  LVOT VTI:          0.215 m  LVOT/AV VTI ratio: 0.47  AI PHT:            513 msec   EKG:    EKG Interpretation Date/Time:  Friday March 14 2024 10:31:23 EDT Ventricular Rate:  63 PR Interval:  168 QRS Duration:  138 QT Interval:  430 QTC Calculation: 440 R Axis:   -54  Text Interpretation: Normal sinus rhythm Possible Left atrial enlargement Left axis deviation Left bundle branch block When compared with ECG of 08-Jun-2015 14:19,  LBBB is new Confirmed by Zafira Munos [id number]) on 03/14/2024 10:42:00 AM      But LBBB was seen on 2024 tracing   Lipid Panel on therapy, currently stopped    Component Value Date/Time   CHOL 165 01/04/2014 0235   TRIG 86 01/04/2014 0235   HDL 79 01/04/2014 0235   CHOLHDL 2.1 01/04/2014 0235   VLDL 17 01/04/2014 0235   LDLCALC 69 01/04/2014 0235  02/25/2020 [place] 256, HDL 62, LDL 149, triglycerides 225 Hemoglobin 12.7, creatinine 0.8, TSH 1.63  03/03/2021 Cholesterol 153, HDL 63, LDL 61, triglycerides 144 Hemoglobin 13.6, potassium 4.4  11/01/2022 Cholesterol 155, HDL 65, LDL 50, triglycerides 200  05/01/2023 Cholesterol 170, HDL 63, LDL 79, triglycerides 142   BMET    Component Value Date/Time   NA 132 (L) 06/13/2015 0254   K 4.8 06/13/2015 0254   CL 100 (L) 06/13/2015 0254  CO2 23 06/13/2015 0254   GLUCOSE 143 (H) 06/13/2015 0254   BUN 14 06/13/2015 0254   CREATININE 0.85 06/13/2015 0254   CALCIUM 9.0 06/13/2015 0254   GFRNONAA >60 06/13/2015 0254   GFRAA >60 06/13/2015 0254     ASSESSMENT AND PLAN:  1. Essential hypertension   2. LBBB (left bundle branch block)       1. CAD: Asymptomatic with amlodipine as  the only antianginal agent.  On chronic clopidogrel, cannot take aspirin due to her gastric bypass surgery related complaints. 2. AS: Mild by most recent echocardiogram performed in September 2022, showing virtually no change when compared with the previous study from February 2020.  She has now developed exertional dyspnea.  Will schedule for repeat echocardiogram. 3. HLP: Not sure why, but LDL cholesterol is a little higher than target even though she is still taking PCSK9 inhibitors.  Due for repeat lipid profile later this spring.   4. HTN: Well-controlled on enalapril and amlodipine.  In fact her blood pressure is fairly low today.  Does not have symptoms of hypotension. 5. Bilateral carotid bruits: Radiating from the aortic stenosis murmur, no evidence of carotid stenosis by duplex ultrasound in 2022. 6. LBBB: No symptoms of high-grade AV block.  Monitor.  There are no Patient Instructions on file for this visit.  Orders Placed This Encounter  Procedures   EKG 12-Lead    No orders of the defined types were placed in this encounter.  Check echocardiogram.  If there has been no progression of her aortic stenosis or change in left ventricular function, follow-up in 1 year.  Need to clarify if she is on appropriate lipid-lowering medications.  Jeovanny Cuadros  Thurmon Fair, MD, [facility] [phone] office [phone] pager

## 2024-03-25 ENCOUNTER — Telehealth: Payer: Self-pay | Admitting: Emergency Medicine

## 2024-03-25 NOTE — Telephone Encounter (Signed)
 Croitoru, Rachelle Hora, MD  Scheryl Marten, RN Just noticed that her medication list no longer contains either Praluent or Repatha or any other cholesterol-lowering medication.  Can you please clarify if she is still on any of those?  She is currently taking Repatha. Updated her med list.

## 2024-04-01 ENCOUNTER — Ambulatory Visit: Payer: Medicare Other | Admitting: Adult Health

## 2024-04-17 ENCOUNTER — Ambulatory Visit (HOSPITAL_COMMUNITY): Attending: Cardiovascular Disease

## 2024-04-17 DIAGNOSIS — I350 Nonrheumatic aortic (valve) stenosis: Secondary | ICD-10-CM | POA: Diagnosis not present

## 2024-04-17 LAB — ECHOCARDIOGRAM COMPLETE
AR max vel: 0.97 cm2
AV Area VTI: 0.81 cm2
AV Area mean vel: 0.96 cm2
AV Mean grad: 18.4 mmHg
AV Peak grad: 31.6 mmHg
Ao pk vel: 2.81 m/s
Area-P 1/2: 3.21 cm2
P 1/2 time: 495 ms
S' Lateral: 3.4 cm

## 2024-04-18 ENCOUNTER — Encounter: Payer: Self-pay | Admitting: Cardiovascular Disease

## 2024-04-18 DIAGNOSIS — I350 Nonrheumatic aortic (valve) stenosis: Secondary | ICD-10-CM

## 2024-04-18 MED ORDER — FUROSEMIDE 20 MG PO TABS: 20.0000 mg | ORAL_TABLET | Freq: Every day | ORAL | 3 refills | Status: AC

## 2024-04-18 NOTE — Telephone Encounter (Signed)
 Left the information below over voicemail. (It was also send in MyChart) left call back number.   Echo shows progression of the aortic stenosis, although this remains moderate.  There is also evidence of diastolic dysfunction with increased filling pressures. Start furosemide 20 mg daily and eat a diet rich in potassium. Repeat echo for arctic stenosis in 6 months.  Please make an appointment immediately following that echocardiogram.  Tell her to call us  for sooner appointment if the diuretic does not improve her shortness of breath   Placed orders for Echo- in 6 months RX for Furosemide 20 mg daily sent to pt's pharmacy  Recall placed for 6 month appt with Dr Alvis Ba

## 2024-04-22 ENCOUNTER — Telehealth: Payer: Self-pay | Admitting: Cardiovascular Disease

## 2024-04-22 NOTE — Telephone Encounter (Signed)
 Pt c/o medication issue:  1. Name of Medication: furosemide (LASIX) 20 MG tablet   2. How are you currently taking this medication (dosage and times per day)?    3. Are you having a reaction (difficulty breathing--STAT)? no  4. What is your medication issue? Patient calling to see why she was put on that medication. Please advise

## 2024-04-22 NOTE — Telephone Encounter (Signed)
 Kimberly Rumple, MD 04/18/2024 11:53 AM EDT     Echo shows progression of the aortic stenosis, although this remains moderate.  There is also evidence of diastolic dysfunction with increased filling pressures. Start furosemide 20 mg daily and eat a diet rich in potassium. Repeat echo for arctic stenosis in 6 months.  Please make an appointment immediately following that echocardiogram.  Tell her to call us  for sooner appointment if the diuretic does not improve her shortness of breath   Returned call to patient and explained the above in detail. All questions answered. She understands she will be called for appt.

## 2024-04-30 ENCOUNTER — Encounter: Payer: Self-pay | Admitting: Adult Health

## 2024-04-30 ENCOUNTER — Ambulatory Visit: Admitting: Adult Health

## 2024-04-30 DIAGNOSIS — F411 Generalized anxiety disorder: Secondary | ICD-10-CM

## 2024-04-30 DIAGNOSIS — F900 Attention-deficit hyperactivity disorder, predominantly inattentive type: Secondary | ICD-10-CM

## 2024-04-30 DIAGNOSIS — F331 Major depressive disorder, recurrent, moderate: Secondary | ICD-10-CM | POA: Diagnosis not present

## 2024-04-30 DIAGNOSIS — G4700 Insomnia, unspecified: Secondary | ICD-10-CM

## 2024-04-30 MED ORDER — DULOXETINE HCL 60 MG PO CPEP: 60.0000 mg | ORAL_CAPSULE | Freq: Two times a day (BID) | ORAL | 1 refills | Status: AC

## 2024-04-30 MED ORDER — BUPROPION HCL ER (XL) 300 MG PO TB24: 300.0000 mg | ORAL_TABLET | Freq: Every day | ORAL | 1 refills | Status: DC

## 2024-04-30 MED ORDER — TRAZODONE HCL 50 MG PO TABS: 75.0000 mg | ORAL_TABLET | Freq: Every day | ORAL | 1 refills | Status: DC

## 2024-04-30 NOTE — Progress Notes (Signed)
 Kimberly Stone [id number] 04/15/1946 78 y.o.  Subjective:   Patient ID:  Kimberly Stone is a 78 y.o. (DOB [birth year]) female.  Chief Complaint: No chief complaint on file.   HPI MATTYE RECZEK presents to the office today for follow-up of MDD, GAD and insomnia.  Describes mood today as "ok". Pleasant. Denies tearfulness. Mood symptoms - denies depression and anxiety. Reports stable interest and motivation. Reports some irritability. Denies panic attacks. Reports some worry - "off and on about different things". Denies rumination and over thinking. Denies obsessive thoughts and acts. Mood is consistent. Stating "I feel like I'm doing good". Feels like current medications are helpful. Taking medications as prescribed.  Energy levels improved. Active, does not have a regular exercise routine. Enjoys some usual interests and activities. Married. Lives with husband. Has 2 sons - 31 and 39 - lost her daughter at age 40. Has 5 grandchildren. Spending time with family.  Appetite adequate. Gastric bypass in 2003. Weight stable - 110 pounds. Reports sleeping better some nights than others. Averages 7 to 8 hours.  Reports focus and concentration difficulties. Completing tasks. Managing aspects of household. Retired.  Denies SI or HI.  Denies AH or VH. Denies self harm. Denies substance use.    GAD-7    Flowsheet Row Counselor from 06/12/2023 in [facility]  Total GAD-7 Score 10      PHQ2-9    Flowsheet Row Counselor from 06/12/2023 in [facility]  PHQ-2 Total Score 4  PHQ-9 Total Score 17      Flowsheet Row FERAHEME  from 04/11/2021 in [facility]  C-SSRS RISK CATEGORY No Risk        Review of Systems:  Review of Systems  Musculoskeletal:  Negative for gait problem.  Neurological:  Negative for tremors.  Psychiatric/Behavioral:         Please refer to HPI    Medications: I have  reviewed the patient's current medications.  Current Outpatient Medications  Medication Sig Dispense Refill   acetaminophen  (TYLENOL ) 650 MG CR tablet Take 650 mg by mouth as needed for pain.      amLODipine  (NORVASC ) 5 MG tablet Take 5 mg by mouth at bedtime.      buPROPion  (WELLBUTRIN  XL) 300 MG 24 hr tablet Take 1 tablet (300 mg total) by mouth daily. 90 tablet 1   clopidogrel  (PLAVIX ) 75 MG tablet TAKE 1 TABLET (75 MG TOTAL) BY MOUTH DAILY. PLEASE SCHEDULE APPOINTMENT FOR REFILLS. 7 tablet 0   DULoxetine  (CYMBALTA ) 60 MG capsule Take 1 capsule (60 mg total) by mouth 2 (two) times daily. 180 capsule 1   enalapril  (VASOTEC ) 5 MG tablet Take 5 mg by mouth at bedtime.      Evolocumab (REPATHA) 140 MG/ML SOSY Inject 140 mg into the skin every 14 (fourteen) days.     furosemide  (LASIX ) 20 MG tablet Take 1 tablet (20 mg total) by mouth daily. Be sure to eat potassium rich foods while taking Furosemide . 90 tablet 3   LUMIGAN 0.01 % SOLN Place 1 drop into both eyes at bedtime.     meclizine (ANTIVERT) 25 MG tablet Take 25 mg by mouth 3 (three) times daily as needed.     methocarbamol  (ROBAXIN ) 500 MG tablet Take 500 mg by mouth every 6 (six) hours.     metoCLOPramide  (REGLAN ) 5 MG tablet Take 5 mg by mouth every 6 (six) hours as needed.      Multiple Vitamin (  MULTIVITAMIN WITH MINERALS) TABS Take 2 tablets by mouth daily.     pantoprazole  (PROTONIX ) 40 MG tablet Take 40 mg by mouth daily with breakfast.     PRESCRIPTION MEDICATION Inject 1 application into the muscle. INJECTION every 21-30 days     timolol (TIMOPTIC) 0.5 % ophthalmic solution Place 1 drop into both eyes 2 (two) times daily.     traZODone  (DESYREL ) 50 MG tablet Take 1.5 tablets (75 mg total) by mouth at bedtime. 135 tablet 1   Turmeric (QC TUMERIC COMPLEX PO) Take 1 capsule by mouth daily at 6 (six) AM.     ursodiol (ACTIGALL) 300 MG capsule Take 300 mg by mouth daily.     Vitamin D , Ergocalciferol , (DRISDOL ) 50000 UNITS CAPS Take  50,000 Units by mouth every Monday, Wednesday, and Friday.      No current facility-administered medications for this visit.    Medication Side Effects: None  Allergies:  Allergies  Allergen Reactions   Aspirin  Other (See Comments)    PT CANNOT TAKE DUE TO HX OF GASTRIC BYPASS SURGERY    Loperamide Palpitations and Other (See Comments)   Statins     Other reaction(s): Other Joint pain   Erythromycin Other (See Comments)    UNKNOWN    Past Medical History:  Diagnosis Date   Acute MI (HCC) 11/2004   NONSTEMI 2005 NOINTERVENTION PER NOTE    Anemia    from bypass- iron and B12   Anxiety    Arthritis    Coronary artery disease    DDD (degenerative disc disease), cervical    Depression    DJD (degenerative joint disease)    Fibromyalgia    GERD (gastroesophageal reflux disease)    GIB (gastrointestinal bleeding)    while on celecoxib post bypass   Hyperlipidemia    Hyperlipidemia LDL goal < 130 01/23/2014   Hypertension    Obesity    hx of bipass, now resolved.  borderline diabetes also resolved.   Osteopenia    Post-menopausal    SCCA (squamous cell carcinoma) of skin 09/21/2015   left forearm medial cx3,16fu   SCCA (squamous cell carcinoma) of skin 07/26/2016   bridge of nose cx3 44fu   SCCA (squamous cell carcinoma) of skin 02/10/2020   right inner cheek tx after biopsy   SCCA (squamous cell carcinoma) of skin 02/10/2020   left hand tx after biopsy   Scoliosis    Sinusitis    Statin intolerance 01/23/2014    Past Medical History, Surgical history, Social history, and Family history were reviewed and updated as appropriate.   Please see review of systems for further details on the patient's review from today.   Objective:   Physical Exam:  LMP 03/18/1972 (Approximate)   Physical Exam Constitutional:      General: She is not in acute distress. Musculoskeletal:        General: No deformity.  Neurological:     Mental Status: She is alert and oriented to  person, place, and time.     Coordination: Coordination normal.  Psychiatric:        Attention and Perception: Attention and perception normal. She does not perceive auditory or visual hallucinations.        Mood and Affect: Mood normal. Mood is not anxious or depressed. Affect is not labile, blunt, angry or inappropriate.        Speech: Speech normal.        Behavior: Behavior normal.  Thought Content: Thought content normal. Thought content is not paranoid or delusional. Thought content does not include homicidal or suicidal ideation. Thought content does not include homicidal or suicidal plan.        Cognition and Memory: Cognition and memory normal.        Judgment: Judgment normal.     Comments: Insight intact     Lab Review:     Component Value Date/Time   NA 132 (L) 06/13/2015 0254   K 4.8 06/13/2015 0254   CL 100 (L) 06/13/2015 0254   CO2 23 06/13/2015 0254   GLUCOSE 143 (H) 06/13/2015 0254   BUN 14 06/13/2015 0254   CREATININE 0.85 06/13/2015 0254   CALCIUM  9.0 06/13/2015 0254   PROT 6.3 (L) 06/13/2015 0254   ALBUMIN 3.1 (L) 06/13/2015 0254   AST 27 06/13/2015 0254   ALT 46 06/13/2015 0254   ALKPHOS 183 (H) 06/13/2015 0254   BILITOT 1.1 06/13/2015 0254   GFRNONAA >60 06/13/2015 0254   GFRAA >60 06/13/2015 0254       Component Value Date/Time   WBC 10.9 (H) 06/12/2015 0402   RBC 4.18 06/12/2015 0402   HGB 12.5 06/12/2015 0402   HCT 36.7 06/12/2015 0402   PLT 279 06/12/2015 0402   MCV 87.8 06/12/2015 0402   MCH 29.9 06/12/2015 0402   MCHC 34.1 06/12/2015 0402   RDW 13.2 06/12/2015 0402   LYMPHSABS 1.4 06/11/2015 0451   MONOABS 0.8 06/11/2015 0451   EOSABS 0.0 06/11/2015 0451   BASOSABS 0.0 06/11/2015 0451    No results found for: "POCLITH", "LITHIUM "   No results found for: "PHENYTOIN", "PHENOBARB", "VALPROATE", "CBMZ"   .res Assessment: Plan:    Plan:  PDMP reviewed  Wellbutrin  XL 300mg  denies seizure history Cymbalta  60mg  twice  daily Trazdone 50mg  at hs  - working on increasing dose - may increase up to 100mg  at hs.  Completed adult ADD screening tool. Screening tool indicating possible ADD symptoms - referred to [facility] for further evaluation.  RTC 3 months  Patient advised to contact office with any questions, adverse effects, or acute worsening in signs and symptoms.   Diagnoses and all orders for this visit:  Major depressive disorder, recurrent episode, moderate (HCC) -     buPROPion  (WELLBUTRIN  XL) 300 MG 24 hr tablet; Take 1 tablet (300 mg total) by mouth daily. -     DULoxetine  (CYMBALTA ) 60 MG capsule; Take 1 capsule (60 mg total) by mouth 2 (two) times daily.  Generalized anxiety disorder -     buPROPion  (WELLBUTRIN  XL) 300 MG 24 hr tablet; Take 1 tablet (300 mg total) by mouth daily. -     DULoxetine  (CYMBALTA ) 60 MG capsule; Take 1 capsule (60 mg total) by mouth 2 (two) times daily.  Insomnia, unspecified type -     traZODone  (DESYREL ) 50 MG tablet; Take 1.5 tablets (75 mg total) by mouth at bedtime.  Attention deficit hyperactivity disorder (ADHD), predominantly inattentive type     Please see After Visit Summary for patient specific instructions.  Future Appointments  Date Time Provider Department Center  08/01/2024 11:00 AM Tevan Marian Nattalie, NP [facility] None    No orders of the defined types were placed in this encounter.   -------------------------------

## 2024-05-07 DIAGNOSIS — H26491 Other secondary cataract, right eye: Secondary | ICD-10-CM | POA: Diagnosis not present

## 2024-05-07 DIAGNOSIS — H3561 Retinal hemorrhage, right eye: Secondary | ICD-10-CM | POA: Diagnosis not present

## 2024-05-07 DIAGNOSIS — H04123 Dry eye syndrome of bilateral lacrimal glands: Secondary | ICD-10-CM | POA: Diagnosis not present

## 2024-05-07 DIAGNOSIS — H401232 Low-tension glaucoma, bilateral, moderate stage: Secondary | ICD-10-CM | POA: Diagnosis not present

## 2024-06-09 DIAGNOSIS — R197 Diarrhea, unspecified: Secondary | ICD-10-CM | POA: Diagnosis not present

## 2024-06-09 DIAGNOSIS — R9431 Abnormal electrocardiogram [ECG] [EKG]: Secondary | ICD-10-CM | POA: Diagnosis not present

## 2024-06-09 DIAGNOSIS — Z886 Allergy status to analgesic agent status: Secondary | ICD-10-CM | POA: Diagnosis not present

## 2024-06-09 DIAGNOSIS — I10 Essential (primary) hypertension: Secondary | ICD-10-CM | POA: Diagnosis not present

## 2024-06-09 DIAGNOSIS — M546 Pain in thoracic spine: Secondary | ICD-10-CM | POA: Diagnosis not present

## 2024-06-09 DIAGNOSIS — W19XXXA Unspecified fall, initial encounter: Secondary | ICD-10-CM | POA: Diagnosis not present

## 2024-06-09 DIAGNOSIS — R296 Repeated falls: Secondary | ICD-10-CM | POA: Diagnosis not present

## 2024-06-09 DIAGNOSIS — R519 Headache, unspecified: Secondary | ICD-10-CM | POA: Diagnosis not present

## 2024-06-09 DIAGNOSIS — R7989 Other specified abnormal findings of blood chemistry: Secondary | ICD-10-CM | POA: Diagnosis not present

## 2024-06-09 DIAGNOSIS — Z7902 Long term (current) use of antithrombotics/antiplatelets: Secondary | ICD-10-CM | POA: Diagnosis not present

## 2024-06-09 DIAGNOSIS — M47812 Spondylosis without myelopathy or radiculopathy, cervical region: Secondary | ICD-10-CM | POA: Diagnosis not present

## 2024-06-09 DIAGNOSIS — Z79899 Other long term (current) drug therapy: Secondary | ICD-10-CM | POA: Diagnosis not present

## 2024-06-09 DIAGNOSIS — R634 Abnormal weight loss: Secondary | ICD-10-CM | POA: Diagnosis not present

## 2024-06-09 DIAGNOSIS — S0990XA Unspecified injury of head, initial encounter: Secondary | ICD-10-CM | POA: Diagnosis not present

## 2024-06-09 DIAGNOSIS — M542 Cervicalgia: Secondary | ICD-10-CM | POA: Diagnosis not present

## 2024-06-09 DIAGNOSIS — Z87891 Personal history of nicotine dependence: Secondary | ICD-10-CM | POA: Diagnosis not present

## 2024-06-09 DIAGNOSIS — Z681 Body mass index (BMI) 19 or less, adult: Secondary | ICD-10-CM | POA: Diagnosis not present

## 2024-06-09 DIAGNOSIS — R269 Unspecified abnormalities of gait and mobility: Secondary | ICD-10-CM | POA: Diagnosis not present

## 2024-06-09 DIAGNOSIS — Z951 Presence of aortocoronary bypass graft: Secondary | ICD-10-CM | POA: Diagnosis not present

## 2024-06-09 DIAGNOSIS — Z883 Allergy status to other anti-infective agents status: Secondary | ICD-10-CM | POA: Diagnosis not present

## 2024-06-09 DIAGNOSIS — Z888 Allergy status to other drugs, medicaments and biological substances status: Secondary | ICD-10-CM | POA: Diagnosis not present

## 2024-06-09 DIAGNOSIS — E876 Hypokalemia: Secondary | ICD-10-CM | POA: Diagnosis not present

## 2024-06-09 DIAGNOSIS — Z9884 Bariatric surgery status: Secondary | ICD-10-CM | POA: Diagnosis not present

## 2024-06-10 DIAGNOSIS — M5411 Radiculopathy, occipito-atlanto-axial region: Secondary | ICD-10-CM | POA: Diagnosis not present

## 2024-06-10 DIAGNOSIS — M9901 Segmental and somatic dysfunction of cervical region: Secondary | ICD-10-CM | POA: Diagnosis not present

## 2024-06-11 DIAGNOSIS — M9901 Segmental and somatic dysfunction of cervical region: Secondary | ICD-10-CM | POA: Diagnosis not present

## 2024-06-11 DIAGNOSIS — M5411 Radiculopathy, occipito-atlanto-axial region: Secondary | ICD-10-CM | POA: Diagnosis not present

## 2024-06-12 DIAGNOSIS — G319 Degenerative disease of nervous system, unspecified: Secondary | ICD-10-CM | POA: Diagnosis not present

## 2024-06-12 DIAGNOSIS — I352 Nonrheumatic aortic (valve) stenosis with insufficiency: Secondary | ICD-10-CM | POA: Diagnosis not present

## 2024-06-12 DIAGNOSIS — R0602 Shortness of breath: Secondary | ICD-10-CM | POA: Diagnosis not present

## 2024-06-12 DIAGNOSIS — R413 Other amnesia: Secondary | ICD-10-CM | POA: Diagnosis not present

## 2024-06-12 DIAGNOSIS — I2510 Atherosclerotic heart disease of native coronary artery without angina pectoris: Secondary | ICD-10-CM | POA: Diagnosis not present

## 2024-06-12 DIAGNOSIS — R634 Abnormal weight loss: Secondary | ICD-10-CM | POA: Diagnosis not present

## 2024-06-12 DIAGNOSIS — R269 Unspecified abnormalities of gait and mobility: Secondary | ICD-10-CM | POA: Diagnosis not present

## 2024-06-12 DIAGNOSIS — K8689 Other specified diseases of pancreas: Secondary | ICD-10-CM | POA: Diagnosis not present

## 2024-06-16 ENCOUNTER — Encounter (HOSPITAL_BASED_OUTPATIENT_CLINIC_OR_DEPARTMENT_OTHER): Payer: Self-pay

## 2024-06-18 ENCOUNTER — Encounter: Payer: Self-pay | Admitting: Neurology

## 2024-06-18 ENCOUNTER — Ambulatory Visit: Attending: Cardiovascular Disease | Admitting: Cardiovascular Disease

## 2024-06-18 ENCOUNTER — Encounter: Payer: Self-pay | Admitting: Cardiovascular Disease

## 2024-06-18 ENCOUNTER — Ambulatory Visit (INDEPENDENT_AMBULATORY_CARE_PROVIDER_SITE_OTHER): Admitting: Neurology

## 2024-06-18 VITALS — BP 126/76 | HR 75 | Ht 62.0 in | Wt 110.0 lb

## 2024-06-18 VITALS — BP 126/68 | HR 77 | Ht 62.0 in | Wt 111.0 lb

## 2024-06-18 DIAGNOSIS — R42 Dizziness and giddiness: Secondary | ICD-10-CM | POA: Diagnosis not present

## 2024-06-18 DIAGNOSIS — I2510 Atherosclerotic heart disease of native coronary artery without angina pectoris: Secondary | ICD-10-CM | POA: Insufficient documentation

## 2024-06-18 DIAGNOSIS — I350 Nonrheumatic aortic (valve) stenosis: Secondary | ICD-10-CM | POA: Diagnosis present

## 2024-06-18 DIAGNOSIS — Z01812 Encounter for preprocedural laboratory examination: Secondary | ICD-10-CM | POA: Insufficient documentation

## 2024-06-18 DIAGNOSIS — I10 Essential (primary) hypertension: Secondary | ICD-10-CM | POA: Insufficient documentation

## 2024-06-18 DIAGNOSIS — R011 Cardiac murmur, unspecified: Secondary | ICD-10-CM | POA: Diagnosis not present

## 2024-06-18 DIAGNOSIS — T466X5A Adverse effect of antihyperlipidemic and antiarteriosclerotic drugs, initial encounter: Secondary | ICD-10-CM | POA: Diagnosis not present

## 2024-06-18 DIAGNOSIS — Z9181 History of falling: Secondary | ICD-10-CM | POA: Diagnosis not present

## 2024-06-18 DIAGNOSIS — T466X5D Adverse effect of antihyperlipidemic and antiarteriosclerotic drugs, subsequent encounter: Secondary | ICD-10-CM | POA: Diagnosis not present

## 2024-06-18 DIAGNOSIS — E7800 Pure hypercholesterolemia, unspecified: Secondary | ICD-10-CM | POA: Diagnosis present

## 2024-06-18 DIAGNOSIS — R269 Unspecified abnormalities of gait and mobility: Secondary | ICD-10-CM | POA: Diagnosis not present

## 2024-06-18 DIAGNOSIS — R2689 Other abnormalities of gait and mobility: Secondary | ICD-10-CM | POA: Diagnosis not present

## 2024-06-18 DIAGNOSIS — G720 Drug-induced myopathy: Secondary | ICD-10-CM | POA: Diagnosis not present

## 2024-06-18 LAB — CBC

## 2024-06-18 NOTE — Patient Instructions (Signed)
 Lab Work: CBC, BMP- today If you have labs (blood work) drawn today and your tests are completely normal, you will receive your results only by: MyChart Message (if you have MyChart) OR A paper copy in the mail If you have any lab test that is abnormal or we need to change your treatment, we will call you to review the results.  Testing/Procedures: Your physician has requested that you have a cardiac catheterization. Cardiac catheterization is used to diagnose and/or treat various heart conditions. Doctors may recommend this procedure for a number of different reasons. The most common reason is to evaluate chest pain. Chest pain can be a symptom of coronary artery disease (CAD), and cardiac catheterization can show whether plaque is narrowing or blocking your heart's arteries. This procedure is also used to evaluate the valves, as well as measure the blood flow and oxygen levels in different parts of your heart. For further information please visit [email or web address] Please follow instruction sheet, as given.   Follow-Up: At [facility], you and your health needs are our priority.  As part of our continuing mission to provide you with exceptional heart care, our providers are all part of one team.  This team includes your primary Cardiologist (physician) and Advanced Practice Providers or APPs (Physician Assistants and Nurse Practitioners) who all work together to provide you with the care you need, when you need it.  Your next appointment:   6 month(s)  Provider:   Jerel Balding, MD    We recommend signing up for the patient portal called MyChart.  Sign up information is provided on this After Visit Summary.  MyChart is used to connect with patients for Virtual Visits (Telemedicine).  Patients are able to view lab/test results, encounter notes, upcoming appointments, etc.  Non-urgent messages can be sent to your provider as well.   To learn more about what you can do with MyChart,  go to [email or web address].         Cardiac/Peripheral Catheterization   You are scheduled for a Cardiac Catheterization on Friday, July 11 with Dr. Lonni Cash.  1. Please arrive at the [facility] (Main Entrance A) at [facility]: [address] [place], [place] [address] at 5:30 AM (This time is two hour(s) before your procedure to ensure your preparation).   Free valet parking service is available. You will check in at ADMITTING. The support person will be asked to wait in the waiting room.  It is OK to have someone drop you off and come back when you are ready to be discharged.        Special note: Every effort is made to have your procedure done on time. Please understand that emergencies sometimes delay scheduled procedures.  2. Diet: Do not eat solid foods after midnight.  You may have clear liquids until 5 AM the day of the procedure.  3. Labs: TODAY  4. Medication instructions in preparation for your procedure:   Contrast Allergy: No  DO NOT TAKE FUROSEMIDE /Lasix  the morning of procedure On the morning of your procedure, take Plavix /Clopidogrel  and any morning medicines NOT listed above.  You may use sips of water.  5. Plan to go home the same day, you will only stay overnight if medically necessary. 6. You MUST have a responsible adult to drive you home. 7. An adult MUST be with you the first 24 hours after you arrive home. 8. Bring a current list of your medications, and the last time and date medication  taken. 9. Bring ID and current insurance cards. 10.Please wear clothes that are easy to get on and off and wear slip-on shoes.  Thank you for allowing us  to care for you!   -- [facility]

## 2024-06-18 NOTE — Progress Notes (Signed)
 Subjective:    Patient ID: Kimberly Stone is a 78 y.o. female.  HPI    True Mar, MD, PhD [facility] [address] [address] [place], [place] [address]  Dear Corean,  I saw your patient, Kimberly Stone, upon your kind request in my neurologic clinic today for initial consultation of her gait and balance disorder, history of falls.  The patient is accompanied by her husband today.  As you know, Kimberly Stone is a 78 year old female with an underlying complex medical history of reflux disease, pancreatic insufficiency, history of diarrhea, hypertension, iron deficiency, orthostatic dizziness, osteopenia, anxiety, depression, fibromyalgia, obesity, hyperlipidemia, cervical radiculopathy, hearing loss, TIA versus TGA, coronary artery disease, history of MI, aortic stenosis, and scoliosis, with status post gastric bypass surgery, who reports a 2-3 mo Hx of intermittent dizziness, balance problem and recurrent falls.  She has fallen about 3 or 4 times in the past 2 months as she recalls.  She fell in late  doing twice within 2 days.  She does not use a walker or any walking aid.  She tries to hydrate well with water, she limits her caffeine but generally does drink 2 cups of coffee and 2 cups of tea per day.  She does not drink any alcohol  except maybe once a month.  She does take her supplements since she had gastric bypass several years ago.  She usually takes vitamin C, vitamin D  every other day and vitamin B12.  She is on multiple potentially sedating and psychotropic medications including Tylenol  3, meclizine, Robaxin , Reglan , trazodone , and Cymbalta  as well as Wellbutrin .  Most of her medications she takes as needed but incidentally, she did take meclizine when she fell and hit her head. I reviewed your office note from 06/12/2024.  She was noted to have a benign exam at the time with normal gait and strength noted.  She did not have any blood work at the time.     She had recently presented to the emergency room through [facility] on 06/09/2024 with diarrhea for 1 week, intermittent dizziness and a fall.  She reported hitting her head and presented with neck and back pain.  I reviewed the emergency room record.  She reported a significant amount of weight loss of about 30 pounds in 1 year. She had a head CT without contrast and cervical spine CT without contrast on 06/09/2024 and I reviewed the results:  IMPRESSION:  1. No acute intracranial abnormality.   2.  Mild, diffuse cerebral atrophy.  3.  No acute fracture of the cervical spine.  4.  Moderate cervical spondylosis with apparent degenerative localization of C4 on C5 and mild neural foraminal narrowing    I had evaluated her several years ago, over 10 years ago for sleep apnea concern.  A sleep study through our sleep lab in July 2014 showed no significant sleep disordered breathing with an AHI of 1/h, O2 nadir 94%.   CMP on 06/09/2024 was benign.Her magnesium  level was 1.9, within range.  Lipase was normal at 32.  She reports that when she gets dizzy, sometimes her lips turn purple and she is wondering if it is related to her heart condition.  She has a history of aortic stenosis and is supposed to have a TAVR soon.  In fact, she may have surgery next week according to her electronic chart.  She is scheduled for a heart catheterization on 06/27/2024.  Previously (copied from previous notes for reference):  06/23/2013: 78 year old right-handed woman with an underlying medical history of hypertension, chronic pain, fibromyalgia, heart disease status post MI in 2006, major depression and general anxiety disorder who has suffered from daytime somnolence for over 10 years. It is very difficult for her to awaken in the morning. She takes prolonged naps during the day. Her surgical history includes a gastric bypass surgery in 2003. Her current medications are Wellbutrin  XL, Lexapro, lorazepam .     Her  typical bedtime is reported to be around 9:30 to 10 PM and usual wake time is around 6:30-7 AM. Sleep onset typically occurs within 45 minutes. She reports feeling fairly well rested upon awakening, but by 10:30-12:00 she becomes overwhelmingly sleepy and has to take a nap, which is usually about 2 h long. She does not dream during a nap. She wakes up on an average 2-3 in the middle of the night and has to go to the bathroom 1 times on a typical night. She denies morning headaches.  She reports excessive daytime somnolence (EDS) and Her Epworth Sleepiness Score (ESS) is 10/24 today. She has not fallen asleep while driving.   She has been known to snore for the past many years. Snoring is reportedly mild to moderate, and no witnessed apneas are reported by her husband. The patient denies a sense of choking or strangling feeling. There is report of nighttime reflux, with rare nighttime cough experienced. The patient has noted mild RLS symptoms and is not known to kick while asleep or before falling asleep. She is not a very restless sleeper.   She denies cataplexy, sleep paralysis, hypnagogic or hypnopompic hallucinations, or sleep attacks. She does not report any vivid dreams, nightmares, dream enactments, or parasomnias, such as sleep talking or sleep walking. The patient has not had a sleep study or a home sleep test.  She consumes 3 caffeinated beverage per day, usually in the form of coffee, 2 cups of coffee in AM and 1 at dinner.           Her Past Medical History Is Significant For: Past Medical History:  Diagnosis Date   Acute MI (HCC) 11/2004   NONSTEMI 2005 NOINTERVENTION PER NOTE    Anemia    from bypass- iron and B12   Anxiety    Arthritis    Coronary artery disease    DDD (degenerative disc disease), cervical    Depression    DJD (degenerative joint disease)    Fibromyalgia    GERD (gastroesophageal reflux disease)    GIB (gastrointestinal bleeding)    while on celecoxib post  bypass   Hyperlipidemia    Hyperlipidemia LDL goal < 130 01/23/2014   Hypertension    Obesity    hx of bipass, now resolved.  borderline diabetes also resolved.   Osteopenia    Post-menopausal    SCCA (squamous cell carcinoma) of skin 09/21/2015   left forearm medial cx3,32fu   SCCA (squamous cell carcinoma) of skin 07/26/2016   bridge of nose cx3 13fu   SCCA (squamous cell carcinoma) of skin 02/10/2020   right inner cheek tx after biopsy   SCCA (squamous cell carcinoma) of skin 02/10/2020   left hand tx after biopsy   Scoliosis    Sinusitis    Statin intolerance 01/23/2014    Her Past Surgical History Is Significant For: Past Surgical History:  Procedure Laterality Date   CARDIAC CATHETERIZATION     2005 Severely disease small optional diagonal vessel responsible far wall motion abnormalities wise to  small and diffusely diseased for consideration of interventio.   CATARACT EXTRACTION     COLONOSCOPY     GASTRIC BYPASS  01/2004   KNEE ARTHROSCOPY Right    LAPAROSCOPIC CHOLECYSTECTOMY  1993   TOTAL VAGINAL HYSTERECTOMY  1973   secondary to DUB - Dr. Elsa and Dr. Claudene    Her Family History Is Significant For: Family History  Problem Relation Age of Onset   Heart failure Mother    Liver cancer Father    Cancer Brother    Heart failure Maternal Grandmother    Aneurysm Maternal Grandmother        ruptured abdominal aortic aneurysm    Her Social History Is Significant For: Social History   Socioeconomic History   Marital status: Married    Spouse name: Eric   Number of children: 3   Years of education: LPN   Highest education level: Not on file  Occupational History   Occupation: Retired  Tobacco Use   Smoking status: Former    Current packs/day: 0.00    Average packs/day: 0.3 packs/day for 5.0 years (1.3 ttl pk-yrs)    Types: Cigarettes    Start date: 67    Quit date: 12/18/1996    Years since quitting: 27.5   Smokeless tobacco: Never  Vaping Use   Vaping  status: Never Used  Substance and Sexual Activity   Alcohol  use: Yes    Alcohol /week: 1.0 - 2.0 standard drink of alcohol     Types: 1 - 2 Glasses of wine per week    Comment: rare wine   Drug use: No   Sexual activity: Not Currently    Birth control/protection: Surgical    Comment: hysterectomy-1973  Other Topics Concern   Not on file  Social History Narrative   Pt lives at home with husband.  Drives and does not use assist device.  Caffeine Use: 2 cups daily   Social Drivers of Health   Financial Resource Strain: Low Risk  (06/12/2023)   Overall Financial Resource Strain (CARDIA)    Difficulty of Paying Living Expenses: Not hard at all  Food Insecurity: Not on file  Transportation Needs: Not on file  Physical Activity: Inactive (06/12/2023)   Exercise Vital Sign    Days of Exercise per Week: 0 days    Minutes of Exercise per Session: 0 min  Stress: Stress Concern Present (06/12/2023)   Kimberly Stone [employer] - Occupational Stress Questionnaire    Feeling of Stress : Very much  Social Connections: Socially Integrated (06/12/2023)   Social Connection and Isolation Panel    Frequency of Communication with Friends and Family: More than three times a week    Frequency of Social Gatherings with Friends and Family: More than three times a week    Attends Religious Services: More than 4 times per year    Active Member of [employer] or Organizations: Yes    Attends Engineer, structural: Not on file    Marital Status: Married    Her Allergies Are:  Allergies  Allergen Reactions   Aspirin  Other (See Comments)    PT CANNOT TAKE DUE TO HX OF GASTRIC BYPASS SURGERY    Loperamide Palpitations and Other (See Comments)   Statins     Other reaction(s): Other Joint pain   Atorvastatin      Other Reaction(s): elevated LFTs   Beta Adrenergic Blockers     Other Reaction(s): dyspnea   Celecoxib     Other Reaction(s): UGI bleed  Desvenlafaxine     Other Reaction(s):  racing, twitching, everything going too fast.   Ezetimibe     Other Reaction(s): liver  panel went up   Loperamide Hcl     Other Reaction(s): heart palpitations   Nsaids     Other Reaction(s): prior GI bleed   Rosuvastatin     Other Reaction(s): elevated LFTs   Simvastatin     Other Reaction(s): elevated LF's, elevated LFTs, muscle problems   Erythromycin Other (See Comments)    UNKNOWN  :   Her Current Medications Are:  Outpatient Encounter Medications as of 06/18/2024  Medication Sig   acetaminophen  (TYLENOL ) 650 MG CR tablet Take 650 mg by mouth as needed for pain.    acetaminophen -codeine (TYLENOL  #3) 300-30 MG tablet Take 1 tablet by mouth every 6 (six) hours as needed.   buPROPion  (WELLBUTRIN  XL) 300 MG 24 hr tablet Take 1 tablet (300 mg total) by mouth daily.   clopidogrel  (PLAVIX ) 75 MG tablet TAKE 1 TABLET (75 MG TOTAL) BY MOUTH DAILY. PLEASE SCHEDULE APPOINTMENT FOR REFILLS.   diphenoxylate-atropine (LOMOTIL) 2.5-0.025 MG tablet Take 1 tablet by mouth 4 (four) times daily as needed.   DULoxetine  (CYMBALTA ) 60 MG capsule Take 1 capsule (60 mg total) by mouth 2 (two) times daily.   enalapril  (VASOTEC ) 5 MG tablet Take 5 mg by mouth at bedtime.    Evolocumab (REPATHA) 140 MG/ML SOSY Inject 140 mg into the skin every 14 (fourteen) days.   famotidine (PEPCID) 20 MG tablet Take 20 mg by mouth as needed.   furosemide  (LASIX ) 20 MG tablet Take 1 tablet (20 mg total) by mouth daily. Be sure to eat potassium rich foods while taking Furosemide .   LUMIGAN 0.01 % SOLN Place 1 drop into both eyes at bedtime.   meclizine (ANTIVERT) 25 MG tablet Take 25 mg by mouth 3 (three) times daily as needed.   methocarbamol  (ROBAXIN ) 500 MG tablet Take 500 mg by mouth every 6 (six) hours.   metoCLOPramide  (REGLAN ) 5 MG tablet Take 5 mg by mouth every 6 (six) hours as needed.    Multiple Vitamin (MULTIVITAMIN WITH MINERALS) TABS Take 2 tablets by mouth daily.   pantoprazole  (PROTONIX ) 40 MG tablet Take  40 mg by mouth daily with breakfast.   timolol (TIMOPTIC) 0.5 % ophthalmic solution Place 1 drop into both eyes 2 (two) times daily.   traZODone  (DESYREL ) 50 MG tablet Take 1.5 tablets (75 mg total) by mouth at bedtime.   Turmeric (QC TUMERIC COMPLEX PO) Take 1 capsule by mouth daily at 6 (six) AM.   ursodiol (ACTIGALL) 300 MG capsule Take 300 mg by mouth daily.   Vitamin D , Ergocalciferol , (DRISDOL ) 50000 UNITS CAPS Take 50,000 Units by mouth every Monday, Wednesday, and Friday.    lipase/protease/amylase (CREON) 36000 UNITS CPEP capsule Take 2 capsules by mouth. (Patient not taking: Reported on 06/18/2024)   PRESCRIPTION MEDICATION Inject 1 application into the muscle. INJECTION every 21-30 days   [DISCONTINUED] amLODipine  (NORVASC ) 5 MG tablet Take 5 mg by mouth at bedtime.  (Patient not taking: Reported on 06/18/2024)   No facility-administered encounter medications on file as of 06/18/2024.  :   Review of Systems:  Out of a complete 14 point review of systems, all are reviewed and negative with the exception of these symptoms as listed below:  Review of Systems  Neurological:        Pt here for balance/falls (hit head)  She has some feelings of dizziness/ walking (leans).  She did see cardiology and has CC on Friday and heart valve replacement scheduled in August.  Orthostat done slight dizzy on stand, but 3 min stable.  ESS  FSS (doing in room)    Objective:  Neurological Exam  Physical Exam Physical Examination:   Vitals:   06/18/24 1238  BP: 126/68  Pulse: 77   On orthostatic testing she had no significant orthostatic blood pressure drop but felt slightly dizzy with standing.  General Examination: The patient is a very pleasant 78 y.o. female in no acute distress. She appears frail, well-groomed, good spirits.   HEENT: Normocephalic, atraumatic, pupils are equal, round and reactive to light, extraocular tracking is good without limitation to gaze excursion or nystagmus noted.  Hearing is grossly intact with bilateral hearing aids in place. Face is symmetric with normal facial animation. Speech is clear with no dysarthria noted. There is no hypophonia. There is no lip, neck/head, jaw or voice tremor. Neck is supple with full range of passive and active motion. There are no carotid bruits but referred heart murmur to both carotids.   Oropharynx exam reveals: moderate mouth dryness, adequate dental hygiene.  Tongue protrudes centrally and palate elevates symmetrically.   Chest: Clear to auscultation without wheezing, rhonchi or crackles noted.  Heart: S1+S2+0, regular is a prominent pansystolic heart murmur noted.   Abdomen: Soft, non-tender and non-distended.  Extremities: There is no pitting edema in the distal lower extremities bilaterally.   Skin: Warm and dry without trophic changes noted.   Musculoskeletal: exam reveals no obvious joint deformities.   Neurologically:  Mental status: The patient is awake, alert and oriented in all 4 spheres. Her immediate and remote memory, attention, language skills and fund of knowledge are appropriate. There is no evidence of aphasia, agnosia, apraxia or anomia. Speech is clear with normal prosody and enunciation. Thought process is linear. Mood is normal and affect is normal.  Cranial nerves II - XII are as described above under HEENT exam.  Motor exam: Normal bulk, strength and tone is noted. There is no obvious action or resting tremor.  No drift or rebound, no intention or postural tremor. Fine motor skills and coordination: Grossly intact finger taps, hand movements and rapid alternating patting with both upper extremities, normal foot taps bilaterally in the lower extremities, but overall mild slowness noted, no lateralization. Reflexes 1+ throughout, toes are downgoing bilaterally.  Romberg negative. Cerebellar testing: No dysmetria or intention tremor. There is no truncal or gait ataxia.  Normal finger-to-nose, normal  heel-to-shin bilaterally. Sensory exam: intact to light touch, temperature and vibration sense in the upper and lower extremities.  Gait, station and balance: She stands without difficulty, does not require assistance.  She may stand a little bit wide-based.  She walks without a walking aid, she walks slowly and cautiously, no shuffling, has preserved arm swing.    Assessment and Plan:  In summary, EULONDA ANDALON is a very pleasant 78 y.o.-year old female with an underlying complex medical history of reflux disease, pancreatic insufficiency, history of diarrhea, hypertension, iron deficiency, orthostatic dizziness, osteopenia, anxiety, depression, fibromyalgia, obesity, hyperlipidemia, cervical radiculopathy, hearing loss, TIA versus TGA, coronary artery disease, history of MI, aortic stenosis, and scoliosis, with status post gastric bypass surgery, who presents for evaluation of her gait and balance problem of 2 to 3 months duration with history of about 3-4 falls within the past 2 months.  History and examination are not in keeping with orthostatic hypotension, weakness, parkinsonism, or gait ataxia.  She likely has a multifactorial gait disorder.  I had a long discussion with the patient and her husband regarding possible etiologies and the combination of factors that may contribute to her fall risk.  Below is a summary of my recommendations and our discussion points based on today's visit, per chart review in her electronic and paper chart and my exam today.  This was an extended visit of over 60 minutes with copious record review involved and considerable counseling and coordination of care.  They were given these instructions verbally during the appointment and also in writing in her after visit summary.   <<   I believe you have a multifactorial gait disorder, meaning, that it is lind is due to a combination of factors. These factors include: normal aging, degenerative arthritis of your back,  medication effects (tylenol  #3, meclizine, cymbalta , trazodone , robaxin , wellbutrin ), and the dizziness may tie in with your aortic stenosis. Please remember to stand up slowly and get your bearings first turn slowly, no bending down to pick anything, no heavy lifting, be extra careful at night and first thing in the morning. Also, be careful in the Bathroom and the kitchen. I recommend you start using a walker for gait safety. Remember to drink plenty of fluid, eat healthy meals and do not skip any meals. Try to eat protein with a every meal and eat a healthy snack such as fruit or nuts or yogurt in between meals. Try to keep a regular sleep-wake schedule and try to exercise daily, particularly in the form of walking, 20-30 minutes a day, if you can. Ask your primary to check your labs, including vitamin D , B12 and thyroid  function.  As far as your medications are concerned, I would like to suggest no new medications, in fact, I recommend you have your PCP critically review your meds and cut back as much as possible.  As far as diagnostic testing: I do not believe you need any additional test from my end at this time.  You can follow up with your primary care and other providers as scheduled/planned.>>  Thank you very much for allowing me to participate in the care of this nice patient. If I can be of any further assistance to you please do not hesitate to call me at [phone].  Sincerely,   True Mar, MD, PhD

## 2024-06-18 NOTE — H&P (View-Only) (Signed)
 Patient ID: Kimberly Stone, female   DOB: 11/28/1946, 78 y.o.   MRN: [id number]       06/18/2024  Reason for office visit AS, CAD, hyperlipidemia, statin intolerance   Kimberly Stone is a 78 year old woman with a history of coronary disease (presented with non-STEMI in 2005 related to severe diffuse disease in ramus intermedius, not amenable to PCI), calcific aortic stenosis, LBBB, hypertension, hyperlipidemia, GERD, resolution of obesity and prediabetes after gastric bypass surgery.  Despite previous cholecystectomy she had common bile duct obstruction with pancreatitis in 2022.  She has recovered well from this.  She is experience further worsening of exertional dyspnea, this now occurs when making the bed.  Her husband even notices her lips turning a little blue.  She has to stop and rest a lot.  She has not had chest pain or syncope.  She does not have any dyspnea at rest, orthopnea, PND or lower extremity edema.  Treatment with furosemide  has not really helped.  Her most recent echocardiogram was interpreted as showing moderate aortic stenosis, but probably meets criteria for paradoxical low-flow low gradient severe aortic stenosis: She has normal LV systolic function, grade 2 diastolic dysfunction, mean aortic valve gradient 22 mmHg, peak velocity 3.1 m/s, dimensionless index 0.24).  She has very small body size (5 foot 2, 114 pounds) which makes her a typical candidate for paradoxical low-flow low gradient AS.  Left ventricular stroke-volume index was borderline for this diagnosis at 37 mL/min/m.  Metabolic control is fair with an LDL cholesterol of 79, HDL 63 and normal glucose levels.  She has a history of intolerance to multiple lipid-lowering agents due to either myopathy or liver test abnormalities (simvastatin, atorvastatin , rosuvastatin, pitavastatin, ezetimibe) and is managed on PCSK9 inhibitors.  In the past her LDL cholesterol was even better around 50.  She has bilateral carotid  bruits that appear to be radiating from the aortic stenosis murmur in her chest.  She had duplex ultrasound of the carotid arteries in October 2022 that did not show any significant stenoses and relatively minor plaque.   Allergies  Allergen Reactions   Aspirin  Other (See Comments)    PT CANNOT TAKE DUE TO HX OF GASTRIC BYPASS SURGERY    Loperamide Palpitations and Other (See Comments)   Statins     Other reaction(s): Other Joint pain   Atorvastatin      Other Reaction(s): elevated LFTs   Beta Adrenergic Blockers     Other Reaction(s): dyspnea   Celecoxib     Other Reaction(s): UGI bleed   Desvenlafaxine     Other Reaction(s): racing, twitching, everything going too fast.   Ezetimibe     Other Reaction(s): liver  panel went up   Loperamide Hcl     Other Reaction(s): heart palpitations   Nsaids     Other Reaction(s): prior GI bleed   Rosuvastatin     Other Reaction(s): elevated LFTs   Simvastatin     Other Reaction(s): elevated LF's, elevated LFTs, muscle problems   Erythromycin Other (See Comments)    UNKNOWN    Current Outpatient Medications  Medication Sig Dispense Refill   acetaminophen  (TYLENOL ) 650 MG CR tablet Take 650 mg by mouth as needed for pain.      acetaminophen -codeine (TYLENOL  #3) 300-30 MG tablet Take 1 tablet by mouth every 6 (six) hours as needed.     buPROPion  (WELLBUTRIN  XL) 300 MG 24 hr tablet Take 1 tablet (300 mg total) by mouth daily. 90 tablet 1  clopidogrel  (PLAVIX ) 75 MG tablet TAKE 1 TABLET (75 MG TOTAL) BY MOUTH DAILY. PLEASE SCHEDULE APPOINTMENT FOR REFILLS. 7 tablet 0   diphenoxylate-atropine (LOMOTIL) 2.5-0.025 MG tablet Take 1 tablet by mouth 4 (four) times daily as needed.     DULoxetine  (CYMBALTA ) 60 MG capsule Take 1 capsule (60 mg total) by mouth 2 (two) times daily. 180 capsule 1   enalapril  (VASOTEC ) 5 MG tablet Take 5 mg by mouth at bedtime.      Evolocumab (REPATHA) 140 MG/ML SOSY Inject 140 mg into the skin every 14 (fourteen) days.      famotidine (PEPCID) 20 MG tablet Take 20 mg by mouth as needed.     furosemide  (LASIX ) 20 MG tablet Take 1 tablet (20 mg total) by mouth daily. Be sure to eat potassium rich foods while taking Furosemide . 90 tablet 3   lipase/protease/amylase (CREON) 36000 UNITS CPEP capsule Take 2 capsules by mouth.     LUMIGAN 0.01 % SOLN Place 1 drop into both eyes at bedtime.     meclizine (ANTIVERT) 25 MG tablet Take 25 mg by mouth 3 (three) times daily as needed.     metoCLOPramide  (REGLAN ) 5 MG tablet Take 5 mg by mouth every 6 (six) hours as needed.      Multiple Vitamin (MULTIVITAMIN WITH MINERALS) TABS Take 2 tablets by mouth daily.     pantoprazole  (PROTONIX ) 40 MG tablet Take 40 mg by mouth daily with breakfast.     timolol (TIMOPTIC) 0.5 % ophthalmic solution Place 1 drop into both eyes 2 (two) times daily.     traZODone  (DESYREL ) 50 MG tablet Take 1.5 tablets (75 mg total) by mouth at bedtime. 135 tablet 1   Turmeric (QC TUMERIC COMPLEX PO) Take 1 capsule by mouth daily at 6 (six) AM.     ursodiol (ACTIGALL) 300 MG capsule Take 300 mg by mouth daily.     Vitamin D , Ergocalciferol , (DRISDOL ) 50000 UNITS CAPS Take 50,000 Units by mouth every Monday, Wednesday, and Friday.      amLODipine  (NORVASC ) 5 MG tablet Take 5 mg by mouth at bedtime.  (Patient not taking: Reported on 06/18/2024)     methocarbamol  (ROBAXIN ) 500 MG tablet Take 500 mg by mouth every 6 (six) hours. (Patient not taking: Reported on 06/18/2024)     PRESCRIPTION MEDICATION Inject 1 application into the muscle. INJECTION every 21-30 days     No current facility-administered medications for this visit.    Past Medical History:  Diagnosis Date   Acute MI (HCC) 11/2004   NONSTEMI 2005 NOINTERVENTION PER NOTE    Anemia    from bypass- iron and B12   Anxiety    Arthritis    Coronary artery disease    DDD (degenerative disc disease), cervical    Depression    DJD (degenerative joint disease)    Fibromyalgia    GERD  (gastroesophageal reflux disease)    GIB (gastrointestinal bleeding)    while on celecoxib post bypass   Hyperlipidemia    Hyperlipidemia LDL goal < 130 01/23/2014   Hypertension    Obesity    hx of bipass, now resolved.  borderline diabetes also resolved.   Osteopenia    Post-menopausal    SCCA (squamous cell carcinoma) of skin 09/21/2015   left forearm medial cx3,32fu   SCCA (squamous cell carcinoma) of skin 07/26/2016   bridge of nose cx3 65fu   SCCA (squamous cell carcinoma) of skin 02/10/2020   right inner cheek tx after biopsy  SCCA (squamous cell carcinoma) of skin 02/10/2020   left hand tx after biopsy   Scoliosis    Sinusitis    Statin intolerance 01/23/2014    Past Surgical History:  Procedure Laterality Date   CARDIAC CATHETERIZATION     2005 Severely disease small optional diagonal vessel responsible far wall motion abnormalities wise to small and diffusely diseased for consideration of interventio.   CATARACT EXTRACTION     COLONOSCOPY     GASTRIC BYPASS  01/2004   KNEE ARTHROSCOPY Right    LAPAROSCOPIC CHOLECYSTECTOMY  1993   TOTAL VAGINAL HYSTERECTOMY  1973   secondary to DUB - Dr. Elsa and Dr. Claudene    Family History  Problem Relation Age of Onset   Heart failure Mother    Liver cancer Father    Cancer Brother    Heart failure Maternal Grandmother    Aneurysm Maternal Grandmother        ruptured abdominal aortic aneurysm    Review of systems: Please see history of present illness.  Denies other complaints.   PHYSICAL EXAM BP 126/76 (BP Location: Left Arm, Patient Position: Sitting, Cuff Size: Normal)   Pulse 75   Ht 5' 2 (1.575 m)   Wt 110 lb (49.9 kg)   LMP 03/18/1972 (Approximate)   SpO2 98%   BMI 20.12 kg/m     General: Alert, oriented x3, no distress, appears very lean and fit Head: no evidence of trauma, PERRL, EOMI, no exophtalmos or lid lag, no myxedema, no xanthelasma; normal ears, nose and oropharynx Neck: normal jugular venous  pulsations and no hepatojugular reflux; brisk carotid pulses without delay and no carotid bruits Chest: clear to auscultation, no signs of consolidation by percussion or palpation, normal fremitus, symmetrical and full respiratory excursions Cardiovascular: normal position and quality of the apical impulse, regular rhythm, normal first and diminished second heart sounds, late peaking 3/6 aortic ejection murmur, no diastolic murmurs, rubs or gallops Abdomen: no tenderness or distention, no masses by palpation, no abnormal pulsatility or arterial bruits, normal bowel sounds, no hepatosplenomegaly Extremities: no clubbing, cyanosis or edema; 2+ radial, ulnar and brachial pulses bilaterally; 2+ right femoral, posterior tibial and dorsalis pedis pulses; 2+ left femoral, posterior tibial and dorsalis pedis pulses; no subclavian or femoral bruits Neurological: grossly nonfocal Psych: Normal mood and affect     Echocardiogram 04/17/2024:  1. Left ventricular ejection fraction, by estimation, is 55 to 60%. The  left ventricle has normal function. The left ventricle has no regional  wall motion abnormalities. Left ventricular diastolic parameters are  consistent with Grade II diastolic  dysfunction (pseudonormalization). Elevated left atrial pressure. The E/e'  is 16.   2. Right ventricular systolic function is normal. The right ventricular  size is normal. Tricuspid regurgitation signal is inadequate for assessing  PA pressure.   3. Left atrial size was moderately dilated.   4. The mitral valve is degenerative. Mild mitral valve regurgitation. No  evidence of mitral stenosis.   5. Native aortic valve, trileaflet, calcified leaflets and cups, reduced  excursion, moderate to severe aortic stenosis (peak velocity at RSB  3.102m/s, MG 22 mmHG, AVA (VTI) 0.85cm2, DI 0.24), mild to moderate aortic  regurgitation.   6. The inferior vena cava is normal in size with greater than 50%  respiratory  variability, suggesting right atrial pressure of 3 mmHg.   Comparison(s): A prior study was performed on 08/31/2021. Reported LVEF at  60-65%, mild MR, mild AS, normal diastolic function.    AV Area (  VTI):     0.81 cm  AV Peak Grad:      31.6 mmHg  AV Mean Grad:      18.4 mmHg  LVOT/AV VTI ratio: 0.24  AI PHT:            495 msec   EKG: Personally reviewed 03/14/2024 tracing - NSR, LBBB, no change   EKG Interpretation Date/Time:    Ventricular Rate:    PR Interval:    QRS Duration:    QT Interval:    QTC Calculation:   R Axis:      Text Interpretation:           Lipid Panel on therapy, currently stopped    Component Value Date/Time   CHOL 165 01/04/2014 0235   TRIG 86 01/04/2014 0235   HDL 79 01/04/2014 0235   CHOLHDL 2.1 01/04/2014 0235   VLDL 17 01/04/2014 0235   LDLCALC 69 01/04/2014 0235  02/25/2020 [place] 256, HDL 62, LDL 149, triglycerides 225 Hemoglobin 12.7, creatinine 0.8, TSH 1.63  03/03/2021 Cholesterol 153, HDL 63, LDL 61, triglycerides 144 Hemoglobin 13.6, potassium 4.4  11/01/2022 Cholesterol 155, HDL 65, LDL 50, triglycerides 200  05/01/2023 Cholesterol 170, HDL 63, LDL 79, triglycerides 142   BMET    Component Value Date/Time   NA 132 (L) 06/13/2015 0254   K 4.8 06/13/2015 0254   CL 100 (L) 06/13/2015 0254   CO2 23 06/13/2015 0254   GLUCOSE 143 (H) 06/13/2015 0254   BUN 14 06/13/2015 0254   CREATININE 0.85 06/13/2015 0254   CALCIUM  9.0 06/13/2015 0254   GFRNONAA >60 06/13/2015 0254   GFRAA >60 06/13/2015 0254     ASSESSMENT AND PLAN:  1. Pre-procedure lab exam   2. Nonrheumatic aortic valve stenosis        CAD: Does not have angina either at rest or with activity.  Remote history of non-STEMI due to diffusely diseased ramus intermedius, treated medically.  Asymptomatic with amlodipine  as the only antianginal agent.  On chronic clopidogrel , cannot take aspirin  due to her gastric bypass surgery related complaints. AS:  Appears to have paradoxical low-flow low gradient aortic stenosis that is at least moderate to severe.  She is symptomatic with exertional dyspnea NYHA functional class III.  I think is time to begin workup for aortic valve replacement.  Hopefully she will be a good candidate for TAVR.  Will start with right and left heart catheterization (scheduled for Dr. Verlin on July 11).  The risks and benefits of this procedure were discussed in detail with the patient and her husband. HLP: Did not tolerate multiple statins.  On PCSK9 inhibitors.  Due for repeat labs. HTN: No longer on amlodipine , blood pressure is well-controlled with enalapril  monotherapy.  Reduce need for blood pressure medications also probably signals that her aortic stenosis is now severe. Bilateral carotid bruits: Radiating from the aortic stenosis murmur, no evidence of carotid stenosis by duplex ultrasound in 2022. LBBB: No symptoms of high-grade AV block.  Although a right bundle branch block would have been even more predictive of need for pacemaker, this puts her at increased risk for possible pacemaker after TAVR.\  Informed Consent   Shared Decision Making/Informed Consent The risks [stroke (1 in 1000), death (1 in 1000), kidney failure [usually temporary] (1 in 500), bleeding (1 in 200), allergic reaction [possibly serious] (1 in 200)], benefits (diagnostic support and management of coronary artery disease) and alternatives of a cardiac catheterization were discussed in detail with Ms. Labuda and  she is willing to proceed.      Patient Instructions  Lab Work: CBC, BMP- today If you have labs (blood work) drawn today and your tests are completely normal, you will receive your results only by: MyChart Message (if you have MyChart) OR A paper copy in the mail If you have any lab test that is abnormal or we need to change your treatment, we will call you to review the results.  Testing/Procedures: Your physician has requested  that you have a cardiac catheterization. Cardiac catheterization is used to diagnose and/or treat various heart conditions. Doctors may recommend this procedure for a number of different reasons. The most common reason is to evaluate chest pain. Chest pain can be a symptom of coronary artery disease (CAD), and cardiac catheterization can show whether plaque is narrowing or blocking your heart's arteries. This procedure is also used to evaluate the valves, as well as measure the blood flow and oxygen levels in different parts of your heart. For further information please visit [email or web address] Please follow instruction sheet, as given.   Follow-Up: At [facility], you and your health needs are our priority.  As part of our continuing mission to provide you with exceptional heart care, our providers are all part of one team.  This team includes your primary Cardiologist (physician) and Advanced Practice Providers or APPs (Physician Assistants and Nurse Practitioners) who all work together to provide you with the care you need, when you need it.  Your next appointment:   6 month(s)  Provider:   Jerel Balding, MD    We recommend signing up for the patient portal called MyChart.  Sign up information is provided on this After Visit Summary.  MyChart is used to connect with patients for Virtual Visits (Telemedicine).  Patients are able to view lab/test results, encounter notes, upcoming appointments, etc.  Non-urgent messages can be sent to your provider as well.   To learn more about what you can do with MyChart, go to [email or web address].         Cardiac/Peripheral Catheterization   You are scheduled for a Cardiac Catheterization on Friday, July 11 with Dr. Lonni Cash.  1. Please arrive at the [facility] (Main Entrance A) at [facility]: [address] [place], [place] [address] at 5:30 AM (This time is two hour(s) before your procedure to ensure your  preparation).   Free valet parking service is available. You will check in at ADMITTING. The support person will be asked to wait in the waiting room.  It is OK to have someone drop you off and come back when you are ready to be discharged.        Special note: Every effort is made to have your procedure done on time. Please understand that emergencies sometimes delay scheduled procedures.  2. Diet: Do not eat solid foods after midnight.  You may have clear liquids until 5 AM the day of the procedure.  3. Labs: TODAY  4. Medication instructions in preparation for your procedure:   Contrast Allergy: No  DO NOT TAKE FUROSEMIDE /Lasix  the morning of procedure On the morning of your procedure, take Plavix /Clopidogrel  and any morning medicines NOT listed above.  You may use sips of water.  5. Plan to go home the same day, you will only stay overnight if medically necessary. 6. You MUST have a responsible adult to drive you home. 7. An adult MUST be with you the first 24 hours after you arrive home. 8.  Bring a current list of your medications, and the last time and date medication taken. 9. Bring ID and current insurance cards. 10.Please wear clothes that are easy to get on and off and wear slip-on shoes.  Thank you for allowing us  to care for you!   -- [facility]   Orders Placed This Encounter  Procedures   CBC   Basic metabolic panel with GFR   Ambulatory referral to [facility]ve [facility] (only at [facility])    No orders of the defined types were placed in this encounter.  Breezy Hertenstein  Constant Mandeville, MD, [facility] [phone] office [phone] pager

## 2024-06-18 NOTE — Patient Instructions (Signed)
 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 These factors include: normal aging, degenerative arthritis of your back, medication effects (tylenol  #3, meclizine, cymbalta , trazodone , robaxin , wellbutrin ), and the dizziness may tie in with your aortic stenosis. Please remember to stand up slowly and get your bearings first turn slowly, no bending down to pick anything, no heavy lifting, be extra careful at night and first thing in the morning. Also, be careful in the Bathroom and the kitchen. I recommend you start using a walker for gait safety. Remember to drink plenty of fluid, eat healthy meals and do not skip any meals. Try to eat protein with a every meal and eat a healthy snack such as fruit or nuts or yogurt in between meals. Try to keep a regular sleep-wake schedule and try to exercise daily, particularly in the form of walking, 20-30 minutes a day, if you can. Ask your primary to check your labs, including vitamin D , B12 and thyroid  function.  As far as your medications are concerned, I would like to suggest no new medications, in fact, I recommend you have your PCP critically review your meds and cut back as much as possible.  As far as diagnostic testing: I do not believe you need any additional test from my end at this time.  You can follow up with your primary care and other providers as scheduled/planned.

## 2024-06-18 NOTE — Progress Notes (Signed)
 Patient ID: Kimberly Stone, female   DOB: 11/28/1946, 78 y.o.   MRN: [id number]       06/18/2024  Reason for office visit AS, CAD, hyperlipidemia, statin intolerance   Mrs. Kimberly Stone is a 78 year old woman with a history of coronary disease (presented with non-STEMI in 2005 related to severe diffuse disease in ramus intermedius, not amenable to PCI), calcific aortic stenosis, LBBB, hypertension, hyperlipidemia, GERD, resolution of obesity and prediabetes after gastric bypass surgery.  Despite previous cholecystectomy she had common bile duct obstruction with pancreatitis in 2022.  She has recovered well from this.  She is experience further worsening of exertional dyspnea, this now occurs when making the bed.  Her husband even notices her lips turning a little blue.  She has to stop and rest a lot.  She has not had chest pain or syncope.  She does not have any dyspnea at rest, orthopnea, PND or lower extremity edema.  Treatment with furosemide  has not really helped.  Her most recent echocardiogram was interpreted as showing moderate aortic stenosis, but probably meets criteria for paradoxical low-flow low gradient severe aortic stenosis: She has normal LV systolic function, grade 2 diastolic dysfunction, mean aortic valve gradient 22 mmHg, peak velocity 3.1 m/s, dimensionless index 0.24).  She has very small body size (5 foot 2, 114 pounds) which makes her a typical candidate for paradoxical low-flow low gradient AS.  Left ventricular stroke-volume index was borderline for this diagnosis at 37 mL/min/m.  Metabolic control is fair with an LDL cholesterol of 79, HDL 63 and normal glucose levels.  She has a history of intolerance to multiple lipid-lowering agents due to either myopathy or liver test abnormalities (simvastatin, atorvastatin , rosuvastatin, pitavastatin, ezetimibe) and is managed on PCSK9 inhibitors.  In the past her LDL cholesterol was even better around 50.  She has bilateral carotid  bruits that appear to be radiating from the aortic stenosis murmur in her chest.  She had duplex ultrasound of the carotid arteries in October 2022 that did not show any significant stenoses and relatively minor plaque.   Allergies  Allergen Reactions   Aspirin  Other (See Comments)    PT CANNOT TAKE DUE TO HX OF GASTRIC BYPASS SURGERY    Loperamide Palpitations and Other (See Comments)   Statins     Other reaction(s): Other Joint pain   Atorvastatin      Other Reaction(s): elevated LFTs   Beta Adrenergic Blockers     Other Reaction(s): dyspnea   Celecoxib     Other Reaction(s): UGI bleed   Desvenlafaxine     Other Reaction(s): racing, twitching, everything going too fast.   Ezetimibe     Other Reaction(s): liver  panel went up   Loperamide Hcl     Other Reaction(s): heart palpitations   Nsaids     Other Reaction(s): prior GI bleed   Rosuvastatin     Other Reaction(s): elevated LFTs   Simvastatin     Other Reaction(s): elevated LF's, elevated LFTs, muscle problems   Erythromycin Other (See Comments)    UNKNOWN    Current Outpatient Medications  Medication Sig Dispense Refill   acetaminophen  (TYLENOL ) 650 MG CR tablet Take 650 mg by mouth as needed for pain.      acetaminophen -codeine (TYLENOL  #3) 300-30 MG tablet Take 1 tablet by mouth every 6 (six) hours as needed.     buPROPion  (WELLBUTRIN  XL) 300 MG 24 hr tablet Take 1 tablet (300 mg total) by mouth daily. 90 tablet 1  clopidogrel  (PLAVIX ) 75 MG tablet TAKE 1 TABLET (75 MG TOTAL) BY MOUTH DAILY. PLEASE SCHEDULE APPOINTMENT FOR REFILLS. 7 tablet 0   diphenoxylate-atropine (LOMOTIL) 2.5-0.025 MG tablet Take 1 tablet by mouth 4 (four) times daily as needed.     DULoxetine  (CYMBALTA ) 60 MG capsule Take 1 capsule (60 mg total) by mouth 2 (two) times daily. 180 capsule 1   enalapril  (VASOTEC ) 5 MG tablet Take 5 mg by mouth at bedtime.      Evolocumab (REPATHA) 140 MG/ML SOSY Inject 140 mg into the skin every 14 (fourteen) days.      famotidine (PEPCID) 20 MG tablet Take 20 mg by mouth as needed.     furosemide  (LASIX ) 20 MG tablet Take 1 tablet (20 mg total) by mouth daily. Be sure to eat potassium rich foods while taking Furosemide . 90 tablet 3   lipase/protease/amylase (CREON) 36000 UNITS CPEP capsule Take 2 capsules by mouth.     LUMIGAN 0.01 % SOLN Place 1 drop into both eyes at bedtime.     meclizine (ANTIVERT) 25 MG tablet Take 25 mg by mouth 3 (three) times daily as needed.     metoCLOPramide  (REGLAN ) 5 MG tablet Take 5 mg by mouth every 6 (six) hours as needed.      Multiple Vitamin (MULTIVITAMIN WITH MINERALS) TABS Take 2 tablets by mouth daily.     pantoprazole  (PROTONIX ) 40 MG tablet Take 40 mg by mouth daily with breakfast.     timolol (TIMOPTIC) 0.5 % ophthalmic solution Place 1 drop into both eyes 2 (two) times daily.     traZODone  (DESYREL ) 50 MG tablet Take 1.5 tablets (75 mg total) by mouth at bedtime. 135 tablet 1   Turmeric (QC TUMERIC COMPLEX PO) Take 1 capsule by mouth daily at 6 (six) AM.     ursodiol (ACTIGALL) 300 MG capsule Take 300 mg by mouth daily.     Vitamin D , Ergocalciferol , (DRISDOL ) 50000 UNITS CAPS Take 50,000 Units by mouth every Monday, Wednesday, and Friday.      amLODipine  (NORVASC ) 5 MG tablet Take 5 mg by mouth at bedtime.  (Patient not taking: Reported on 06/18/2024)     methocarbamol  (ROBAXIN ) 500 MG tablet Take 500 mg by mouth every 6 (six) hours. (Patient not taking: Reported on 06/18/2024)     PRESCRIPTION MEDICATION Inject 1 application into the muscle. INJECTION every 21-30 days     No current facility-administered medications for this visit.    Past Medical History:  Diagnosis Date   Acute MI (HCC) 11/2004   NONSTEMI 2005 NOINTERVENTION PER NOTE    Anemia    from bypass- iron and B12   Anxiety    Arthritis    Coronary artery disease    DDD (degenerative disc disease), cervical    Depression    DJD (degenerative joint disease)    Fibromyalgia    GERD  (gastroesophageal reflux disease)    GIB (gastrointestinal bleeding)    while on celecoxib post bypass   Hyperlipidemia    Hyperlipidemia LDL goal < 130 01/23/2014   Hypertension    Obesity    hx of bipass, now resolved.  borderline diabetes also resolved.   Osteopenia    Post-menopausal    SCCA (squamous cell carcinoma) of skin 09/21/2015   left forearm medial cx3,32fu   SCCA (squamous cell carcinoma) of skin 07/26/2016   bridge of nose cx3 65fu   SCCA (squamous cell carcinoma) of skin 02/10/2020   right inner cheek tx after biopsy  SCCA (squamous cell carcinoma) of skin 02/10/2020   left hand tx after biopsy   Scoliosis    Sinusitis    Statin intolerance 01/23/2014    Past Surgical History:  Procedure Laterality Date   CARDIAC CATHETERIZATION     2005 Severely disease small optional diagonal vessel responsible far wall motion abnormalities wise to small and diffusely diseased for consideration of interventio.   CATARACT EXTRACTION     COLONOSCOPY     GASTRIC BYPASS  01/2004   KNEE ARTHROSCOPY Right    LAPAROSCOPIC CHOLECYSTECTOMY  1993   TOTAL VAGINAL HYSTERECTOMY  1973   secondary to DUB - Dr. Elsa and Dr. Claudene    Family History  Problem Relation Age of Onset   Heart failure Mother    Liver cancer Father    Cancer Brother    Heart failure Maternal Grandmother    Aneurysm Maternal Grandmother        ruptured abdominal aortic aneurysm    Review of systems: Please see history of present illness.  Denies other complaints.   PHYSICAL EXAM BP 126/76 (BP Location: Left Arm, Patient Position: Sitting, Cuff Size: Normal)   Pulse 75   Ht 5' 2 (1.575 m)   Wt 110 lb (49.9 kg)   LMP 03/18/1972 (Approximate)   SpO2 98%   BMI 20.12 kg/m     General: Alert, oriented x3, no distress, appears very lean and fit Head: no evidence of trauma, PERRL, EOMI, no exophtalmos or lid lag, no myxedema, no xanthelasma; normal ears, nose and oropharynx Neck: normal jugular venous  pulsations and no hepatojugular reflux; brisk carotid pulses without delay and no carotid bruits Chest: clear to auscultation, no signs of consolidation by percussion or palpation, normal fremitus, symmetrical and full respiratory excursions Cardiovascular: normal position and quality of the apical impulse, regular rhythm, normal first and diminished second heart sounds, late peaking 3/6 aortic ejection murmur, no diastolic murmurs, rubs or gallops Abdomen: no tenderness or distention, no masses by palpation, no abnormal pulsatility or arterial bruits, normal bowel sounds, no hepatosplenomegaly Extremities: no clubbing, cyanosis or edema; 2+ radial, ulnar and brachial pulses bilaterally; 2+ right femoral, posterior tibial and dorsalis pedis pulses; 2+ left femoral, posterior tibial and dorsalis pedis pulses; no subclavian or femoral bruits Neurological: grossly nonfocal Psych: Normal mood and affect     Echocardiogram 04/17/2024:  1. Left ventricular ejection fraction, by estimation, is 55 to 60%. The  left ventricle has normal function. The left ventricle has no regional  wall motion abnormalities. Left ventricular diastolic parameters are  consistent with Grade II diastolic  dysfunction (pseudonormalization). Elevated left atrial pressure. The E/e'  is 16.   2. Right ventricular systolic function is normal. The right ventricular  size is normal. Tricuspid regurgitation signal is inadequate for assessing  PA pressure.   3. Left atrial size was moderately dilated.   4. The mitral valve is degenerative. Mild mitral valve regurgitation. No  evidence of mitral stenosis.   5. Native aortic valve, trileaflet, calcified leaflets and cups, reduced  excursion, moderate to severe aortic stenosis (peak velocity at RSB  3.102m/s, MG 22 mmHG, AVA (VTI) 0.85cm2, DI 0.24), mild to moderate aortic  regurgitation.   6. The inferior vena cava is normal in size with greater than 50%  respiratory  variability, suggesting right atrial pressure of 3 mmHg.   Comparison(s): A prior study was performed on 08/31/2021. Reported LVEF at  60-65%, mild MR, mild AS, normal diastolic function.    AV Area (  VTI):     0.81 cm  AV Peak Grad:      31.6 mmHg  AV Mean Grad:      18.4 mmHg  LVOT/AV VTI ratio: 0.24  AI PHT:            495 msec   EKG: Personally reviewed 03/14/2024 tracing - NSR, LBBB, no change   EKG Interpretation Date/Time:    Ventricular Rate:    PR Interval:    QRS Duration:    QT Interval:    QTC Calculation:   R Axis:      Text Interpretation:           Lipid Panel on therapy, currently stopped    Component Value Date/Time   CHOL 165 01/04/2014 0235   TRIG 86 01/04/2014 0235   HDL 79 01/04/2014 0235   CHOLHDL 2.1 01/04/2014 0235   VLDL 17 01/04/2014 0235   LDLCALC 69 01/04/2014 0235  02/25/2020 [place] 256, HDL 62, LDL 149, triglycerides 225 Hemoglobin 12.7, creatinine 0.8, TSH 1.63  03/03/2021 Cholesterol 153, HDL 63, LDL 61, triglycerides 144 Hemoglobin 13.6, potassium 4.4  11/01/2022 Cholesterol 155, HDL 65, LDL 50, triglycerides 200  05/01/2023 Cholesterol 170, HDL 63, LDL 79, triglycerides 142   BMET    Component Value Date/Time   NA 132 (L) 06/13/2015 0254   K 4.8 06/13/2015 0254   CL 100 (L) 06/13/2015 0254   CO2 23 06/13/2015 0254   GLUCOSE 143 (H) 06/13/2015 0254   BUN 14 06/13/2015 0254   CREATININE 0.85 06/13/2015 0254   CALCIUM  9.0 06/13/2015 0254   GFRNONAA >60 06/13/2015 0254   GFRAA >60 06/13/2015 0254     ASSESSMENT AND PLAN:  1. Pre-procedure lab exam   2. Nonrheumatic aortic valve stenosis        CAD: Does not have angina either at rest or with activity.  Remote history of non-STEMI due to diffusely diseased ramus intermedius, treated medically.  Asymptomatic with amlodipine  as the only antianginal agent.  On chronic clopidogrel , cannot take aspirin  due to her gastric bypass surgery related complaints. AS:  Appears to have paradoxical low-flow low gradient aortic stenosis that is at least moderate to severe.  She is symptomatic with exertional dyspnea NYHA functional class III.  I think is time to begin workup for aortic valve replacement.  Hopefully she will be a good candidate for TAVR.  Will start with right and left heart catheterization (scheduled for Dr. Verlin on July 11).  The risks and benefits of this procedure were discussed in detail with the patient and her husband. HLP: Did not tolerate multiple statins.  On PCSK9 inhibitors.  Due for repeat labs. HTN: No longer on amlodipine , blood pressure is well-controlled with enalapril  monotherapy.  Reduce need for blood pressure medications also probably signals that her aortic stenosis is now severe. Bilateral carotid bruits: Radiating from the aortic stenosis murmur, no evidence of carotid stenosis by duplex ultrasound in 2022. LBBB: No symptoms of high-grade AV block.  Although a right bundle branch block would have been even more predictive of need for pacemaker, this puts her at increased risk for possible pacemaker after TAVR.\  Informed Consent   Shared Decision Making/Informed Consent The risks [stroke (1 in 1000), death (1 in 1000), kidney failure [usually temporary] (1 in 500), bleeding (1 in 200), allergic reaction [possibly serious] (1 in 200)], benefits (diagnostic support and management of coronary artery disease) and alternatives of a cardiac catheterization were discussed in detail with Ms. Labuda and  she is willing to proceed.      Patient Instructions  Lab Work: CBC, BMP- today If you have labs (blood work) drawn today and your tests are completely normal, you will receive your results only by: MyChart Message (if you have MyChart) OR A paper copy in the mail If you have any lab test that is abnormal or we need to change your treatment, we will call you to review the results.  Testing/Procedures: Your physician has requested  that you have a cardiac catheterization. Cardiac catheterization is used to diagnose and/or treat various heart conditions. Doctors may recommend this procedure for a number of different reasons. The most common reason is to evaluate chest pain. Chest pain can be a symptom of coronary artery disease (CAD), and cardiac catheterization can show whether plaque is narrowing or blocking your heart's arteries. This procedure is also used to evaluate the valves, as well as measure the blood flow and oxygen levels in different parts of your heart. For further information please visit [email or web address] Please follow instruction sheet, as given.   Follow-Up: At [facility], you and your health needs are our priority.  As part of our continuing mission to provide you with exceptional heart care, our providers are all part of one team.  This team includes your primary Cardiologist (physician) and Advanced Practice Providers or APPs (Physician Assistants and Nurse Practitioners) who all work together to provide you with the care you need, when you need it.  Your next appointment:   6 month(s)  Provider:   Jerel Balding, MD    We recommend signing up for the patient portal called MyChart.  Sign up information is provided on this After Visit Summary.  MyChart is used to connect with patients for Virtual Visits (Telemedicine).  Patients are able to view lab/test results, encounter notes, upcoming appointments, etc.  Non-urgent messages can be sent to your provider as well.   To learn more about what you can do with MyChart, go to [email or web address].         Cardiac/Peripheral Catheterization   You are scheduled for a Cardiac Catheterization on Friday, July 11 with Dr. Lonni Cash.  1. Please arrive at the [facility] (Main Entrance A) at [facility]: [address] [place], [place] [address] at 5:30 AM (This time is two hour(s) before your procedure to ensure your  preparation).   Free valet parking service is available. You will check in at ADMITTING. The support person will be asked to wait in the waiting room.  It is OK to have someone drop you off and come back when you are ready to be discharged.        Special note: Every effort is made to have your procedure done on time. Please understand that emergencies sometimes delay scheduled procedures.  2. Diet: Do not eat solid foods after midnight.  You may have clear liquids until 5 AM the day of the procedure.  3. Labs: TODAY  4. Medication instructions in preparation for your procedure:   Contrast Allergy: No  DO NOT TAKE FUROSEMIDE /Lasix  the morning of procedure On the morning of your procedure, take Plavix /Clopidogrel  and any morning medicines NOT listed above.  You may use sips of water.  5. Plan to go home the same day, you will only stay overnight if medically necessary. 6. You MUST have a responsible adult to drive you home. 7. An adult MUST be with you the first 24 hours after you arrive home. 8.  Bring a current list of your medications, and the last time and date medication taken. 9. Bring ID and current insurance cards. 10.Please wear clothes that are easy to get on and off and wear slip-on shoes.  Thank you for allowing us  to care for you!   -- [facility]   Orders Placed This Encounter  Procedures   CBC   Basic metabolic panel with GFR   Ambulatory referral to Structural Heart/Valve Clinic (only at [facility])    No orders of the defined types were placed in this encounter.  Breezy Hertenstein  Constant Mandeville, MD, [facility] [phone] office [phone] pager

## 2024-06-19 ENCOUNTER — Ambulatory Visit: Payer: Self-pay | Admitting: Cardiovascular Disease

## 2024-06-19 LAB — CBC
Hematocrit: 36.9 (ref 34.0–46.6)
Hemoglobin: 12 g/dL (ref 11.1–15.9)
MCH: 31.3 pg (ref 26.6–33.0)
MCHC: 32.5 g/dL (ref 31.5–35.7)
MCV: 96 fL (ref 79–97)
Platelets: 411 10*3/uL (ref 150–450)
RBC: 3.84 x10E6/uL (ref 3.77–5.28)
RDW: 12.3 (ref 11.7–15.4)
WBC: 6.2 10*3/uL (ref 3.4–10.8)

## 2024-06-19 LAB — BASIC METABOLIC PANEL WITH GFR
BUN/Creatinine Ratio: 22 (ref 12–28)
BUN: 17 mg/dL (ref 8–27)
CO2: 24 mmol/L (ref 20–29)
Calcium: 9.1 mg/dL (ref 8.7–10.3)
Chloride: 103 mmol/L (ref 96–106)
Creatinine, Ser: 0.77 mg/dL (ref 0.57–1.00)
Glucose: 84 mg/dL (ref 70–99)
Potassium: 4.2 mmol/L (ref 3.5–5.2)
Sodium: 142 mmol/L (ref 134–144)
eGFR: 79 mL/min/{1.73_m2} (ref >59)

## 2024-06-26 ENCOUNTER — Telehealth: Payer: Self-pay | Admitting: *Deleted

## 2024-06-26 DIAGNOSIS — R198 Other specified symptoms and signs involving the digestive system and abdomen: Secondary | ICD-10-CM | POA: Diagnosis not present

## 2024-06-26 DIAGNOSIS — Z9884 Bariatric surgery status: Secondary | ICD-10-CM | POA: Diagnosis not present

## 2024-06-26 DIAGNOSIS — R112 Nausea with vomiting, unspecified: Secondary | ICD-10-CM | POA: Diagnosis not present

## 2024-06-26 DIAGNOSIS — K219 Gastro-esophageal reflux disease without esophagitis: Secondary | ICD-10-CM | POA: Diagnosis not present

## 2024-06-26 DIAGNOSIS — R634 Abnormal weight loss: Secondary | ICD-10-CM | POA: Diagnosis not present

## 2024-06-26 DIAGNOSIS — K8689 Other specified diseases of pancreas: Secondary | ICD-10-CM | POA: Diagnosis not present

## 2024-06-26 NOTE — Telephone Encounter (Addendum)
 Cardiac Catheterization scheduled at [facility] for: Friday June 27, 2024 7:30 AM Arrival time [facility] Main Entrance A at: 5:30 AM  Nothing to eat after midnight prior to procedure, clear liquids until 5 AM day of procedure.  Medication instructions: -Hold:  Lasix -AM of procedure  -Other usual morning medications can be taken with sips of water including Plavix  75 mg.  No aspirin   Plan to go home the same day, you will only stay overnight if medically necessary.  You must have responsible adult to drive you home.  Someone must be with you the first 24 hours after you arrive home.  Reviewed procedure instructions with patient.

## 2024-06-27 ENCOUNTER — Encounter (HOSPITAL_COMMUNITY)
Admission: RE | Disposition: A | Discharge status: Home / Self Care | Payer: Self-pay | Source: Home / Self Care | Attending: Cardiovascular Disease

## 2024-06-27 ENCOUNTER — Other Ambulatory Visit: Payer: Self-pay

## 2024-06-27 ENCOUNTER — Ambulatory Visit (HOSPITAL_COMMUNITY)
Admission: RE | Admit: 2024-06-27 | Discharge: 2024-06-27 | Disposition: A | Discharge status: Home / Self Care | Attending: Cardiovascular Disease | Admitting: Cardiovascular Disease

## 2024-06-27 DIAGNOSIS — Z9884 Bariatric surgery status: Secondary | ICD-10-CM | POA: Insufficient documentation

## 2024-06-27 DIAGNOSIS — E785 Hyperlipidemia, unspecified: Secondary | ICD-10-CM | POA: Insufficient documentation

## 2024-06-27 DIAGNOSIS — Z7902 Long term (current) use of antithrombotics/antiplatelets: Secondary | ICD-10-CM | POA: Insufficient documentation

## 2024-06-27 DIAGNOSIS — I10 Essential (primary) hypertension: Secondary | ICD-10-CM | POA: Insufficient documentation

## 2024-06-27 DIAGNOSIS — R7303 Prediabetes: Secondary | ICD-10-CM | POA: Insufficient documentation

## 2024-06-27 DIAGNOSIS — I340 Nonrheumatic mitral (valve) insufficiency: Secondary | ICD-10-CM | POA: Diagnosis not present

## 2024-06-27 DIAGNOSIS — I252 Old myocardial infarction: Secondary | ICD-10-CM | POA: Insufficient documentation

## 2024-06-27 DIAGNOSIS — I350 Nonrheumatic aortic (valve) stenosis: Secondary | ICD-10-CM | POA: Insufficient documentation

## 2024-06-27 DIAGNOSIS — Z79899 Other long term (current) drug therapy: Secondary | ICD-10-CM | POA: Insufficient documentation

## 2024-06-27 DIAGNOSIS — I2510 Atherosclerotic heart disease of native coronary artery without angina pectoris: Secondary | ICD-10-CM | POA: Insufficient documentation

## 2024-06-27 DIAGNOSIS — K219 Gastro-esophageal reflux disease without esophagitis: Secondary | ICD-10-CM | POA: Insufficient documentation

## 2024-06-27 DIAGNOSIS — I452 Bifascicular block: Secondary | ICD-10-CM | POA: Insufficient documentation

## 2024-06-27 HISTORY — PX: RIGHT HEART CATH AND CORONARY ANGIOGRAPHY: CATH118264

## 2024-06-27 LAB — POCT I-STAT EG7
Acid-base deficit: 1 mmol/L (ref 0.0–2.0)
Bicarbonate: 24.1 mmol/L (ref 20.0–28.0)
Calcium, Ion: 0.89 mmol/L — CL (ref 1.15–1.40)
HCT: 28 % — ABNORMAL LOW (ref 36.0–46.0)
Hemoglobin: 9.5 g/dL — ABNORMAL LOW (ref 12.0–15.0)
O2 Saturation: 73 %
Potassium: 2.9 mmol/L — ABNORMAL LOW (ref 3.5–5.1)
Sodium: 141 mmol/L (ref 135–145)
TCO2: 25 mmol/L (ref 22–32)
pCO2, Ven: 43.4 mmHg — ABNORMAL LOW (ref 44–60)
pH, Ven: 7.353 (ref 7.25–7.43)
pO2, Ven: 41 mmHg (ref 32–45)

## 2024-06-27 LAB — POCT I-STAT 7, (LYTES, BLD GAS, ICA,H+H)
Acid-Base Excess: 1 mmol/L (ref 0.0–2.0)
Bicarbonate: 25.3 mmol/L (ref 20.0–28.0)
Calcium, Ion: 1.01 mmol/L — ABNORMAL LOW (ref 1.15–1.40)
HCT: 29 % — ABNORMAL LOW (ref 36.0–46.0)
Hemoglobin: 9.9 g/dL — ABNORMAL LOW (ref 12.0–15.0)
O2 Saturation: 97 %
Potassium: 3.3 mmol/L — ABNORMAL LOW (ref 3.5–5.1)
Sodium: 144 mmol/L (ref 135–145)
TCO2: 27 mmol/L (ref 22–32)
pCO2 arterial: 39.7 mmHg (ref 32–48)
pH, Arterial: 7.412 (ref 7.35–7.45)
pO2, Arterial: 88 mmHg (ref 83–108)

## 2024-06-27 SURGERY — RIGHT HEART CATH AND CORONARY ANGIOGRAPHY
Anesthesia: LOCAL

## 2024-06-27 MED ORDER — MIDAZOLAM HCL 2 MG/2ML IJ SOLN
INTRAMUSCULAR | Status: DC | PRN
Start: 2024-06-27 — End: 2024-06-27
  Administered 2024-06-27: 1 mg via INTRAVENOUS

## 2024-06-27 MED ORDER — SODIUM CHLORIDE 0.9 % IV SOLN: INTRAVENOUS | Status: DC

## 2024-06-27 MED ORDER — FENTANYL CITRATE (PF) 100 MCG/2ML IJ SOLN
INTRAMUSCULAR | Status: AC
  Filled 2024-06-27: qty 2

## 2024-06-27 MED ORDER — MIDAZOLAM HCL 2 MG/2ML IJ SOLN
INTRAMUSCULAR | Status: AC
  Filled 2024-06-27: qty 2

## 2024-06-27 MED ORDER — SODIUM CHLORIDE 0.9% FLUSH: 3.0000 mL | INTRAVENOUS | Status: DC | PRN

## 2024-06-27 MED ORDER — VERAPAMIL HCL 2.5 MG/ML IV SOLN
INTRAVENOUS | Status: DC | PRN
  Administered 2024-06-27: 10 mL via INTRA_ARTERIAL

## 2024-06-27 MED ORDER — HYDRALAZINE HCL 20 MG/ML IJ SOLN: 10.0000 mg | INTRAMUSCULAR | Status: DC | PRN

## 2024-06-27 MED ORDER — HEPARIN SODIUM (PORCINE) 1000 UNIT/ML IJ SOLN
INTRAMUSCULAR | Status: DC | PRN
  Administered 2024-06-27: 3000 [IU] via INTRAVENOUS

## 2024-06-27 MED ORDER — HEPARIN (PORCINE) IN NACL 1000-0.9 UT/500ML-% IV SOLN
INTRAVENOUS | Status: DC | PRN
Start: 2024-06-27 — End: 2024-06-27
  Administered 2024-06-27: 1000 mL

## 2024-06-27 MED ORDER — LIDOCAINE HCL (PF) 1 % IJ SOLN
INTRAMUSCULAR | Status: DC | PRN
  Administered 2024-06-27 (×2): 2 mL

## 2024-06-27 MED ORDER — LIDOCAINE HCL (PF) 1 % IJ SOLN
INTRAMUSCULAR | Status: AC
  Filled 2024-06-27: qty 30

## 2024-06-27 MED ORDER — ACETAMINOPHEN 325 MG PO TABS: 650.0000 mg | ORAL_TABLET | ORAL | Status: DC | PRN

## 2024-06-27 MED ORDER — SODIUM CHLORIDE 0.9% FLUSH: 3.0000 mL | Freq: Two times a day (BID) | INTRAVENOUS | Status: DC

## 2024-06-27 MED ORDER — CLOPIDOGREL BISULFATE 75 MG PO TABS: 75.0000 mg | ORAL_TABLET | ORAL | Status: AC

## 2024-06-27 MED ORDER — HEPARIN SODIUM (PORCINE) 1000 UNIT/ML IJ SOLN
INTRAMUSCULAR | Status: AC
  Filled 2024-06-27: qty 10

## 2024-06-27 MED ORDER — SODIUM CHLORIDE 0.9 % WEIGHT BASED INFUSION: 3.0000 mL/kg/h | INTRAVENOUS | Status: AC

## 2024-06-27 MED ORDER — SODIUM CHLORIDE 0.9 % IV SOLN: 250.0000 mL | INTRAVENOUS | Status: DC | PRN

## 2024-06-27 MED ORDER — IOHEXOL 350 MG/ML SOLN
INTRAVENOUS | Status: DC | PRN
  Administered 2024-06-27: 35 mL

## 2024-06-27 MED ORDER — ONDANSETRON HCL 4 MG/2ML IJ SOLN: 4.0000 mg | Freq: Four times a day (QID) | INTRAMUSCULAR | Status: DC | PRN

## 2024-06-27 MED ORDER — SODIUM CHLORIDE 0.9 % WEIGHT BASED INFUSION: 1.0000 mL/kg/h | INTRAVENOUS | Status: DC

## 2024-06-27 MED ORDER — FENTANYL CITRATE (PF) 100 MCG/2ML IJ SOLN
INTRAMUSCULAR | Status: DC | PRN
  Administered 2024-06-27: 25 ug via INTRAVENOUS

## 2024-06-27 SURGICAL SUPPLY — 11 items
CATH 5FR JL3.5 JR4 ANG PIG MP (CATHETERS) IMPLANT
CATH BALLN WEDGE 5F 110CM (CATHETERS) IMPLANT
DEVICE RAD TR BAND REGULAR (VASCULAR PRODUCTS) IMPLANT
GLIDESHEATH SLEND SS 6F .021 (SHEATH) IMPLANT
GUIDEWIRE .025 260CM (WIRE) IMPLANT
GUIDEWIRE INQWIRE 1.5J.035X260 (WIRE) IMPLANT
KIT SYRINGE INJ CVI SPIKEX1 (MISCELLANEOUS) IMPLANT
PACK CARDIAC CATHETERIZATION (CUSTOM PROCEDURE TRAY) ×1 IMPLANT
SET ATX-X65L (MISCELLANEOUS) IMPLANT
SHEATH GLIDE SLENDER 4/5FR (SHEATH) IMPLANT
SHEATH PROBE COVER 6X72 (BAG) IMPLANT

## 2024-06-27 NOTE — Progress Notes (Signed)
 Patient and husband was given discharge instructions. Both verbalized understanding.

## 2024-06-27 NOTE — Interval H&P Note (Signed)
 History and Physical Interval Note:  06/27/2024 7:30 AM  Kimberly Stone  has presented today for surgery, with the diagnosis of aortic stenosis.  The various methods of treatment have been discussed with the patient and family. After consideration of risks, benefits and other options for treatment, the patient has consented to  Procedure(s): RIGHT/LEFT HEART CATH AND CORONARY ANGIOGRAPHY (N/A) as a surgical intervention.  The patient's history has been reviewed, patient examined, no change in status, stable for surgery.  I have reviewed the patient's chart and labs.  Questions were answered to the patient's satisfaction.    Cath Lab Visit (complete for each Cath Lab visit)  Clinical Evaluation Leading to the Procedure:   ACS: No.  Non-ACS:    Anginal Classification: CCS II  Anti-ischemic medical therapy: No Therapy  Non-Invasive Test Results: No non-invasive testing performed  Prior CABG: No previous CABG        Lonni Cash

## 2024-06-27 NOTE — Discharge Instructions (Signed)

## 2024-06-28 ENCOUNTER — Encounter (HOSPITAL_COMMUNITY): Payer: Self-pay | Admitting: Cardiovascular Disease

## 2024-07-02 ENCOUNTER — Other Ambulatory Visit: Payer: Self-pay

## 2024-07-02 DIAGNOSIS — I350 Nonrheumatic aortic (valve) stenosis: Secondary | ICD-10-CM

## 2024-07-02 DIAGNOSIS — R634 Abnormal weight loss: Secondary | ICD-10-CM

## 2024-07-04 ENCOUNTER — Other Ambulatory Visit: Payer: Self-pay

## 2024-07-04 DIAGNOSIS — I350 Nonrheumatic aortic (valve) stenosis: Secondary | ICD-10-CM

## 2024-07-21 DIAGNOSIS — M797 Fibromyalgia: Secondary | ICD-10-CM | POA: Diagnosis not present

## 2024-07-21 DIAGNOSIS — M9903 Segmental and somatic dysfunction of lumbar region: Secondary | ICD-10-CM | POA: Diagnosis not present

## 2024-07-21 DIAGNOSIS — M9905 Segmental and somatic dysfunction of pelvic region: Secondary | ICD-10-CM | POA: Diagnosis not present

## 2024-07-21 DIAGNOSIS — M9904 Segmental and somatic dysfunction of sacral region: Secondary | ICD-10-CM | POA: Diagnosis not present

## 2024-07-21 DIAGNOSIS — M47812 Spondylosis without myelopathy or radiculopathy, cervical region: Secondary | ICD-10-CM | POA: Diagnosis not present

## 2024-07-21 DIAGNOSIS — M4726 Other spondylosis with radiculopathy, lumbar region: Secondary | ICD-10-CM | POA: Diagnosis not present

## 2024-07-21 DIAGNOSIS — M9901 Segmental and somatic dysfunction of cervical region: Secondary | ICD-10-CM | POA: Diagnosis not present

## 2024-07-21 DIAGNOSIS — M9902 Segmental and somatic dysfunction of thoracic region: Secondary | ICD-10-CM | POA: Diagnosis not present

## 2024-07-21 DIAGNOSIS — M4125 Other idiopathic scoliosis, thoracolumbar region: Secondary | ICD-10-CM | POA: Diagnosis not present

## 2024-07-24 DIAGNOSIS — M47812 Spondylosis without myelopathy or radiculopathy, cervical region: Secondary | ICD-10-CM | POA: Diagnosis not present

## 2024-07-24 DIAGNOSIS — M9902 Segmental and somatic dysfunction of thoracic region: Secondary | ICD-10-CM | POA: Diagnosis not present

## 2024-07-24 DIAGNOSIS — M9905 Segmental and somatic dysfunction of pelvic region: Secondary | ICD-10-CM | POA: Diagnosis not present

## 2024-07-24 DIAGNOSIS — M9904 Segmental and somatic dysfunction of sacral region: Secondary | ICD-10-CM | POA: Diagnosis not present

## 2024-07-24 DIAGNOSIS — M797 Fibromyalgia: Secondary | ICD-10-CM | POA: Diagnosis not present

## 2024-07-24 DIAGNOSIS — M4125 Other idiopathic scoliosis, thoracolumbar region: Secondary | ICD-10-CM | POA: Diagnosis not present

## 2024-07-24 DIAGNOSIS — M9903 Segmental and somatic dysfunction of lumbar region: Secondary | ICD-10-CM | POA: Diagnosis not present

## 2024-07-24 DIAGNOSIS — M9901 Segmental and somatic dysfunction of cervical region: Secondary | ICD-10-CM | POA: Diagnosis not present

## 2024-07-24 DIAGNOSIS — M4726 Other spondylosis with radiculopathy, lumbar region: Secondary | ICD-10-CM | POA: Diagnosis not present

## 2024-07-28 DIAGNOSIS — M4125 Other idiopathic scoliosis, thoracolumbar region: Secondary | ICD-10-CM | POA: Diagnosis not present

## 2024-07-28 DIAGNOSIS — M9905 Segmental and somatic dysfunction of pelvic region: Secondary | ICD-10-CM | POA: Diagnosis not present

## 2024-07-28 DIAGNOSIS — M9901 Segmental and somatic dysfunction of cervical region: Secondary | ICD-10-CM | POA: Diagnosis not present

## 2024-07-28 DIAGNOSIS — M47812 Spondylosis without myelopathy or radiculopathy, cervical region: Secondary | ICD-10-CM | POA: Diagnosis not present

## 2024-07-28 DIAGNOSIS — M797 Fibromyalgia: Secondary | ICD-10-CM | POA: Diagnosis not present

## 2024-07-28 DIAGNOSIS — M9904 Segmental and somatic dysfunction of sacral region: Secondary | ICD-10-CM | POA: Diagnosis not present

## 2024-07-28 DIAGNOSIS — M9902 Segmental and somatic dysfunction of thoracic region: Secondary | ICD-10-CM | POA: Diagnosis not present

## 2024-07-28 DIAGNOSIS — M9903 Segmental and somatic dysfunction of lumbar region: Secondary | ICD-10-CM | POA: Diagnosis not present

## 2024-07-28 DIAGNOSIS — M4726 Other spondylosis with radiculopathy, lumbar region: Secondary | ICD-10-CM | POA: Diagnosis not present

## 2024-07-31 DIAGNOSIS — M9901 Segmental and somatic dysfunction of cervical region: Secondary | ICD-10-CM | POA: Diagnosis not present

## 2024-07-31 DIAGNOSIS — M9904 Segmental and somatic dysfunction of sacral region: Secondary | ICD-10-CM | POA: Diagnosis not present

## 2024-07-31 DIAGNOSIS — M9903 Segmental and somatic dysfunction of lumbar region: Secondary | ICD-10-CM | POA: Diagnosis not present

## 2024-07-31 DIAGNOSIS — M4726 Other spondylosis with radiculopathy, lumbar region: Secondary | ICD-10-CM | POA: Diagnosis not present

## 2024-07-31 DIAGNOSIS — M9902 Segmental and somatic dysfunction of thoracic region: Secondary | ICD-10-CM | POA: Diagnosis not present

## 2024-07-31 DIAGNOSIS — M9905 Segmental and somatic dysfunction of pelvic region: Secondary | ICD-10-CM | POA: Diagnosis not present

## 2024-07-31 DIAGNOSIS — M4125 Other idiopathic scoliosis, thoracolumbar region: Secondary | ICD-10-CM | POA: Diagnosis not present

## 2024-07-31 DIAGNOSIS — M797 Fibromyalgia: Secondary | ICD-10-CM | POA: Diagnosis not present

## 2024-07-31 DIAGNOSIS — M47812 Spondylosis without myelopathy or radiculopathy, cervical region: Secondary | ICD-10-CM | POA: Diagnosis not present

## 2024-08-01 ENCOUNTER — Encounter: Payer: Self-pay | Admitting: Adult Health

## 2024-08-01 ENCOUNTER — Telehealth: Admitting: Adult Health

## 2024-08-01 DIAGNOSIS — F331 Major depressive disorder, recurrent, moderate: Secondary | ICD-10-CM | POA: Diagnosis not present

## 2024-08-01 DIAGNOSIS — F411 Generalized anxiety disorder: Secondary | ICD-10-CM

## 2024-08-01 DIAGNOSIS — G4700 Insomnia, unspecified: Secondary | ICD-10-CM

## 2024-08-01 NOTE — Progress Notes (Signed)
 Kimberly Stone [id number] 1946/05/29 78 y.o.  Virtual Visit via Video Note  I connected with pt @ on 08/01/24 at 11:00 AM EDT by a video enabled telemedicine application and verified that I am speaking with the correct person using two identifiers.   I discussed the limitations of evaluation and management by telemedicine and the availability of in person appointments. The patient expressed understanding and agreed to proceed.  I discussed the assessment and treatment plan with the patient. The patient was provided an opportunity to ask questions and all were answered. The patient agreed with the plan and demonstrated an understanding of the instructions.   The patient was advised to call back or seek an in-person evaluation if the symptoms worsen or if the condition fails to improve as anticipated.  I provided 15 minutes of non-face-to-face time during this encounter.  The patient was located at home.  The provider was located at [facility].   Angeline LOISE Sayers, NP   Subjective:   Patient ID:  Kimberly Stone is a 78 y.o. (DOB [birth year]) female.  Chief Complaint: No chief complaint on file.   HPI MEHR DEPAOLI presents for follow-up of MDD, GAD and insomnia.    Describes mood today as ok. Pleasant. Denies tearfulness. Mood symptoms - denies depression, anxiety and irritability. Reports stable interest and motivation. Denies panic attacks. Reports some worry. Denies rumination and over thinking. Denies obsessive thoughts and acts. Reports upcoming cardiac and GI surgeries. Reports mood as stable. Stating I feel like I'm doing great. Feels like current medications are helpful. Taking medications as prescribed.  Energy levels lower - gets tired easily. Active, does not have a regular exercise routine. Enjoys some usual interests and activities. Married. Lives with husband. Has 2 sons - 72 and 63 - lost her daughter at age 9. Has 5 grandchildren. Spending time with  family.  Appetite adequate. Weight loss - 102 pounds. Working with [place] - appointment upcoming. Reports sleeping better some nights than others. Averages 7 to 8 hours.  Reports focus and concentration difficulties. Completing tasks. Managing aspects of household. Retired.  Denies SI or HI.  Denies AH or VH. Denies self harm. Denies substance use.    Review of Systems:  Review of Systems  Musculoskeletal:  Negative for gait problem.  Neurological:  Negative for tremors.  Psychiatric/Behavioral:         Please refer to HPI    Medications: I have reviewed the patient's current medications.  Current Outpatient Medications  Medication Sig Dispense Refill   acetaminophen  (TYLENOL ) 650 MG CR tablet Take 1,300 mg by mouth 2 (two) times daily as needed for pain.     amLODipine  (NORVASC ) 5 MG tablet Take 5 mg by mouth daily.     buPROPion  (WELLBUTRIN  XL) 300 MG 24 hr tablet Take 1 tablet (300 mg total) by mouth daily. 90 tablet 1   Cholecalciferol (VITAMIN D3 ULTRA STRENGTH) 125 MCG (5000 UT) capsule Take 5,000 Units by mouth daily.     clopidogrel  (PLAVIX ) 75 MG tablet TAKE 1 TABLET (75 MG TOTAL) BY MOUTH DAILY. PLEASE SCHEDULE APPOINTMENT FOR REFILLS. 7 tablet 0   diphenoxylate-atropine (LOMOTIL) 2.5-0.025 MG tablet Take 1 tablet by mouth 4 (four) times daily as needed.     DULoxetine  (CYMBALTA ) 60 MG capsule Take 1 capsule (60 mg total) by mouth 2 (two) times daily. (Patient taking differently: Take 60 mg by mouth daily.) 180 capsule 1   Evolocumab (REPATHA) 140 MG/ML SOSY Inject 140 mg into  the skin every 21 ( twenty-one) days.     furosemide  (LASIX ) 20 MG tablet Take 1 tablet (20 mg total) by mouth daily. Be sure to eat potassium rich foods while taking Furosemide . 90 tablet 3   meclizine (ANTIVERT) 25 MG tablet Take 25 mg by mouth 3 (three) times daily as needed.     methocarbamol  (ROBAXIN ) 500 MG tablet Take 500 mg by mouth every 8 (eight) hours as needed for muscle spasms.      Multiple Vitamin (MULTIVITAMIN WITH MINERALS) TABS Take 2 tablets by mouth daily.     pantoprazole  (PROTONIX ) 40 MG tablet Take 40 mg by mouth 2 (two) times daily.     promethazine  (PHENERGAN ) 25 MG tablet Take 25 mg by mouth every 8 (eight) hours as needed for nausea or vomiting.     timolol (TIMOPTIC) 0.5 % ophthalmic solution Place 1 drop into both eyes daily.     Travoprost, BAK Free, (TRAVATAN) 0.004 % SOLN ophthalmic solution Place 1 drop into both eyes at bedtime.     traZODone  (DESYREL ) 50 MG tablet Take 1.5 tablets (75 mg total) by mouth at bedtime. (Patient taking differently: Take 50 mg by mouth at bedtime.) 135 tablet 1   Turmeric (QC TUMERIC COMPLEX PO) Take 1 capsule by mouth daily at 6 (six) AM.     ursodiol (ACTIGALL) 300 MG capsule Take 300 mg by mouth daily.     vitamin C (ASCORBIC ACID) 250 MG tablet Take 250 mg by mouth daily.     No current facility-administered medications for this visit.    Medication Side Effects: None  Allergies:  Allergies  Allergen Reactions   Aspirin  Other (See Comments)    PT CANNOT TAKE DUE TO HX OF GASTRIC BYPASS SURGERY    Loperamide Palpitations and Other (See Comments)   Statins     Joint pain   Atorvastatin      elevated LFTs   Beta Adrenergic Blockers     dyspnea   Celecoxib     UGI bleed   Desvenlafaxine     racing, twitching, everything going too fast.   Ezetimibe      liver panel went up   Nsaids     Other Reaction(s): prior GI bleed   Rosuvastatin     Other Reaction(s): elevated LFTs   Simvastatin     Other Reaction(s): elevated LF's, elevated LFTs, muscle problems   Erythromycin Other (See Comments)    UNKNOWN    Past Medical History:  Diagnosis Date   Acute MI (HCC) 11/2004   NONSTEMI 2005 NOINTERVENTION PER NOTE    Anemia    from bypass- iron and B12   Anxiety    Arthritis    Coronary artery disease    DDD (degenerative disc disease), cervical    Depression    DJD (degenerative joint disease)     Fibromyalgia    GERD (gastroesophageal reflux disease)    GIB (gastrointestinal bleeding)    while on celecoxib post bypass   Hyperlipidemia    Hyperlipidemia LDL goal < 130 01/23/2014   Hypertension    Obesity    hx of bipass, now resolved.  borderline diabetes also resolved.   Osteopenia    Post-menopausal    SCCA (squamous cell carcinoma) of skin 09/21/2015   left forearm medial cx3,54fu   SCCA (squamous cell carcinoma) of skin 07/26/2016   bridge of nose cx3 87fu   SCCA (squamous cell carcinoma) of skin 02/10/2020   right inner cheek tx after biopsy  SCCA (squamous cell carcinoma) of skin 02/10/2020   left hand tx after biopsy   Scoliosis    Sinusitis    Statin intolerance 01/23/2014    Family History  Problem Relation Age of Onset   Heart failure Mother    Liver cancer Father    Cancer Brother    Heart failure Maternal Grandmother    Aneurysm Maternal Grandmother        ruptured abdominal aortic aneurysm    Social History   Socioeconomic History   Marital status: Married    Spouse name: Eric   Number of children: 3   Years of education: LPN   Highest education level: Not on file  Occupational History   Occupation: Retired  Tobacco Use   Smoking status: Former    Current packs/day: 0.00    Average packs/day: 0.3 packs/day for 5.0 years (1.3 ttl pk-yrs)    Types: Cigarettes    Start date: 23    Quit date: 12/18/1996    Years since quitting: 27.6   Smokeless tobacco: Never  Vaping Use   Vaping status: Never Used  Substance and Sexual Activity   Alcohol  use: Yes    Alcohol /week: 1.0 - 2.0 standard drink of alcohol     Types: 1 - 2 Glasses of wine per week    Comment: rare wine   Drug use: No   Sexual activity: Not Currently    Birth control/protection: Surgical    Comment: hysterectomy-1973  Other Topics Concern   Not on file  Social History Narrative   Pt lives at home with husband.  Drives and does not use assist device.  Caffeine Use: 2 cups daily    Social Drivers of Health   Financial Resource Strain: Low Risk  (06/12/2023)   Overall Financial Resource Strain (CARDIA)    Difficulty of Paying Living Expenses: Not hard at all  Food Insecurity: Not on file  Transportation Needs: Not on file  Physical Activity: Inactive (06/12/2023)   Exercise Vital Sign    Days of Exercise per Week: 0 days    Minutes of Exercise per Session: 0 min  Stress: Stress Concern Present (06/12/2023)   [employer] of Occupational Health - Occupational Stress Questionnaire    Feeling of Stress : Very much  Social Connections: Socially Integrated (06/12/2023)   Social Connection and Isolation Panel    Frequency of Communication with Friends and Family: More than three times a week    Frequency of Social Gatherings with Friends and Family: More than three times a week    Attends Religious Services: More than 4 times per year    Active Member of [employer] or Organizations: Yes    Attends Banker Meetings: Not on file    Marital Status: Married  Intimate Partner Violence: Not At Risk (06/09/2024)   Received from [facility]   HITS    Over the last 12 months how often did your partner physically hurt you?: Never    Over the last 12 months how often did your partner insult you or talk down to you?: Never    Over the last 12 months how often did your partner threaten you with physical harm?: Never    Over the last 12 months how often did your partner scream or curse at you?: Never    Past Medical History, Surgical history, Social history, and Family history were reviewed and updated as appropriate.   Please see review of systems for further details on the patient's review  from today.   Objective:   Physical Exam:  LMP 03/18/1972 (Approximate)   Physical Exam Constitutional:      General: She is not in acute distress. Musculoskeletal:        General: No deformity.  Neurological:     Mental Status: She is alert and oriented to person,  place, and time.     Coordination: Coordination normal.  Psychiatric:        Attention and Perception: Attention and perception normal. She does not perceive auditory or visual hallucinations.        Mood and Affect: Mood normal. Mood is not anxious or depressed. Affect is not labile, blunt, angry or inappropriate.        Speech: Speech normal.        Behavior: Behavior normal.        Thought Content: Thought content normal. Thought content is not paranoid or delusional. Thought content does not include homicidal or suicidal ideation. Thought content does not include homicidal or suicidal plan.        Cognition and Memory: Cognition and memory normal.        Judgment: Judgment normal.     Comments: Insight intact     Lab Review:     Component Value Date/Time   NA 141 06/27/2024 0803   NA 142 06/18/2024 1024   K 2.9 (L) 06/27/2024 0803   CL 103 06/18/2024 1024   CO2 24 06/18/2024 1024   GLUCOSE 84 06/18/2024 1024   GLUCOSE 143 (H) 06/13/2015 0254   BUN 17 06/18/2024 1024   CREATININE 0.77 06/18/2024 1024   CALCIUM  9.1 06/18/2024 1024   PROT 6.3 (L) 06/13/2015 0254   ALBUMIN 3.1 (L) 06/13/2015 0254   AST 27 06/13/2015 0254   ALT 46 06/13/2015 0254   ALKPHOS 183 (H) 06/13/2015 0254   BILITOT 1.1 06/13/2015 0254   GFRNONAA >60 06/13/2015 0254   GFRAA >60 06/13/2015 0254       Component Value Date/Time   WBC 6.2 06/18/2024 1024   WBC 10.9 (H) 06/12/2015 0402   RBC 3.84 06/18/2024 1024   RBC 4.18 06/12/2015 0402   HGB 9.5 (L) 06/27/2024 0803   HGB 12.0 06/18/2024 1024   HCT 28.0 (L) 06/27/2024 0803   HCT 36.9 06/18/2024 1024   PLT 411 06/18/2024 1024   MCV 96 06/18/2024 1024   MCH 31.3 06/18/2024 1024   MCH 29.9 06/12/2015 0402   MCHC 32.5 06/18/2024 1024   MCHC 34.1 06/12/2015 0402   RDW 12.3 06/18/2024 1024   LYMPHSABS 1.4 06/11/2015 0451   MONOABS 0.8 06/11/2015 0451   EOSABS 0.0 06/11/2015 0451   BASOSABS 0.0 06/11/2015 0451    No results found for:  POCLITH, LITHIUM    No results found for: PHENYTOIN, PHENOBARB, VALPROATE, CBMZ   .res Assessment: Plan:    Plan:  PDMP reviewed  Wellbutrin  XL 300mg  denies seizure history Cymbalta  60mg  twice daily Trazdone 50mg  to 100mg  at hs.  Completed adult ADD screening tool. Screening tool indicating possible ADD symptoms - referred to [facility] for further evaluation.  RTC 3 months  15 minutes spent dedicated to the care of this patient on the date of this encounter to include pre-visit review of records, ordering of medication, post visit documentation, and face-to-face time with the patient discussing depression, anxiety and insomnia. Discussed continuing current medication regimen.  Patient advised to contact office with any questions, adverse effects, or acute worsening in signs and symptoms.   There are no diagnoses linked to  this encounter.   Please see After Visit Summary for patient specific instructions.  Future Appointments  Date Time Provider Department Center  08/01/2024 11:00 AM Rumi Kolodziej, Angeline Mattocks, NP CP-CP None  08/07/2024 10:30 AM [facility] CT 1 HVC-CT H&V  08/11/2024 11:20 AM [facility] 1 [facility]-LB    No orders of the defined types were placed in this encounter.     -------------------------------

## 2024-08-05 ENCOUNTER — Other Ambulatory Visit: Payer: Self-pay

## 2024-08-05 DIAGNOSIS — M8589 Other specified disorders of bone density and structure, multiple sites: Secondary | ICD-10-CM | POA: Diagnosis not present

## 2024-08-05 DIAGNOSIS — I2510 Atherosclerotic heart disease of native coronary artery without angina pectoris: Secondary | ICD-10-CM

## 2024-08-05 DIAGNOSIS — D649 Anemia, unspecified: Secondary | ICD-10-CM | POA: Diagnosis not present

## 2024-08-05 DIAGNOSIS — E7849 Other hyperlipidemia: Secondary | ICD-10-CM | POA: Diagnosis not present

## 2024-08-05 DIAGNOSIS — Z01812 Encounter for preprocedural laboratory examination: Secondary | ICD-10-CM

## 2024-08-05 DIAGNOSIS — E785 Hyperlipidemia, unspecified: Secondary | ICD-10-CM | POA: Diagnosis not present

## 2024-08-05 DIAGNOSIS — I10 Essential (primary) hypertension: Secondary | ICD-10-CM | POA: Diagnosis not present

## 2024-08-05 DIAGNOSIS — I350 Nonrheumatic aortic (valve) stenosis: Secondary | ICD-10-CM | POA: Diagnosis not present

## 2024-08-05 DIAGNOSIS — M810 Age-related osteoporosis without current pathological fracture: Secondary | ICD-10-CM | POA: Diagnosis not present

## 2024-08-05 DIAGNOSIS — Z1212 Encounter for screening for malignant neoplasm of rectum: Secondary | ICD-10-CM | POA: Diagnosis not present

## 2024-08-05 NOTE — Progress Notes (Signed)
 New orders placed for labs for cath (CBC and BMP)

## 2024-08-06 DIAGNOSIS — H26491 Other secondary cataract, right eye: Secondary | ICD-10-CM | POA: Diagnosis not present

## 2024-08-06 DIAGNOSIS — H53452 Other localized visual field defect, left eye: Secondary | ICD-10-CM | POA: Diagnosis not present

## 2024-08-06 DIAGNOSIS — H401232 Low-tension glaucoma, bilateral, moderate stage: Secondary | ICD-10-CM | POA: Diagnosis not present

## 2024-08-07 ENCOUNTER — Ambulatory Visit (HOSPITAL_COMMUNITY)
Admission: RE | Admit: 2024-08-07 | Discharge: 2024-08-07 | Disposition: A | Discharge status: Home / Self Care | Source: Ambulatory Visit | Attending: Cardiology | Admitting: Cardiology

## 2024-08-07 ENCOUNTER — Ambulatory Visit: Payer: Self-pay | Admitting: Physician Assistant

## 2024-08-07 DIAGNOSIS — Z90710 Acquired absence of both cervix and uterus: Secondary | ICD-10-CM | POA: Diagnosis not present

## 2024-08-07 DIAGNOSIS — I700 Atherosclerosis of aorta: Secondary | ICD-10-CM | POA: Diagnosis not present

## 2024-08-07 DIAGNOSIS — I350 Nonrheumatic aortic (valve) stenosis: Secondary | ICD-10-CM | POA: Insufficient documentation

## 2024-08-07 DIAGNOSIS — Z01810 Encounter for preprocedural cardiovascular examination: Secondary | ICD-10-CM | POA: Diagnosis not present

## 2024-08-07 DIAGNOSIS — I517 Cardiomegaly: Secondary | ICD-10-CM | POA: Diagnosis not present

## 2024-08-07 LAB — BASIC METABOLIC PANEL WITH GFR
BUN/Creatinine Ratio: 21 (ref 12–28)
BUN: 22 mg/dL (ref 8–27)
CO2: 16 mmol/L — ABNORMAL LOW (ref 20–29)
Calcium: 8.7 mg/dL (ref 8.7–10.3)
Chloride: 104 mmol/L (ref 96–106)
Creatinine, Ser: 1.07 mg/dL — ABNORMAL HIGH (ref 0.57–1.00)
Glucose: 117 mg/dL — ABNORMAL HIGH (ref 70–99)
Potassium: 4 mmol/L (ref 3.5–5.2)
Sodium: 141 mmol/L (ref 134–144)
eGFR: 53 mL/min/1.73 — ABNORMAL LOW (ref >59)

## 2024-08-07 MED ORDER — IOHEXOL 350 MG/ML SOLN
100.0000 mL | Freq: Once | INTRAVENOUS | Status: AC | PRN
  Administered 2024-08-07: 100 mL via INTRAVENOUS

## 2024-08-07 NOTE — Progress Notes (Signed)
 Procedure Type: Isolated AVR Perioperative Outcome Estimate % Operative Mortality 3.28% Morbidity & Mortality 8.1% Stroke 1.63% Renal Failure 1.81% Reoperation 4.82% Prolonged Ventilation 3.99% Deep Sternal Wound Infection 0.034% Long Hospital Stay (>14 days) 4.18% Short Hospital Stay (<6 days)* 43.3%

## 2024-08-08 ENCOUNTER — Other Ambulatory Visit: Payer: Self-pay | Admitting: Emergency Medicine

## 2024-08-08 DIAGNOSIS — I350 Nonrheumatic aortic (valve) stenosis: Secondary | ICD-10-CM

## 2024-08-08 DIAGNOSIS — Z01812 Encounter for preprocedural laboratory examination: Secondary | ICD-10-CM

## 2024-08-08 LAB — CBC
Hematocrit: 37.5 % (ref 34.0–46.6)
Hemoglobin: 12.3 g/dL (ref 11.1–15.9)
MCH: 31.5 pg (ref 26.6–33.0)
MCHC: 32.8 g/dL (ref 31.5–35.7)
MCV: 96 fL (ref 79–97)
Platelets: 282 x10E3/uL (ref 150–450)
RBC: 3.91 x10E6/uL (ref 3.77–5.28)
RDW: 13 % (ref 11.7–15.4)
WBC: 6 x10E3/uL (ref 3.4–10.8)

## 2024-08-08 NOTE — Progress Notes (Unsigned)
 Labs needed for upcoming procedure. Only a BMP was drawn on 8/19, a CBC is also needed. [facility] called because they need order for CBC placed- the current order has a BMP and a CBC on it.   Order placed for a CBC and released

## 2024-08-09 ENCOUNTER — Ambulatory Visit: Payer: Self-pay | Admitting: Cardiovascular Disease

## 2024-08-11 ENCOUNTER — Ambulatory Visit
Admission: RE | Admit: 2024-08-11 | Discharge: 2024-08-11 | Disposition: A | Discharge status: Home / Self Care | Source: Ambulatory Visit

## 2024-08-11 ENCOUNTER — Ambulatory Visit: Payer: Self-pay | Admitting: Cardiovascular Disease

## 2024-08-11 DIAGNOSIS — R110 Nausea: Secondary | ICD-10-CM | POA: Diagnosis not present

## 2024-08-11 DIAGNOSIS — R197 Diarrhea, unspecified: Secondary | ICD-10-CM | POA: Diagnosis not present

## 2024-08-11 DIAGNOSIS — R109 Unspecified abdominal pain: Secondary | ICD-10-CM | POA: Diagnosis not present

## 2024-08-11 DIAGNOSIS — R634 Abnormal weight loss: Secondary | ICD-10-CM

## 2024-08-11 MED ORDER — IOPAMIDOL (ISOVUE-300) INJECTION 61%
100.0000 mL | Freq: Once | INTRAVENOUS | Status: AC | PRN
  Administered 2024-08-11: 100 mL via INTRAVENOUS

## 2024-08-12 DIAGNOSIS — I2510 Atherosclerotic heart disease of native coronary artery without angina pectoris: Secondary | ICD-10-CM | POA: Diagnosis not present

## 2024-08-12 DIAGNOSIS — I119 Hypertensive heart disease without heart failure: Secondary | ICD-10-CM | POA: Diagnosis not present

## 2024-08-12 DIAGNOSIS — Z0000 Encounter for general adult medical examination without abnormal findings: Secondary | ICD-10-CM | POA: Diagnosis not present

## 2024-08-12 DIAGNOSIS — E669 Obesity, unspecified: Secondary | ICD-10-CM | POA: Diagnosis not present

## 2024-08-12 DIAGNOSIS — Z8673 Personal history of transient ischemic attack (TIA), and cerebral infarction without residual deficits: Secondary | ICD-10-CM | POA: Diagnosis not present

## 2024-08-12 DIAGNOSIS — L570 Actinic keratosis: Secondary | ICD-10-CM | POA: Diagnosis not present

## 2024-08-12 DIAGNOSIS — E785 Hyperlipidemia, unspecified: Secondary | ICD-10-CM | POA: Diagnosis not present

## 2024-08-12 DIAGNOSIS — H3530 Unspecified macular degeneration: Secondary | ICD-10-CM | POA: Diagnosis not present

## 2024-08-12 DIAGNOSIS — R82998 Other abnormal findings in urine: Secondary | ICD-10-CM | POA: Diagnosis not present

## 2024-08-12 DIAGNOSIS — F339 Major depressive disorder, recurrent, unspecified: Secondary | ICD-10-CM | POA: Diagnosis not present

## 2024-08-12 DIAGNOSIS — K8689 Other specified diseases of pancreas: Secondary | ICD-10-CM | POA: Diagnosis not present

## 2024-08-12 DIAGNOSIS — I350 Nonrheumatic aortic (valve) stenosis: Secondary | ICD-10-CM | POA: Diagnosis not present

## 2024-08-12 DIAGNOSIS — M810 Age-related osteoporosis without current pathological fracture: Secondary | ICD-10-CM | POA: Diagnosis not present

## 2024-08-13 ENCOUNTER — Telehealth (HOSPITAL_COMMUNITY): Payer: Self-pay

## 2024-08-13 ENCOUNTER — Encounter: Admitting: Surgery

## 2024-08-13 NOTE — Telephone Encounter (Signed)
 Auth Submission: NO AUTH NEEDED Site of care: Site of care: [facility] Payer: Medicare A/B, BCBS Supplement Medication & CPT/J Code(s) submitted: Prolia (Denosumab) [id number] Diagnosis Code: M81.0 Route of submission (phone, fax, portal):  Phone # Fax # Auth type: Buy/Bill HB Units/visits requested: 60mg  q 6 months x 2 doses Reference number:  Approval from: 08/13/24 to 01/17/25

## 2024-08-14 ENCOUNTER — Other Ambulatory Visit: Payer: Self-pay | Admitting: Adult Health

## 2024-08-14 DIAGNOSIS — F331 Major depressive disorder, recurrent, moderate: Secondary | ICD-10-CM

## 2024-08-14 DIAGNOSIS — F411 Generalized anxiety disorder: Secondary | ICD-10-CM

## 2024-08-20 DIAGNOSIS — M9902 Segmental and somatic dysfunction of thoracic region: Secondary | ICD-10-CM | POA: Diagnosis not present

## 2024-08-20 DIAGNOSIS — M797 Fibromyalgia: Secondary | ICD-10-CM | POA: Diagnosis not present

## 2024-08-20 DIAGNOSIS — M9901 Segmental and somatic dysfunction of cervical region: Secondary | ICD-10-CM | POA: Diagnosis not present

## 2024-08-20 DIAGNOSIS — M9905 Segmental and somatic dysfunction of pelvic region: Secondary | ICD-10-CM | POA: Diagnosis not present

## 2024-08-20 DIAGNOSIS — M4726 Other spondylosis with radiculopathy, lumbar region: Secondary | ICD-10-CM | POA: Diagnosis not present

## 2024-08-20 DIAGNOSIS — M47812 Spondylosis without myelopathy or radiculopathy, cervical region: Secondary | ICD-10-CM | POA: Diagnosis not present

## 2024-08-20 DIAGNOSIS — M9904 Segmental and somatic dysfunction of sacral region: Secondary | ICD-10-CM | POA: Diagnosis not present

## 2024-08-20 DIAGNOSIS — M4125 Other idiopathic scoliosis, thoracolumbar region: Secondary | ICD-10-CM | POA: Diagnosis not present

## 2024-08-20 DIAGNOSIS — M9903 Segmental and somatic dysfunction of lumbar region: Secondary | ICD-10-CM | POA: Diagnosis not present

## 2024-08-26 ENCOUNTER — Other Ambulatory Visit (HOSPITAL_COMMUNITY): Payer: Self-pay | Admitting: Internal Medicine

## 2024-08-26 ENCOUNTER — Telehealth: Payer: Self-pay | Admitting: Adult Health

## 2024-08-26 DIAGNOSIS — M810 Age-related osteoporosis without current pathological fracture: Secondary | ICD-10-CM | POA: Insufficient documentation

## 2024-08-26 NOTE — Progress Notes (Unsigned)
 [facility] Consult Note  No chief complaint on file.  History of Present Illness: 78 yo female with history of CAD, anxiety, depression, fibromyalgia, GERD, HLD, HTN and mixed aortic valve disease with aortic stenosis and aortic insufficiency who is here today as a new consult, referred by Dr. Francyne, for further discussion regarding her aortic valve disease. Echo May 2025 with LVEF=55-60%. Grade 2 diastolic dysfunction. Mild MR. Moderate to severe aortic valve stenosis (mean gradient 22 mmHg, AVA 0.85 cm2, DI 0.24, SVI 37). Moderate aortic valve insufficiency. Cardiac cath 06/27/24 with no evidence of CAD. Normal right heart pressures. Cardiac CT August 2025 with AV calcium  score of 656. AV annular area of 406 mm2 suitable for a 23 mm Edwards Sapien 3 THV.   She tells me today that she *** She lives in *** She is retired Engineer, manufacturing ***  Primary Care Physician: Shayne Anes, MD Primary Cardiologist: Croitoru Referring Cardiologist: Croitoru  Past Medical History:  Diagnosis Date   Acute MI (HCC) 11/2004   NONSTEMI 2005 NOINTERVENTION PER NOTE    Anemia    from bypass- iron and B12   Anxiety    Arthritis    Coronary artery disease    DDD (degenerative disc disease), cervical    Depression    DJD (degenerative joint disease)    Fibromyalgia    GERD (gastroesophageal reflux disease)    GIB (gastrointestinal bleeding)    while on celecoxib post bypass   Hyperlipidemia    Hyperlipidemia LDL goal < 130 01/23/2014   Hypertension    Obesity    hx of bipass, now resolved.  borderline diabetes also resolved.   Osteopenia    Post-menopausal    SCCA (squamous cell carcinoma) of skin 09/21/2015   left forearm medial cx3,43fu   SCCA (squamous cell carcinoma) of skin 07/26/2016   bridge of nose cx3 32fu   SCCA (squamous cell carcinoma) of skin 02/10/2020   right inner cheek tx after biopsy   SCCA (squamous cell carcinoma) of skin 02/10/2020   left hand tx after biopsy    Scoliosis    Sinusitis    Statin intolerance 01/23/2014    Past Surgical History:  Procedure Laterality Date   CARDIAC CATHETERIZATION     2005 Severely disease small optional diagonal vessel responsible far wall motion abnormalities wise to small and diffusely diseased for consideration of interventio.   CATARACT EXTRACTION     COLONOSCOPY     GASTRIC BYPASS  01/2004   KNEE ARTHROSCOPY Right    LAPAROSCOPIC CHOLECYSTECTOMY  1993   RIGHT HEART CATH AND CORONARY ANGIOGRAPHY N/A 06/27/2024   Procedure: RIGHT HEART CATH AND CORONARY ANGIOGRAPHY;  Surgeon: Verlin Lonni BIRCH, MD;  Location: [facility];  Service: Cardiovascular;  Laterality: N/A;   TOTAL VAGINAL HYSTERECTOMY  1973   secondary to DUB - Dr. Elsa and Dr. Claudene    Current Outpatient Medications  Medication Sig Dispense Refill   acetaminophen  (TYLENOL ) 650 MG CR tablet Take 1,300 mg by mouth 2 (two) times daily as needed for pain.     amLODipine  (NORVASC ) 5 MG tablet Take 5 mg by mouth daily.     buPROPion  (WELLBUTRIN  XL) 300 MG 24 hr tablet Take 1 tablet (300 mg total) by mouth daily. 90 tablet 1   Cholecalciferol (VITAMIN D3 ULTRA STRENGTH) 125 MCG (5000 UT) capsule Take 5,000 Units by mouth daily.     clopidogrel  (PLAVIX ) 75 MG tablet TAKE 1 TABLET (75 MG TOTAL) BY MOUTH DAILY.  PLEASE SCHEDULE APPOINTMENT FOR REFILLS. 7 tablet 0   diphenoxylate-atropine (LOMOTIL) 2.5-0.025 MG tablet Take 1 tablet by mouth 4 (four) times daily as needed.     DULoxetine  (CYMBALTA ) 60 MG capsule Take 1 capsule (60 mg total) by mouth 2 (two) times daily. (Patient taking differently: Take 60 mg by mouth daily.) 180 capsule 1   Evolocumab (REPATHA) 140 MG/ML SOSY Inject 140 mg into the skin every 21 ( twenty-one) days.     furosemide  (LASIX ) 20 MG tablet Take 1 tablet (20 mg total) by mouth daily. Be sure to eat potassium rich foods while taking Furosemide . 90 tablet 3   meclizine (ANTIVERT) 25 MG tablet Take 25 mg by mouth 3 (three)  times daily as needed.     methocarbamol  (ROBAXIN ) 500 MG tablet Take 500 mg by mouth every 8 (eight) hours as needed for muscle spasms.     Multiple Vitamin (MULTIVITAMIN WITH MINERALS) TABS Take 2 tablets by mouth daily.     pantoprazole  (PROTONIX ) 40 MG tablet Take 40 mg by mouth 2 (two) times daily.     promethazine  (PHENERGAN ) 25 MG tablet Take 25 mg by mouth every 8 (eight) hours as needed for nausea or vomiting.     timolol (TIMOPTIC) 0.5 % ophthalmic solution Place 1 drop into both eyes daily.     Travoprost, BAK Free, (TRAVATAN) 0.004 % SOLN ophthalmic solution Place 1 drop into both eyes at bedtime.     traZODone  (DESYREL ) 50 MG tablet Take 1.5 tablets (75 mg total) by mouth at bedtime. (Patient taking differently: Take 50 mg by mouth at bedtime.) 135 tablet 1   Turmeric (QC TUMERIC COMPLEX PO) Take 1 capsule by mouth daily at 6 (six) AM.     ursodiol (ACTIGALL) 300 MG capsule Take 300 mg by mouth daily.     vitamin C (ASCORBIC ACID) 250 MG tablet Take 250 mg by mouth daily.     No current facility-administered medications for this visit.    Allergies  Allergen Reactions   Aspirin  Other (See Comments)    PT CANNOT TAKE DUE TO HX OF GASTRIC BYPASS SURGERY    Loperamide Palpitations and Other (See Comments)   Statins     Joint pain   Atorvastatin      elevated LFTs   Beta Adrenergic Blockers     dyspnea   Celecoxib     UGI bleed   Desvenlafaxine     racing, twitching, everything going too fast.   Ezetimibe      liver panel went up   Nsaids     Other Reaction(s): prior GI bleed   Rosuvastatin     Other Reaction(s): elevated LFTs   Simvastatin     Other Reaction(s): elevated LF's, elevated LFTs, muscle problems   Erythromycin Other (See Comments)    UNKNOWN    Social History   Socioeconomic History   Marital status: Married    Spouse name: Eric   Number of children: 3   Years of education: LPN   Highest education level: Not on file  Occupational History    Occupation: Retired  Tobacco Use   Smoking status: Former    Current packs/day: 0.00    Average packs/day: 0.3 packs/day for 5.0 years (1.3 ttl pk-yrs)    Types: Cigarettes    Start date: 29    Quit date: 12/18/1996    Years since quitting: 27.7   Smokeless tobacco: Never  Vaping Use   Vaping status: Never Used  Substance and Sexual  Activity   Alcohol  use: Yes    Alcohol /week: 1.0 - 2.0 standard drink of alcohol     Types: 1 - 2 Glasses of wine per week    Comment: rare wine   Drug use: No   Sexual activity: Not Currently    Birth control/protection: Surgical    Comment: hysterectomy-1973  Other Topics Concern   Not on file  Social History Narrative   Pt lives at home with husband.  Drives and does not use assist device.  Caffeine Use: 2 cups daily   Social Drivers of Health   Financial Resource Strain: Low Risk  (06/12/2023)   Overall Financial Resource Strain (CARDIA)    Difficulty of Paying Living Expenses: Not hard at all  Food Insecurity: Not on file  Transportation Needs: Not on file  Physical Activity: Inactive (06/12/2023)   Exercise Vital Sign    Days of Exercise per Week: 0 days    Minutes of Exercise per Session: 0 min  Stress: Stress Concern Present (06/12/2023)   [employer] of Occupational Health - Occupational Stress Questionnaire    Feeling of Stress : Very much  Social Connections: Socially Integrated (06/12/2023)   Social Connection and Isolation Panel    Frequency of Communication with Friends and Family: More than three times a week    Frequency of Social Gatherings with Friends and Family: More than three times a week    Attends Religious Services: More than 4 times per year    Active Member of [employer] or Organizations: Yes    Attends Banker Meetings: Not on file    Marital Status: Married  Intimate Partner Violence: Not At Risk (06/09/2024)   Received from [facility]   HITS    Over the last 12 months how often did your partner  physically hurt you?: Never    Over the last 12 months how often did your partner insult you or talk down to you?: Never    Over the last 12 months how often did your partner threaten you with physical harm?: Never    Over the last 12 months how often did your partner scream or curse at you?: Never    Family History  Problem Relation Age of Onset   Heart failure Mother    Liver cancer Father    Cancer Brother    Heart failure Maternal Grandmother    Aneurysm Maternal Grandmother        ruptured abdominal aortic aneurysm    Review of Systems:  As stated in the HPI and otherwise negative.   LMP 03/18/1972 (Approximate)   Physical Examination: General: Well developed, well nourished, NAD  HEENT: OP clear, mucus membranes moist  SKIN: warm, dry. No rashes. Neuro: No focal deficits  Musculoskeletal: Muscle strength 5/5 all ext  Psychiatric: Mood and affect normal  Neck: No JVD, no carotid bruits, no thyromegaly, no lymphadenopathy.  Lungs:Clear bilaterally, no wheezes, rhonci, crackles Cardiovascular: Regular rate and rhythm. *** Loud, harsh, late peaking systolic murmur.  Abdomen:Soft. Bowel sounds present. Non-tender.  Extremities: *** No lower extremity edema. Pulses are 2 + in the bilateral DP/PT.  EKG:  EKG {ACTION; IS/IS WNU:[id number]} ordered today. The ekg ordered today demonstrates ***  Echo May 2025:  1. Left ventricular ejection fraction, by estimation, is 55 to 60%. The  left ventricle has normal function. The left ventricle has no regional  wall motion abnormalities. Left ventricular diastolic parameters are  consistent with Grade II diastolic  dysfunction (pseudonormalization). Elevated left atrial  pressure. The E/e'  is 16.   2. Right ventricular systolic function is normal. The right ventricular  size is normal. Tricuspid regurgitation signal is inadequate for assessing  PA pressure.   3. Left atrial size was moderately dilated.   4. The mitral valve is  degenerative. Mild mitral valve regurgitation. No  evidence of mitral stenosis.   5. Native aortic valve, trileaflet, calcified leaflets and cups, reduced  excursion, moderate to severe aortic stenosis (peak velocity at RSB  3.2m/s, MG 22 mmHG, AVA (VTI) 0.85cm2, DI 0.24), mild to moderate aortic  regurgitation.   6. The inferior vena cava is normal in size with greater than 50%  respiratory variability, suggesting right atrial pressure of 3 mmHg.   Comparison(s): A prior study was performed on 08/31/2021. Reported LVEF at  60-65%, mild MR, mild AS, normal diastolic function.   FINDINGS   Left Ventricle: Left ventricular ejection fraction, by estimation, is 55  to 60%. The left ventricle has normal function. The left ventricle has no  regional wall motion abnormalities. The left ventricular internal cavity  size was normal in size. There is   no left ventricular hypertrophy. Abnormal (paradoxical) septal motion,  consistent with left bundle branch block. Left ventricular diastolic  parameters are consistent with Grade II diastolic dysfunction  (pseudonormalization). Elevated left atrial  pressure. The E/e' is 16.   Right Ventricle: The right ventricular size is normal. No increase in  right ventricular wall thickness. Right ventricular systolic function is  normal. Tricuspid regurgitation signal is inadequate for assessing PA  pressure.   Left Atrium: Left atrial size was moderately dilated.   Right Atrium: Right atrial size was normal in size.   Pericardium: There is no evidence of pericardial effusion.   Mitral Valve: The mitral valve is degenerative in appearance. There is  mild thickening of the mitral valve leaflet(s). Normal mobility of the  mitral valve leaflets. Mild mitral annular calcification. Mild mitral  valve regurgitation. No evidence of mitral   valve stenosis.   Tricuspid Valve: The tricuspid valve is grossly normal. Tricuspid valve  regurgitation is not  demonstrated. No evidence of tricuspid stenosis.   Native aortic valve, trileaflet, calcified leaflets and cups, reduced  excursion, moderate to severe aortic stenosis (peak velocity at RSB  3.21m/s, MG 22 mmHG, AVA (VTI) 0.85cm2, DI 0.24), mild to moderate aortic  regurgitation.  Pulmonic Valve: The pulmonic valve was normal in structure. Pulmonic valve  regurgitation is trivial. No evidence of pulmonic stenosis.   Aorta: The aortic root and ascending aorta are structurally normal, with  no evidence of dilitation.   Venous: The inferior vena cava is normal in size with greater than 50%  respiratory variability, suggesting right atrial pressure of 3 mmHg.   IAS/Shunts: There is redundancy of the interatrial septum. The atrial  septum is grossly normal.     LEFT VENTRICLE  PLAX 2D  LVIDd:         4.70 cm   Diastology  LVIDs:         3.40 cm   LV e' medial:    4.57 cm/s  LV PW:         1.00 cm   LV E/e' medial:  18.5  LV IVS:        0.90 cm   LV e' lateral:   6.31 cm/s  LVOT diam:     2.10 cm   LV E/e' lateral: 13.4  LV SV:  56  LV SV Index:   37  LVOT Area:     3.46 cm     RIGHT VENTRICLE             IVC  RV Basal diam:  3.70 cm     IVC diam: 1.30 cm  RV S prime:     12.60 cm/s  TAPSE (M-mode): 1.3 cm   LEFT ATRIUM             Index        RIGHT ATRIUM           Index  LA diam:        4.00 cm 2.66 cm/m   RA Area:     10.50 cm  LA Vol (A2C):   61.1 ml 40.56 ml/m  RA Volume:   23.00 ml  15.27 ml/m  LA Vol (A4C):   66.7 ml 44.27 ml/m  LA Biplane Vol: 64.7 ml 42.95 ml/m   AORTIC VALVE  AV Area (Vmax):    0.97 cm  AV Area (Vmean):   0.96 cm  AV Area (VTI):     0.81 cm  AV Vmax:           281.20 cm/s  AV Vmean:          197.600 cm/s  AV VTI:            0.687 m  AV Peak Grad:      31.6 mmHg  AV Mean Grad:      18.4 mmHg  LVOT Vmax:         78.35 cm/s  LVOT Vmean:        54.500 cm/s  LVOT VTI:          0.162 m  LVOT/AV VTI ratio: 0.24  AI PHT:             495 msec    AORTA  Ao Root diam: 3.20 cm  Ao Asc diam:  3.60 cm   MITRAL VALVE  MV Area (PHT): 3.21 cm    SHUNTS  MV Decel Time: 236 msec    Systemic VTI:  0.16 m  MV E velocity: 84.40 cm/s  Systemic Diam: 2.10 cm  MV A velocity: 97.10 cm/s  MV E/A ratio:  0.87   Cardiac cath 06/27/24: No angiographic evidence of CAD Normal right heart pressures   Will continue workup for TAVR.    Procedural Details  Technical Details Indication: 8 with LFLG severe AS.   Procedure: The risks, benefits, complications, treatment options, and expected outcomes were discussed with the patient. The patient and/or family concurred with the proposed plan, giving informed consent. The patient was sedated with Versed  and Fentanyl . The right antecubital area was prepped and draped. Using u/s guidance, I placed a 5 French sheath in the right brachial vein and performed right heart catheterization with a balloon tipped catheter. The right wrist was prepped and draped in a sterile fashion. 1% lidocaine  was used for local anesthesia. Using the modified Seldinger access technique, a 5 French sheath was placed in the right radial artery using u/s guidance. 3 mg Verapamil  was given through the sheath. Weight based IV heparin  was given. Standard diagnostic catheters were used to perform selective coronary angiography. I did not cross the aortic valve. All catheter exchanges were performed over an exchange length guidewire.   The sheath was removed from the right radial artery and a hemostasis band was applied at the arteriotomy site on the right wrist.  Estimated blood loss <50 mL.   During this procedure medications were administered to achieve and maintain moderate conscious sedation while the patient's heart rate, blood pressure, and oxygen saturation were continuously monitored and I was present face-to-face 100% of this time. Geni Fake Cardiovascular Specialist and Carrolyn Gals RN are independent, trained  observers who assisted in the monitoring of the patient's level of consciousness.   Medications (Filter: Administrations occurring from 0727 to 0817 on 06/27/24) fentaNYL  (SUBLIMAZE ) injection (mcg)  Total dose: 25 mcg Date/Time Rate/Dose/Volume Action   06/27/24 0738 25 mcg Given   midazolam  (VERSED ) injection (mg)  Total dose: 1 mg Date/Time Rate/Dose/Volume Action   06/27/24 0738 1 mg Given   lidocaine  (PF) (XYLOCAINE ) 1 % injection (mL)  Total volume: 4 mL Date/Time Rate/Dose/Volume Action   06/27/24 0751 2 mL Given   0754 2 mL Given   Radial Cocktail/Verapamil  only (mL)  Total volume: 10 mL Date/Time Rate/Dose/Volume Action   06/27/24 0755 10 mL Given   Heparin  (Porcine) in NaCl 1000-0.9 UT/500ML-% SOLN (mL)  Total volume: 1,000 mL Date/Time Rate/Dose/Volume Action   06/27/24 0757 1,000 mL Given   heparin  sodium (porcine) injection (Units)  Total dose: 3,000 Units Date/Time Rate/Dose/Volume Action   06/27/24 0802 3,000 Units Given   iohexol  (OMNIPAQUE ) 350 MG/ML injection (mL)  Total volume: 35 mL Date/Time Rate/Dose/Volume Action   06/27/24 0808 35 mL Given    Sedation Time  Sedation Time Physician-1: 31 minutes 41 seconds Contrast     Administrations occurring from 0727 to 0817 on 06/27/24:  Medication Name Total Dose  iohexol  (OMNIPAQUE ) 350 MG/ML injection 35 mL   Radiation/Fluoro  Fluoro time: 4.4 (min) DAP: 3806 (mGycm2) Cumulative Air Kerma: 69 (mGy) Complications  Complications documented before study signed (06/27/2024 10:45 AM)   No complications were associated with this study.  Documented by Gals Carrolyn SAUNDERS, RN - 06/27/2024  8:15 AM     Coronary Findings  Diagnostic Dominance: Right Left Anterior Descending  Vessel is large.    Left Circumflex  Vessel is large.    Right Coronary Artery  Vessel is large.    Intervention   No interventions have been documented.   Coronary Diagrams  Diagnostic Dominance: Right  Intervention    Implants   No implant documentation for this case.   Syngo Images   Show images for CARDIAC CATHETERIZATION Images on Long Term Storage   Show images for Rivka, Baune to Procedure Log  Procedure Log    Hemo Data  Flowsheet Row Most Recent Value  Fick Cardiac Output 5.04 L/min  Fick Cardiac Output Index 3.4 (L/min)/BSA  RA A Wave 7 mmHg  RA V Wave 5 mmHg  RA Mean 5 mmHg  RV Systolic Pressure 28 mmHg  RV Diastolic Pressure 0 mmHg  RV EDP 7 mmHg  PA Systolic Pressure 28 mmHg  PA Diastolic Pressure 15 mmHg  PA Mean 18 mmHg  PW A Wave 13 mmHg  PW V Wave 13 mmHg  PW Mean 13 mmHg  AO Systolic Pressure 128 mmHg  AO Diastolic Pressure 61 mmHg  AO Mean 86 mmHg  QP/QS 1  TPVR Index 5.3 HRUI  TSVR Index 25.32 HRUI  PVR SVR Ratio 0.06  TPVR/TSVR Ratio 0.21   Cardiac CT 08/07/24: FINDINGS: Aortic Valve: Calcified tri leaflet with score 656   Aorta: No aneurysm, mild calcific atherosclerosis normal arch vessels   Sino-tubular Junction: 25.2 mm   Ascending Thoracic Aorta: 32.4 mm   Aortic Arch: 30 mm  Descending Thoracic Aorta: 23.5 mm   Sinus of Valsalva Measurements:   Non-coronary: 32 mm Height 18.6 mm   Right - coronary: 29.1 mm  Height 19.4   Left -   coronary: 29 mm Height 18.3 mm   Coronary Artery Height above Annulus:   Left Main: 14.3 mm above annulus   Right Coronary: 18.1 mm above annulus   Virtual Basal Annulus Measurements:   Maximum / Minimum Diameter: 22.7 mm x 21.3 mm   Perimeter: 72.1 mm   Area: 406 mm2   Coronary Arteries: Sufficient height above annulus for deployment   Optimum Fluoroscopic Angle for Delivery: LAO 18 Caudal 10 degrees   Membranous septal length 9.4 mm   IMPRESSION: 1.  Tri leaflet AV with calcium  score 656   2. Annular area of 406 mm2 suitable for a 23 mm Sapien 3 valve. Alternatively a 26 mm Medtronic Evolut valve can be considered   3. Optimum angiographic angle for deployment LAO 18 Caudal  10 degrees   4.  Membranous septal length 9.4 mm   5.  Coronary arteries sufficient height above annulus for deployment  CT abd/pelvis/chest: 08/07/24: VASCULAR   Aorta: Patent, mild atherosclerotic changes, no aneurysm. Minimal diameter estimated at 13 mm.   Celiac: Downward displacement of the proximal celiac artery with moderate stenosis in the proximal segment with poststenotic dilatation measuring 11 mm.   SMA: Patent.   Renals: Patent.   IMA: Patent.   Inflow: Right common iliac artery is patent with minimal diameter estimated at 8 mm. The right internal iliac artery is patent. The right external iliac artery is patent with minimal diameter estimated at 7 mm.   The left common iliac artery is patent with minimal diameter estimated at 8 mm. The left internal iliac artery is patent. The left external iliac artery is patent with minimal diameter estimated at 7 mm.   Proximal Outflow: The right and left common femoral arteries are patent with minimal diameter estimated at 7 mm.   Veins: No obvious venous abnormality within the limitations of this arterial phase study.   Review of the MIP images confirms the above findings.   NON-VASCULAR   Lower chest: Reported separately.   Hepatobiliary: Postsurgical changes from cholecystectomy.   Pancreas: Unremarkable. No pancreatic ductal dilatation or surrounding inflammatory changes.   Spleen: Normal in size without focal abnormality.   Adrenals/Urinary Tract: Mild diffuse adrenal gland thickening. No hydronephrosis. Urinary bladder is grossly unremarkable.   Stomach/Bowel: Small hiatal hernia. Postsurgical changes are present in the stomach.   Lymphatic: No evidence of significant lymphadenopathy.   Reproductive: Status post hysterectomy. No adnexal masses.   Other: Nothing significant.   Musculoskeletal: Diffuse osteopenia.   IMPRESSION: 1. Minimal vessel diameters detailed above. 2. 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 The degree of dilatation is similar when compared to CT performed June 08, 2015.  Recent Labs: 08/05/2024: BUN 22; Creatinine, Ser 1.07; Potassium 4.0; Sodium 141 08/08/2024: Hemoglobin 12.3; Platelets 282     Wt Readings from Last 3 Encounters:  06/27/24 110 lb (49.9 kg)  06/18/24 111 lb (50.3 kg)  06/18/24 110 lb (49.9 kg)    Assessment and Plan:   1. Severe Aortic Valve Stenosis: She has moderately severe to severe paradoxical low flow/low gradient aortic stenosis with moderate aortic valve insufficiency. NYHA class *** symptoms. I have personally reviewed the echo images. The aortic valve is thickened and calcified with limited leaflet mobility. I think she  would benefit from AVR. Given advanced age, she is not a good candidate for conventional AVR by surgical approach. I think she may be a good candidate for TAVR.   I have reviewed the natural history of aortic stenosis with the patient and their family members  who are present today. We have discussed the limitations of medical therapy and the poor prognosis associated with symptomatic aortic stenosis. We have reviewed potential treatment options, including palliative medical therapy, conventional surgical aortic valve replacement, and transcatheter aortic valve replacement. We discussed treatment options in the context of the patient's specific comorbid medical conditions.   She would like to proceed with planning for TAVR.She will be referred to see one of the CT surgeons on our TAVR team.     Labs/ tests ordered today include:  No orders of the defined types were placed in this encounter.  Disposition:   F/U will be arranged with the structural team  Signed, Lonni Cash, MD, [facility] 08/26/2024 4:15 PM    [facility] [address] [place], [place], [place]  [address] Phone: [phone]; Fax: [phone]

## 2024-08-26 NOTE — Telephone Encounter (Signed)
 LVM per DPR with options.
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
## 2024-08-26 NOTE — Telephone Encounter (Signed)
 Please see message and advise. Pt does take trazodone .

## 2024-08-27 ENCOUNTER — Encounter: Payer: Self-pay | Admitting: Cardiovascular Disease

## 2024-08-27 ENCOUNTER — Ambulatory Visit: Attending: Cardiovascular Disease | Admitting: Cardiovascular Disease

## 2024-08-27 VITALS — BP 120/60 | HR 87 | Ht 62.0 in | Wt 111.4 lb

## 2024-08-27 DIAGNOSIS — I350 Nonrheumatic aortic (valve) stenosis: Secondary | ICD-10-CM | POA: Insufficient documentation

## 2024-08-27 DIAGNOSIS — I351 Nonrheumatic aortic (valve) insufficiency: Secondary | ICD-10-CM | POA: Diagnosis present

## 2024-08-27 NOTE — Patient Instructions (Signed)
 Medication Instructions:  Your physician recommends that you continue on your current medications as directed. Please refer to the Current Medication list given to you today.  *If you need a refill on your cardiac medications before your next appointment, please call your pharmacy*  Lab Work: None.  If you have labs (blood work) drawn today and your tests are completely normal, you will receive your results only by: MyChart Message (if you have MyChart) OR A paper copy in the mail If you have any lab test that is abnormal or we need to change your treatment, we will call you to review the results.  Testing/Procedures: None.  Follow-Up: At [facility], you and your health needs are our priority.  As part of our continuing mission to provide you with exceptional heart care, our providers are all part of one team.  This team includes your primary Cardiologist (physician) and Advanced Practice Providers or APPs (Physician Assistants and Nurse Practitioners) who all work together to provide you with the care you need, when you need it.  Your next appointment will be per the Structural Heart Team.

## 2024-08-27 NOTE — Progress Notes (Signed)
 Pre Surgical Assessment: 5 M Walk Test  84M=16.71ft  5 Meter Walk Test- trial 1: 5.72 seconds 5 Meter Walk Test- trial 2: 5.8 seconds 5 Meter Walk Test- trial 3: 5.15 seconds 5 Meter Walk Test Average: 5.56 seconds

## 2024-09-04 ENCOUNTER — Ambulatory Visit

## 2024-09-04 VITALS — BP 124/73 | HR 65 | Resp 20 | Ht 62.0 in | Wt 111.0 lb

## 2024-09-04 DIAGNOSIS — I350 Nonrheumatic aortic (valve) stenosis: Secondary | ICD-10-CM | POA: Diagnosis not present

## 2024-09-04 NOTE — Progress Notes (Signed)
 [facility]   MULTIDISCIPLINARY HEART VALVE CLINIC    Kimberly Stone [facility] Medical Record #[id number] Date of Birth: [birth year]  Referring: Kimberly Headland, MD Primary Care: Kimberly Anes, MD Primary Cardiologist:Kimberly Croitoru, MD  Chief Complaint:    Chief Complaint  Patient presents with   Aortic Stenosis    TAVR consult, review all testing    History of Present Illness:     Kimberly Stone is a 78 y.o. female presents for evaluation of severe aortic stenosis and TAVR.  She has a history of coronary artery disease and NSTEMI in 2005 not amenable to PCI.  She also has a history of left bundle branch block, hypertension, hyperlipidemia, low-flow low gradient severe aortic stenosis with moderate AI.  She also has a history of gastric bypass surgery.  She recently was having diarrhea and found to have pancreatic insufficiency, she is now on Creon which has helped significantly. Her weight previously was 125 and she is now down to 104-106 lbs.  Her symptoms include dyspnea with exertion, fatigue, and palpitations.  The palpitations resolved with rest.  She denies dizziness or chest pain.  She notes that her dyspnea is the worst when she is working around the house, working outside, climbing stairs, or even walking short distances.  She denies history of stroke or renal issues.  She sees a Education officer, community twice yearly.  Denies any dental issues.  She has an appointment with the dentist next Monday.  Past Medical History:  Diagnosis Date   Acute MI (HCC) 11/2004   NONSTEMI 2005 NOINTERVENTION PER NOTE    Anemia    from bypass- iron and B12   Anxiety    Aortic stenosis    Arthritis    Coronary artery disease    DDD (degenerative disc disease), cervical    Depression    DJD (degenerative joint disease)    Fibromyalgia    GERD (gastroesophageal reflux disease)    GIB (gastrointestinal bleeding)    while on celecoxib post bypass   Hyperlipidemia    Hyperlipidemia LDL  goal < 130 01/23/2014   Hypertension    Obesity    hx of bipass, now resolved.  borderline diabetes also resolved.   Osteopenia    Post-menopausal    SCCA (squamous cell carcinoma) of skin 09/21/2015   left forearm medial cx3,68fu   SCCA (squamous cell carcinoma) of skin 07/26/2016   bridge of nose cx3 58fu   SCCA (squamous cell carcinoma) of skin 02/10/2020   right inner cheek tx after biopsy   SCCA (squamous cell carcinoma) of skin 02/10/2020   left hand tx after biopsy   Scoliosis    Sinusitis    Statin intolerance 01/23/2014    Past Surgical History:  Procedure Laterality Date   CARDIAC CATHETERIZATION     2005 Severely disease small optional diagonal vessel responsible far wall motion abnormalities wise to small and diffusely diseased for consideration of interventio.   CATARACT EXTRACTION     COLONOSCOPY     GASTRIC BYPASS  01/2004   KNEE ARTHROSCOPY Right    LAPAROSCOPIC CHOLECYSTECTOMY  1993   RIGHT HEART CATH AND CORONARY ANGIOGRAPHY N/A 06/27/2024   Procedure: RIGHT HEART CATH AND CORONARY ANGIOGRAPHY;  Surgeon: Verlin Lonni BIRCH, MD;  Location: [facility] INVASIVE CV LAB;  Service: Cardiovascular;  Laterality: N/A;   TOTAL VAGINAL HYSTERECTOMY  1973   secondary to DUB - Dr. Elsa and Dr. Claudene    Social History:  Social History   Tobacco  Use  Smoking Status Former   Current packs/day: 0.00   Average packs/day: 0.3 packs/day for 5.0 years (1.3 ttl pk-yrs)   Types: Cigarettes   Start date: 64   Quit date: 12/18/1996   Years since quitting: 27.7  Smokeless Tobacco Never    Social History   Substance and Sexual Activity  Alcohol  Use Yes   Alcohol /week: 1.0 - 2.0 standard drink of alcohol    Types: 1 - 2 Glasses of wine per week   Comment: rare wine     Allergies  Allergen Reactions   Aspirin  Other (See Comments)    PT CANNOT TAKE DUE TO HX OF GASTRIC BYPASS SURGERY    Loperamide Palpitations and Other (See Comments)   Statins     Joint pain    Atorvastatin      elevated LFTs   Beta Adrenergic Blockers     dyspnea   Celecoxib     UGI bleed   Desvenlafaxine     racing, twitching, everything going too fast.   Ezetimibe      liver panel went up   Nsaids     Other Reaction(s): prior GI bleed   Rosuvastatin     Other Reaction(s): elevated LFTs   Simvastatin     Other Reaction(s): elevated LF's, elevated LFTs, muscle problems   Erythromycin Other (See Comments)    UNKNOWN      Current Outpatient Medications  Medication Sig Dispense Refill   acetaminophen  (TYLENOL ) 650 MG CR tablet Take 1,300 mg by mouth 2 (two) times daily as needed for pain.     amLODipine  (NORVASC ) 5 MG tablet Take 5 mg by mouth daily.     buPROPion  (WELLBUTRIN  XL) 300 MG 24 hr tablet Take 1 tablet (300 mg total) by mouth daily. 90 tablet 1   Cholecalciferol (VITAMIN D3 ULTRA STRENGTH) 125 MCG (5000 UT) capsule Take 5,000 Units by mouth daily.     clopidogrel  (PLAVIX ) 75 MG tablet TAKE 1 TABLET (75 MG TOTAL) BY MOUTH DAILY. PLEASE SCHEDULE APPOINTMENT FOR REFILLS. 7 tablet 0   diphenoxylate-atropine (LOMOTIL) 2.5-0.025 MG tablet Take 1 tablet by mouth 4 (four) times daily as needed.     DULoxetine  (CYMBALTA ) 60 MG capsule Take 1 capsule (60 mg total) by mouth 2 (two) times daily. 180 capsule 1   Evolocumab (REPATHA) 140 MG/ML SOSY Inject 140 mg into the skin every 21 ( twenty-one) days.     furosemide  (LASIX ) 20 MG tablet Take 1 tablet (20 mg total) by mouth daily. Be sure to eat potassium rich foods while taking Furosemide . 90 tablet 3   meclizine (ANTIVERT) 25 MG tablet Take 25 mg by mouth 3 (three) times daily as needed.     methocarbamol  (ROBAXIN ) 500 MG tablet Take 500 mg by mouth every 8 (eight) hours as needed for muscle spasms.     Multiple Vitamin (MULTIVITAMIN WITH MINERALS) TABS Take 2 tablets by mouth daily.     pantoprazole  (PROTONIX ) 40 MG tablet Take 40 mg by mouth 2 (two) times daily.     promethazine  (PHENERGAN ) 25 MG tablet Take 25 mg by  mouth every 8 (eight) hours as needed for nausea or vomiting.     timolol (TIMOPTIC) 0.5 % ophthalmic solution Place 1 drop into both eyes daily.     Travoprost, BAK Free, (TRAVATAN) 0.004 % SOLN ophthalmic solution Place 1 drop into both eyes at bedtime.     traZODone  (DESYREL ) 50 MG tablet Take 1.5 tablets (75 mg total) by mouth at bedtime. 135  tablet 1   Turmeric (QC TUMERIC COMPLEX PO) Take 1 capsule by mouth daily at 6 (six) AM.     ursodiol (ACTIGALL) 300 MG capsule Take 300 mg by mouth daily.     vitamin C (ASCORBIC ACID) 250 MG tablet Take 250 mg by mouth daily.     No current facility-administered medications for this visit.    (Not in a hospital admission)   Family History  Problem Relation Age of Onset   Heart failure Mother    Liver cancer Father    Cancer Brother    Heart failure Maternal Grandmother    Aneurysm Maternal Grandmother        ruptured abdominal aortic aneurysm     Review of Systems:   Review of Systems  Constitutional:  Positive for malaise/fatigue and weight loss.  Respiratory:  Positive for shortness of breath. Negative for cough.   Cardiovascular:  Positive for palpitations. Negative for chest pain, claudication and leg swelling.  Gastrointestinal:  Positive for diarrhea. Negative for nausea and vomiting.  Neurological:  Negative for dizziness and headaches.      Physical Exam: BP 124/73 (BP Location: Right Arm, Patient Position: Sitting, Cuff Size: Normal)   Pulse 65   Resp 20   Ht 5' 2 (1.575 m)   Wt 111 lb (50.3 kg)   LMP 03/18/1972 (Approximate)   SpO2 99% Comment: RA  BMI 20.30 kg/m  Physical Exam Constitutional:      Appearance: Normal appearance. She is normal weight.  HENT:     Head: Normocephalic and atraumatic.  Cardiovascular:     Rate and Rhythm: Normal rate and regular rhythm.     Heart sounds: Murmur heard.  Pulmonary:     Effort: Pulmonary effort is normal.     Breath sounds: Normal breath sounds.  Abdominal:      General: There is no distension.     Palpations: Abdomen is soft.     Tenderness: There is no abdominal tenderness.  Musculoskeletal:     Right lower leg: No edema.     Left lower leg: No edema.  Skin:    General: Skin is warm and dry.  Neurological:     General: No focal deficit present.     Mental Status: She is alert and oriented to person, place, and time.       Cardiac Studies & Procedures   ______________________________________________________________________________________________ CARDIAC CATHETERIZATION  CARDIAC CATHETERIZATION 06/27/2024  Conclusion No angiographic evidence of CAD Normal right heart pressures  Will continue workup for TAVR.  Findings Coronary Findings Diagnostic  Dominance: Right  Left Anterior Descending Vessel is large.  Left Circumflex Vessel is large.  Right Coronary Artery Vessel is large.  Intervention  No interventions have been documented.     ECHOCARDIOGRAM  ECHOCARDIOGRAM COMPLETE 04/17/2024  Narrative ECHOCARDIOGRAM REPORT    Patient Name:   KELIN NIXON Date of Exam: 04/17/2024 Medical Rec #:  [id number]        Height:       62.0 in Accession #:    [id number]       Weight:       114.2 lb Date of Birth:  [birth year]         BSA:          1.507 m Patient Age:    77 years         BP:           100/58 mmHg Patient Gender: F  HR:           54 bpm. Exam Location:  [address]  Procedure: 2D Echo, Cardiac Doppler and Color Doppler (Both Spectral and Color Flow Doppler were utilized during procedure).  Indications:    I35.0 Aortic Stenosis  History:        Patient has prior history of Echocardiogram examinations, most recent 08/31/2021. CAD and Previous Myocardial Infarction; Risk Factors:Hypertension and Dyslipidemia.  Sonographer:    Carl Rodgers-Jones RDCS Referring Phys: [id number] Kimberly Stone  IMPRESSIONS   1. Left ventricular ejection fraction, by estimation, is 55 to 60%. The left  ventricle has normal function. The left ventricle has no regional wall motion abnormalities. Left ventricular diastolic parameters are consistent with Grade II diastolic dysfunction (pseudonormalization). Elevated left atrial pressure. The E/e' is 16. 2. Right ventricular systolic function is normal. The right ventricular size is normal. Tricuspid regurgitation signal is inadequate for assessing PA pressure. 3. Left atrial size was moderately dilated. 4. The mitral valve is degenerative. Mild mitral valve regurgitation. No evidence of mitral stenosis. 5. Native aortic valve, trileaflet, calcified leaflets and cups, reduced excursion, moderate to severe aortic stenosis (peak velocity at RSB 3.36m/s, MG 22 mmHG, AVA (VTI) 0.85cm2, DI 0.24), mild to moderate aortic regurgitation. 6. The inferior vena cava is normal in size with greater than 50% respiratory variability, suggesting right atrial pressure of 3 mmHg.  Comparison(s): A prior study was performed on 08/31/2021. Reported LVEF at 60-65%, mild MR, mild AS, normal diastolic function.  FINDINGS Left Ventricle: Left ventricular ejection fraction, by estimation, is 55 to 60%. The left ventricle has normal function. The left ventricle has no regional wall motion abnormalities. The left ventricular internal cavity size was normal in size. There is no left ventricular hypertrophy. Abnormal (paradoxical) septal motion, consistent with left bundle branch block. Left ventricular diastolic parameters are consistent with Grade II diastolic dysfunction (pseudonormalization). Elevated left atrial pressure. The E/e' is 16.  Right Ventricle: The right ventricular size is normal. No increase in right ventricular wall thickness. Right ventricular systolic function is normal. Tricuspid regurgitation signal is inadequate for assessing PA pressure.  Left Atrium: Left atrial size was moderately dilated.  Right Atrium: Right atrial size was normal in  size.  Pericardium: There is no evidence of pericardial effusion.  Mitral Valve: The mitral valve is degenerative in appearance. There is mild thickening of the mitral valve leaflet(s). Normal mobility of the mitral valve leaflets. Mild mitral annular calcification. Mild mitral valve regurgitation. No evidence of mitral valve stenosis.  Tricuspid Valve: The tricuspid valve is grossly normal. Tricuspid valve regurgitation is not demonstrated. No evidence of tricuspid stenosis.  Native aortic valve, trileaflet, calcified leaflets and cups, reduced excursion, moderate to severe aortic stenosis (peak velocity at RSB 3.56m/s, MG 22 mmHG, AVA (VTI) 0.85cm2, DI 0.24), mild to moderate aortic regurgitation. Pulmonic Valve: The pulmonic valve was normal in structure. Pulmonic valve regurgitation is trivial. No evidence of pulmonic stenosis.  Aorta: The aortic root and ascending aorta are structurally normal, with no evidence of dilitation.  Venous: The inferior vena cava is normal in size with greater than 50% respiratory variability, suggesting right atrial pressure of 3 mmHg.  IAS/Shunts: There is redundancy of the interatrial septum. The atrial septum is grossly normal.   LEFT VENTRICLE PLAX 2D LVIDd:         4.70 cm   Diastology LVIDs:         3.40 cm   LV e' medial:    4.57 cm/s  LV PW:         1.00 cm   LV E/e' medial:  18.5 LV IVS:        0.90 cm   LV e' lateral:   6.31 cm/s LVOT diam:     2.10 cm   LV E/e' lateral: 13.4 LV SV:         56 LV SV Index:   37 LVOT Area:     3.46 cm   RIGHT VENTRICLE             IVC RV Basal diam:  3.70 cm     IVC diam: 1.30 cm RV S prime:     12.60 cm/s TAPSE (M-mode): 1.3 cm  LEFT ATRIUM             Index        RIGHT ATRIUM           Index LA diam:        4.00 cm 2.66 cm/m   RA Area:     10.50 cm LA Vol (A2C):   61.1 ml 40.56 ml/m  RA Volume:   23.00 ml  15.27 ml/m LA Vol (A4C):   66.7 ml 44.27 ml/m LA Biplane Vol: 64.7 ml 42.95  ml/m AORTIC VALVE AV Area (Vmax):    0.97 cm AV Area (Vmean):   0.96 cm AV Area (VTI):     0.81 cm AV Vmax:           281.20 cm/s AV Vmean:          197.600 cm/s AV VTI:            0.687 m AV Peak Grad:      31.6 mmHg AV Mean Grad:      18.4 mmHg LVOT Vmax:         78.35 cm/s LVOT Vmean:        54.500 cm/s LVOT VTI:          0.162 m LVOT/AV VTI ratio: 0.24 AI PHT:            495 msec  AORTA Ao Root diam: 3.20 cm Ao Asc diam:  3.60 cm  MITRAL VALVE MV Area (PHT): 3.21 cm    SHUNTS MV Decel Time: 236 msec    Systemic VTI:  0.16 m MV E velocity: 84.40 cm/s  Systemic Diam: 2.10 cm MV A velocity: 97.10 cm/s MV E/A ratio:  0.87  Sunit Tolia Electronically signed by Madonna Large Signature Date/Time: 04/17/2024/5:02:07 PM    Final      CT SCANS  CT CORONARY MORPH W/CTA COR W/SCORE 08/07/2024  Addendum 08/14/2024  7:47 PM ADDENDUM REPORT: 08/14/2024 19:45  EXAM: OVER-READ INTERPRETATION  CT CHEST  The following report is an over-read performed by radiologist Dr. Fonda Mom [facility] on 08/14/2024. This over-read does not include interpretation of cardiac or coronary anatomy or pathology. The coronary CTA interpretation by the cardiologist is attached.  COMPARISON:  08/07/2024.  FINDINGS: Cardiovascular:  See findings discussed in the body of the report.  Mediastinum/Nodes: No suspicious adenopathy identified. Imaged mediastinal structures are unremarkable.  Lungs/Pleura: Imaged lungs are clear. No pleural effusion or pneumothorax.  Upper Abdomen: No acute abnormality.  Musculoskeletal: No chest wall abnormality. No acute osseous findings.  IMPRESSION: No acute extracardiac incidental findings.   Electronically Signed By: Fonda Field M.D. On: 08/14/2024 19:45  Narrative CLINICAL DATA:  Aortic Stenosis  EXAM: Cardiac TAVR CT  TECHNIQUE: The patient was scanned on a  GE apex scanner. 1 beat acquisition triggered in the  descending thoracic aorta at 110 HU's. A non contrast, gated CT scan was obtained first with axial slices of 2.5 mm through the heart for valve scoring. A 120 kV retrospective, gated, contrast scan done with gantry rotation speed of 230 msec and collimation 0.63 mm. A delayed scan was obtained to exclude LAA thrombus. The 3D data set was reconstructed in 5% intervals of the R-R cycle. Best systolic phase was motion corrected Images were analyzed on a dedicated workstation using MPR, MIP and VRT modes The patient received 100 cc of contrast  FINDINGS: Aortic Valve: Calcified tri leaflet with score 656  Aorta: No aneurysm, mild calcific atherosclerosis normal arch vessels  Sino-tubular Junction: 25.2 mm  Ascending Thoracic Aorta: 32.4 mm  Aortic Arch: 30 mm  Descending Thoracic Aorta: 23.5 mm  Sinus of Valsalva Measurements:  Non-coronary: 32 mm Height 18.6 mm  Right - coronary: 29.1 mm  Height 19.4  Left -   coronary: 29 mm Height 18.3 mm  Coronary Artery Height above Annulus:  Left Main: 14.3 mm above annulus  Right Coronary: 18.1 mm above annulus  Virtual Basal Annulus Measurements:  Maximum / Minimum Diameter: 22.7 mm x 21.3 mm  Perimeter: 72.1 mm  Area: 406 mm2  Coronary Arteries: Sufficient height above annulus for deployment  Optimum Fluoroscopic Angle for Delivery: LAO 18 Caudal 10 degrees  Membranous septal length 9.4 mm  IMPRESSION: 1.  Tri leaflet AV with calcium  score 656  2. Annular area of 406 mm2 suitable for a 23 mm Sapien 3 valve. Alternatively a 26 mm Medtronic Evolut valve can be considered  3. Optimum angiographic angle for deployment LAO 18 Caudal 10 degrees  4.  Membranous septal length 9.4 mm  5.  Coronary arteries sufficient height above annulus for deployment  Maude Emmer  Electronically Signed: By: Maude Emmer M.D. On: 08/07/2024 11:42      ______________________________________________________________________________________________      ECG NSR, LBBB    I have independently reviewed the above radiologic studies and discussed with the patient  My measurements:  Annulus 400, RCA 18, LCA 13, SOV 29-30.9.  Transfemoral access looks good on both sides  Recent Lab Findings: Lab Results  Component Value Date   WBC 6.0 08/08/2024   HGB 12.3 08/08/2024   HCT 37.5 08/08/2024   PLT 282 08/08/2024   GLUCOSE 117 (H) 08/05/2024   CHOL 165 01/04/2014   TRIG 86 01/04/2014   HDL 79 01/04/2014   LDLCALC 69 01/04/2014   ALT 46 06/13/2015   AST 27 06/13/2015   NA 141 08/05/2024   K 4.0 08/05/2024   CL 104 08/05/2024   CREATININE 1.07 (H) 08/05/2024   BUN 22 08/05/2024   CO2 16 (L) 08/05/2024   INR 1.05 06/09/2015   HGBA1C 5.8 (H) 01/03/2014      Assessment / Plan:   78 y.o. female with severe LFLG aortic stenosis and moderate AI.  She is in good health and very active.  STS score: 3.28.  NYHA Class II.  The risks and benefits of transfemoral TAVR were discussed in detail.  The risks included death, stroke, paravalvular leak, aortic dissection, annulus rupture, device embolization, acute myocardial infarction, arrhythmia, heart block or need for permanent pacemaker.  We also discussed possibility of an emergent sternotomy to address any procedural complications.  Based on our discussion, we collectively decided that an emergent sternotomy would be indicated.  The patient is agreeable to proceed.  Based  on my review of her LHC, echo, and CTA, I agree with the multidisciplinary plan to proceed with a transfemoral 23 mm S3 UR TAVR.  I  spent 30 minutes counseling the patient face to face.   Con RAMAN Jonika Critz 09/04/2024 12:09 PM

## 2024-09-04 NOTE — H&P (View-Only) (Signed)
 [facility]   MULTIDISCIPLINARY HEART VALVE CLINIC    Kimberly Stone [facility] Medical Record #[id number] Date of Birth: [birth year]  Referring: Francyne Headland, MD Primary Care: Shayne Anes, MD Primary Cardiologist:Mihai Croitoru, MD  Chief Complaint:    Chief Complaint  Patient presents with   Aortic Stenosis    TAVR consult, review all testing    History of Present Illness:     Kimberly Stone is a 78 y.o. female presents for evaluation of severe aortic stenosis and TAVR.  She has a history of coronary artery disease and NSTEMI in 2005 not amenable to PCI.  She also has a history of left bundle branch block, hypertension, hyperlipidemia, low-flow low gradient severe aortic stenosis with moderate AI.  She also has a history of gastric bypass surgery.  She recently was having diarrhea and found to have pancreatic insufficiency, she is now on Creon which has helped significantly. Her weight previously was 125 and she is now down to 104-106 lbs.  Her symptoms include dyspnea with exertion, fatigue, and palpitations.  The palpitations resolved with rest.  She denies dizziness or chest pain.  She notes that her dyspnea is the worst when she is working around the house, working outside, climbing stairs, or even walking short distances.  She denies history of stroke or renal issues.  She sees a Education officer, community twice yearly.  Denies any dental issues.  She has an appointment with the dentist next Monday.  Past Medical History:  Diagnosis Date   Acute MI (HCC) 11/2004   NONSTEMI 2005 NOINTERVENTION PER NOTE    Anemia    from bypass- iron and B12   Anxiety    Aortic stenosis    Arthritis    Coronary artery disease    DDD (degenerative disc disease), cervical    Depression    DJD (degenerative joint disease)    Fibromyalgia    GERD (gastroesophageal reflux disease)    GIB (gastrointestinal bleeding)    while on celecoxib post bypass   Hyperlipidemia    Hyperlipidemia LDL  goal < 130 01/23/2014   Hypertension    Obesity    hx of bipass, now resolved.  borderline diabetes also resolved.   Osteopenia    Post-menopausal    SCCA (squamous cell carcinoma) of skin 09/21/2015   left forearm medial cx3,68fu   SCCA (squamous cell carcinoma) of skin 07/26/2016   bridge of nose cx3 58fu   SCCA (squamous cell carcinoma) of skin 02/10/2020   right inner cheek tx after biopsy   SCCA (squamous cell carcinoma) of skin 02/10/2020   left hand tx after biopsy   Scoliosis    Sinusitis    Statin intolerance 01/23/2014    Past Surgical History:  Procedure Laterality Date   CARDIAC CATHETERIZATION     2005 Severely disease small optional diagonal vessel responsible far wall motion abnormalities wise to small and diffusely diseased for consideration of interventio.   CATARACT EXTRACTION     COLONOSCOPY     GASTRIC BYPASS  01/2004   KNEE ARTHROSCOPY Right    LAPAROSCOPIC CHOLECYSTECTOMY  1993   RIGHT HEART CATH AND CORONARY ANGIOGRAPHY N/A 06/27/2024   Procedure: RIGHT HEART CATH AND CORONARY ANGIOGRAPHY;  Surgeon: Verlin Lonni BIRCH, MD;  Location: [facility] INVASIVE CV LAB;  Service: Cardiovascular;  Laterality: N/A;   TOTAL VAGINAL HYSTERECTOMY  1973   secondary to DUB - Dr. Elsa and Dr. Claudene    Social History:  Social History   Tobacco  Use  Smoking Status Former   Current packs/day: 0.00   Average packs/day: 0.3 packs/day for 5.0 years (1.3 ttl pk-yrs)   Types: Cigarettes   Start date: 64   Quit date: 12/18/1996   Years since quitting: 27.7  Smokeless Tobacco Never    Social History   Substance and Sexual Activity  Alcohol  Use Yes   Alcohol /week: 1.0 - 2.0 standard drink of alcohol    Types: 1 - 2 Glasses of wine per week   Comment: rare wine     Allergies  Allergen Reactions   Aspirin  Other (See Comments)    PT CANNOT TAKE DUE TO HX OF GASTRIC BYPASS SURGERY    Loperamide Palpitations and Other (See Comments)   Statins     Joint pain    Atorvastatin      elevated LFTs   Beta Adrenergic Blockers     dyspnea   Celecoxib     UGI bleed   Desvenlafaxine     racing, twitching, everything going too fast.   Ezetimibe      liver panel went up   Nsaids     Other Reaction(s): prior GI bleed   Rosuvastatin     Other Reaction(s): elevated LFTs   Simvastatin     Other Reaction(s): elevated LF's, elevated LFTs, muscle problems   Erythromycin Other (See Comments)    UNKNOWN      Current Outpatient Medications  Medication Sig Dispense Refill   acetaminophen  (TYLENOL ) 650 MG CR tablet Take 1,300 mg by mouth 2 (two) times daily as needed for pain.     amLODipine  (NORVASC ) 5 MG tablet Take 5 mg by mouth daily.     buPROPion  (WELLBUTRIN  XL) 300 MG 24 hr tablet Take 1 tablet (300 mg total) by mouth daily. 90 tablet 1   Cholecalciferol (VITAMIN D3 ULTRA STRENGTH) 125 MCG (5000 UT) capsule Take 5,000 Units by mouth daily.     clopidogrel  (PLAVIX ) 75 MG tablet TAKE 1 TABLET (75 MG TOTAL) BY MOUTH DAILY. PLEASE SCHEDULE APPOINTMENT FOR REFILLS. 7 tablet 0   diphenoxylate-atropine (LOMOTIL) 2.5-0.025 MG tablet Take 1 tablet by mouth 4 (four) times daily as needed.     DULoxetine  (CYMBALTA ) 60 MG capsule Take 1 capsule (60 mg total) by mouth 2 (two) times daily. 180 capsule 1   Evolocumab (REPATHA) 140 MG/ML SOSY Inject 140 mg into the skin every 21 ( twenty-one) days.     furosemide  (LASIX ) 20 MG tablet Take 1 tablet (20 mg total) by mouth daily. Be sure to eat potassium rich foods while taking Furosemide . 90 tablet 3   meclizine (ANTIVERT) 25 MG tablet Take 25 mg by mouth 3 (three) times daily as needed.     methocarbamol  (ROBAXIN ) 500 MG tablet Take 500 mg by mouth every 8 (eight) hours as needed for muscle spasms.     Multiple Vitamin (MULTIVITAMIN WITH MINERALS) TABS Take 2 tablets by mouth daily.     pantoprazole  (PROTONIX ) 40 MG tablet Take 40 mg by mouth 2 (two) times daily.     promethazine  (PHENERGAN ) 25 MG tablet Take 25 mg by  mouth every 8 (eight) hours as needed for nausea or vomiting.     timolol (TIMOPTIC) 0.5 % ophthalmic solution Place 1 drop into both eyes daily.     Travoprost, BAK Free, (TRAVATAN) 0.004 % SOLN ophthalmic solution Place 1 drop into both eyes at bedtime.     traZODone  (DESYREL ) 50 MG tablet Take 1.5 tablets (75 mg total) by mouth at bedtime. 135  tablet 1   Turmeric (QC TUMERIC COMPLEX PO) Take 1 capsule by mouth daily at 6 (six) AM.     ursodiol (ACTIGALL) 300 MG capsule Take 300 mg by mouth daily.     vitamin C (ASCORBIC ACID) 250 MG tablet Take 250 mg by mouth daily.     No current facility-administered medications for this visit.    (Not in a hospital admission)   Family History  Problem Relation Age of Onset   Heart failure Mother    Liver cancer Father    Cancer Brother    Heart failure Maternal Grandmother    Aneurysm Maternal Grandmother        ruptured abdominal aortic aneurysm     Review of Systems:   Review of Systems  Constitutional:  Positive for malaise/fatigue and weight loss.  Respiratory:  Positive for shortness of breath. Negative for cough.   Cardiovascular:  Positive for palpitations. Negative for chest pain, claudication and leg swelling.  Gastrointestinal:  Positive for diarrhea. Negative for nausea and vomiting.  Neurological:  Negative for dizziness and headaches.      Physical Exam: BP 124/73 (BP Location: Right Arm, Patient Position: Sitting, Cuff Size: Normal)   Pulse 65   Resp 20   Ht 5' 2 (1.575 m)   Wt 111 lb (50.3 kg)   LMP 03/18/1972 (Approximate)   SpO2 99% Comment: RA  BMI 20.30 kg/m  Physical Exam Constitutional:      Appearance: Normal appearance. She is normal weight.  HENT:     Head: Normocephalic and atraumatic.  Cardiovascular:     Rate and Rhythm: Normal rate and regular rhythm.     Heart sounds: Murmur heard.  Pulmonary:     Effort: Pulmonary effort is normal.     Breath sounds: Normal breath sounds.  Abdominal:      General: There is no distension.     Palpations: Abdomen is soft.     Tenderness: There is no abdominal tenderness.  Musculoskeletal:     Right lower leg: No edema.     Left lower leg: No edema.  Skin:    General: Skin is warm and dry.  Neurological:     General: No focal deficit present.     Mental Status: She is alert and oriented to person, place, and time.       Cardiac Studies & Procedures   ______________________________________________________________________________________________ CARDIAC CATHETERIZATION  CARDIAC CATHETERIZATION 06/27/2024  Conclusion No angiographic evidence of CAD Normal right heart pressures  Will continue workup for TAVR.  Findings Coronary Findings Diagnostic  Dominance: Right  Left Anterior Descending Vessel is large.  Left Circumflex Vessel is large.  Right Coronary Artery Vessel is large.  Intervention  No interventions have been documented.     ECHOCARDIOGRAM  ECHOCARDIOGRAM COMPLETE 04/17/2024  Narrative ECHOCARDIOGRAM REPORT    Patient Name:   Kimberly Stone Date of Exam: 04/17/2024 Medical Rec #:  [id number]        Height:       62.0 in Accession #:    [id number]       Weight:       114.2 lb Date of Birth:  [birth year]         BSA:          1.507 m Patient Age:    77 years         BP:           100/58 mmHg Patient Gender: F  HR:           54 bpm. Exam Location:  [address]  Procedure: 2D Echo, Cardiac Doppler and Color Doppler (Both Spectral and Color Flow Doppler were utilized during procedure).  Indications:    I35.0 Aortic Stenosis  History:        Patient has prior history of Echocardiogram examinations, most recent 08/31/2021. CAD and Previous Myocardial Infarction; Risk Factors:Hypertension and Dyslipidemia.  Sonographer:    Carl Rodgers-Jones RDCS Referring Phys: [id number] MIHAI CROITORU  IMPRESSIONS   1. Left ventricular ejection fraction, by estimation, is 55 to 60%. The left  ventricle has normal function. The left ventricle has no regional wall motion abnormalities. Left ventricular diastolic parameters are consistent with Grade II diastolic dysfunction (pseudonormalization). Elevated left atrial pressure. The E/e' is 16. 2. Right ventricular systolic function is normal. The right ventricular size is normal. Tricuspid regurgitation signal is inadequate for assessing PA pressure. 3. Left atrial size was moderately dilated. 4. The mitral valve is degenerative. Mild mitral valve regurgitation. No evidence of mitral stenosis. 5. Native aortic valve, trileaflet, calcified leaflets and cups, reduced excursion, moderate to severe aortic stenosis (peak velocity at RSB 3.36m/s, MG 22 mmHG, AVA (VTI) 0.85cm2, DI 0.24), mild to moderate aortic regurgitation. 6. The inferior vena cava is normal in size with greater than 50% respiratory variability, suggesting right atrial pressure of 3 mmHg.  Comparison(s): A prior study was performed on 08/31/2021. Reported LVEF at 60-65%, mild MR, mild AS, normal diastolic function.  FINDINGS Left Ventricle: Left ventricular ejection fraction, by estimation, is 55 to 60%. The left ventricle has normal function. The left ventricle has no regional wall motion abnormalities. The left ventricular internal cavity size was normal in size. There is no left ventricular hypertrophy. Abnormal (paradoxical) septal motion, consistent with left bundle branch block. Left ventricular diastolic parameters are consistent with Grade II diastolic dysfunction (pseudonormalization). Elevated left atrial pressure. The E/e' is 16.  Right Ventricle: The right ventricular size is normal. No increase in right ventricular wall thickness. Right ventricular systolic function is normal. Tricuspid regurgitation signal is inadequate for assessing PA pressure.  Left Atrium: Left atrial size was moderately dilated.  Right Atrium: Right atrial size was normal in  size.  Pericardium: There is no evidence of pericardial effusion.  Mitral Valve: The mitral valve is degenerative in appearance. There is mild thickening of the mitral valve leaflet(s). Normal mobility of the mitral valve leaflets. Mild mitral annular calcification. Mild mitral valve regurgitation. No evidence of mitral valve stenosis.  Tricuspid Valve: The tricuspid valve is grossly normal. Tricuspid valve regurgitation is not demonstrated. No evidence of tricuspid stenosis.  Native aortic valve, trileaflet, calcified leaflets and cups, reduced excursion, moderate to severe aortic stenosis (peak velocity at RSB 3.56m/s, MG 22 mmHG, AVA (VTI) 0.85cm2, DI 0.24), mild to moderate aortic regurgitation. Pulmonic Valve: The pulmonic valve was normal in structure. Pulmonic valve regurgitation is trivial. No evidence of pulmonic stenosis.  Aorta: The aortic root and ascending aorta are structurally normal, with no evidence of dilitation.  Venous: The inferior vena cava is normal in size with greater than 50% respiratory variability, suggesting right atrial pressure of 3 mmHg.  IAS/Shunts: There is redundancy of the interatrial septum. The atrial septum is grossly normal.   LEFT VENTRICLE PLAX 2D LVIDd:         4.70 cm   Diastology LVIDs:         3.40 cm   LV e' medial:    4.57 cm/s  LV PW:         1.00 cm   LV E/e' medial:  18.5 LV IVS:        0.90 cm   LV e' lateral:   6.31 cm/s LVOT diam:     2.10 cm   LV E/e' lateral: 13.4 LV SV:         56 LV SV Index:   37 LVOT Area:     3.46 cm   RIGHT VENTRICLE             IVC RV Basal diam:  3.70 cm     IVC diam: 1.30 cm RV S prime:     12.60 cm/s TAPSE (M-mode): 1.3 cm  LEFT ATRIUM             Index        RIGHT ATRIUM           Index LA diam:        4.00 cm 2.66 cm/m   RA Area:     10.50 cm LA Vol (A2C):   61.1 ml 40.56 ml/m  RA Volume:   23.00 ml  15.27 ml/m LA Vol (A4C):   66.7 ml 44.27 ml/m LA Biplane Vol: 64.7 ml 42.95  ml/m AORTIC VALVE AV Area (Vmax):    0.97 cm AV Area (Vmean):   0.96 cm AV Area (VTI):     0.81 cm AV Vmax:           281.20 cm/s AV Vmean:          197.600 cm/s AV VTI:            0.687 m AV Peak Grad:      31.6 mmHg AV Mean Grad:      18.4 mmHg LVOT Vmax:         78.35 cm/s LVOT Vmean:        54.500 cm/s LVOT VTI:          0.162 m LVOT/AV VTI ratio: 0.24 AI PHT:            495 msec  AORTA Ao Root diam: 3.20 cm Ao Asc diam:  3.60 cm  MITRAL VALVE MV Area (PHT): 3.21 cm    SHUNTS MV Decel Time: 236 msec    Systemic VTI:  0.16 m MV E velocity: 84.40 cm/s  Systemic Diam: 2.10 cm MV A velocity: 97.10 cm/s MV E/A ratio:  0.87  Sunit Tolia Electronically signed by Madonna Large Signature Date/Time: 04/17/2024/5:02:07 PM    Final      CT SCANS  CT CORONARY MORPH W/CTA COR W/SCORE 08/07/2024  Addendum 08/14/2024  7:47 PM ADDENDUM REPORT: 08/14/2024 19:45  EXAM: OVER-READ INTERPRETATION  CT CHEST  The following report is an over-read performed by radiologist Dr. Fonda Mom [facility] on 08/14/2024. This over-read does not include interpretation of cardiac or coronary anatomy or pathology. The coronary CTA interpretation by the cardiologist is attached.  COMPARISON:  08/07/2024.  FINDINGS: Cardiovascular:  See findings discussed in the body of the report.  Mediastinum/Nodes: No suspicious adenopathy identified. Imaged mediastinal structures are unremarkable.  Lungs/Pleura: Imaged lungs are clear. No pleural effusion or pneumothorax.  Upper Abdomen: No acute abnormality.  Musculoskeletal: No chest wall abnormality. No acute osseous findings.  IMPRESSION: No acute extracardiac incidental findings.   Electronically Signed By: Fonda Field M.D. On: 08/14/2024 19:45  Narrative CLINICAL DATA:  Aortic Stenosis  EXAM: Cardiac TAVR CT  TECHNIQUE: The patient was scanned on a  GE apex scanner. 1 beat acquisition triggered in the  descending thoracic aorta at 110 HU's. A non contrast, gated CT scan was obtained first with axial slices of 2.5 mm through the heart for valve scoring. A 120 kV retrospective, gated, contrast scan done with gantry rotation speed of 230 msec and collimation 0.63 mm. A delayed scan was obtained to exclude LAA thrombus. The 3D data set was reconstructed in 5% intervals of the R-R cycle. Best systolic phase was motion corrected Images were analyzed on a dedicated workstation using MPR, MIP and VRT modes The patient received 100 cc of contrast  FINDINGS: Aortic Valve: Calcified tri leaflet with score 656  Aorta: No aneurysm, mild calcific atherosclerosis normal arch vessels  Sino-tubular Junction: 25.2 mm  Ascending Thoracic Aorta: 32.4 mm  Aortic Arch: 30 mm  Descending Thoracic Aorta: 23.5 mm  Sinus of Valsalva Measurements:  Non-coronary: 32 mm Height 18.6 mm  Right - coronary: 29.1 mm  Height 19.4  Left -   coronary: 29 mm Height 18.3 mm  Coronary Artery Height above Annulus:  Left Main: 14.3 mm above annulus  Right Coronary: 18.1 mm above annulus  Virtual Basal Annulus Measurements:  Maximum / Minimum Diameter: 22.7 mm x 21.3 mm  Perimeter: 72.1 mm  Area: 406 mm2  Coronary Arteries: Sufficient height above annulus for deployment  Optimum Fluoroscopic Angle for Delivery: LAO 18 Caudal 10 degrees  Membranous septal length 9.4 mm  IMPRESSION: 1.  Tri leaflet AV with calcium  score 656  2. Annular area of 406 mm2 suitable for a 23 mm Sapien 3 valve. Alternatively a 26 mm Medtronic Evolut valve can be considered  3. Optimum angiographic angle for deployment LAO 18 Caudal 10 degrees  4.  Membranous septal length 9.4 mm  5.  Coronary arteries sufficient height above annulus for deployment  Maude Emmer  Electronically Signed: By: Maude Emmer M.D. On: 08/07/2024 11:42      ______________________________________________________________________________________________      ECG NSR, LBBB    I have independently reviewed the above radiologic studies and discussed with the patient  My measurements:  Annulus 400, RCA 18, LCA 13, SOV 29-30.9.  Transfemoral access looks good on both sides  Recent Lab Findings: Lab Results  Component Value Date   WBC 6.0 08/08/2024   HGB 12.3 08/08/2024   HCT 37.5 08/08/2024   PLT 282 08/08/2024   GLUCOSE 117 (H) 08/05/2024   CHOL 165 01/04/2014   TRIG 86 01/04/2014   HDL 79 01/04/2014   LDLCALC 69 01/04/2014   ALT 46 06/13/2015   AST 27 06/13/2015   NA 141 08/05/2024   K 4.0 08/05/2024   CL 104 08/05/2024   CREATININE 1.07 (H) 08/05/2024   BUN 22 08/05/2024   CO2 16 (L) 08/05/2024   INR 1.05 06/09/2015   HGBA1C 5.8 (H) 01/03/2014      Assessment / Plan:   78 y.o. female with severe LFLG aortic stenosis and moderate AI.  She is in good health and very active.  STS score: 3.28.  NYHA Class II.  The risks and benefits of transfemoral TAVR were discussed in detail.  The risks included death, stroke, paravalvular leak, aortic dissection, annulus rupture, device embolization, acute myocardial infarction, arrhythmia, heart block or need for permanent pacemaker.  We also discussed possibility of an emergent sternotomy to address any procedural complications.  Based on our discussion, we collectively decided that an emergent sternotomy would be indicated.  The patient is agreeable to proceed.  Based  on my review of her LHC, echo, and CTA, I agree with the multidisciplinary plan to proceed with a transfemoral 23 mm S3 UR TAVR.  I  spent 30 minutes counseling the patient face to face.   Con RAMAN Jonika Critz 09/04/2024 12:09 PM

## 2024-09-05 ENCOUNTER — Other Ambulatory Visit: Payer: Self-pay

## 2024-09-05 DIAGNOSIS — I350 Nonrheumatic aortic (valve) stenosis: Secondary | ICD-10-CM

## 2024-09-19 ENCOUNTER — Ambulatory Visit: Payer: Self-pay | Admitting: Cardiovascular Disease

## 2024-09-19 ENCOUNTER — Other Ambulatory Visit: Payer: Self-pay

## 2024-09-19 ENCOUNTER — Ambulatory Visit (HOSPITAL_COMMUNITY)
Admission: RE | Admit: 2024-09-19 | Discharge: 2024-09-19 | Disposition: A | Discharge status: Home / Self Care | Source: Ambulatory Visit | Attending: Cardiovascular Disease | Admitting: Cardiovascular Disease

## 2024-09-19 ENCOUNTER — Encounter (HOSPITAL_COMMUNITY)
Admission: RE | Admit: 2024-09-19 | Discharge: 2024-09-19 | Disposition: A | Discharge status: Home / Self Care | Source: Ambulatory Visit | Attending: Cardiovascular Disease | Admitting: Cardiovascular Disease

## 2024-09-19 DIAGNOSIS — Z01818 Encounter for other preprocedural examination: Secondary | ICD-10-CM | POA: Diagnosis not present

## 2024-09-19 DIAGNOSIS — I350 Nonrheumatic aortic (valve) stenosis: Secondary | ICD-10-CM | POA: Diagnosis not present

## 2024-09-19 DIAGNOSIS — I700 Atherosclerosis of aorta: Secondary | ICD-10-CM | POA: Diagnosis not present

## 2024-09-19 LAB — COMPREHENSIVE METABOLIC PANEL WITH GFR
ALT: 16 U/L (ref 0–44)
AST: 19 U/L (ref 15–41)
Albumin: 3.5 g/dL (ref 3.5–5.0)
Alkaline Phosphatase: 132 U/L — ABNORMAL HIGH (ref 38–126)
Anion gap: 10 (ref 5–15)
BUN: 15 mg/dL (ref 8–23)
CO2: 27 mmol/L (ref 22–32)
Calcium: 8.9 mg/dL (ref 8.9–10.3)
Chloride: 101 mmol/L (ref 98–111)
Creatinine, Ser: 1 mg/dL (ref 0.44–1.00)
GFR, Estimated: 58 mL/min — ABNORMAL LOW (ref >60)
Glucose, Bld: 94 mg/dL (ref 70–99)
Potassium: 4.7 mmol/L (ref 3.5–5.1)
Sodium: 138 mmol/L (ref 135–145)
Total Bilirubin: 0.8 mg/dL (ref 0.0–1.2)
Total Protein: 6.7 g/dL (ref 6.5–8.1)

## 2024-09-19 LAB — CBC
HCT: 39.2 % (ref 36.0–46.0)
Hemoglobin: 12.6 g/dL (ref 12.0–15.0)
MCH: 31.3 pg (ref 26.0–34.0)
MCHC: 32.1 g/dL (ref 30.0–36.0)
MCV: 97.3 fL (ref 80.0–100.0)
Platelets: 346 K/uL (ref 150–400)
RBC: 4.03 MIL/uL (ref 3.87–5.11)
RDW: 12.7 % (ref 11.5–15.5)
WBC: 7.7 K/uL (ref 4.0–10.5)
nRBC: 0 % (ref 0.0–0.2)

## 2024-09-19 LAB — PROTIME-INR
INR: 1 (ref 0.8–1.2)
Prothrombin Time: 13.3 s (ref 11.4–15.2)

## 2024-09-19 LAB — URINALYSIS, ROUTINE W REFLEX MICROSCOPIC
Bilirubin Urine: NEGATIVE
Glucose, UA: NEGATIVE mg/dL
Hgb urine dipstick: NEGATIVE
Ketones, ur: NEGATIVE mg/dL
Nitrite: NEGATIVE
Protein, ur: NEGATIVE mg/dL
Specific Gravity, Urine: 1.023 (ref 1.005–1.030)
pH: 5 (ref 5.0–8.0)

## 2024-09-19 LAB — TYPE AND SCREEN
ABO/RH(D): O POS
Antibody Screen: NEGATIVE

## 2024-09-19 LAB — SURGICAL PCR SCREEN
MRSA, PCR: NEGATIVE
Staphylococcus aureus: NEGATIVE

## 2024-09-19 NOTE — Progress Notes (Signed)
 All consents signed by patient at PAT lab appointment. Pt was sent home with printed copy of surgical instructions and CHG soap/CHG soap instructions. All instructions reviewed with patient and questions answered.  Patients chart send to anesthesia for review. Pt denies any respiratory illness/infection in the last two months.

## 2024-09-22 MED ORDER — NOREPINEPHRINE 4 MG/250ML-% IV SOLN
0.0000 ug/min | INTRAVENOUS | Status: AC
  Administered 2024-09-23: 3 ug/min via INTRAVENOUS
  Filled 2024-09-22: qty 250

## 2024-09-22 MED ORDER — CEFAZOLIN SODIUM-DEXTROSE 2-4 GM/100ML-% IV SOLN
2.0000 g | INTRAVENOUS | Status: AC
  Administered 2024-09-23: 2 g via INTRAVENOUS
  Filled 2024-09-22: qty 100

## 2024-09-22 MED ORDER — MAGNESIUM SULFATE 50 % IJ SOLN
40.0000 meq | INTRAMUSCULAR | Status: DC
  Filled 2024-09-22: qty 9.85

## 2024-09-22 MED ORDER — DEXMEDETOMIDINE HCL IN NACL 400 MCG/100ML IV SOLN
0.1000 ug/kg/h | INTRAVENOUS | Status: AC
  Administered 2024-09-23: 50.32 ug via INTRAVENOUS
  Administered 2024-09-23: 1 ug/kg/h via INTRAVENOUS
  Filled 2024-09-22: qty 100

## 2024-09-22 MED ORDER — POTASSIUM CHLORIDE 2 MEQ/ML IV SOLN
80.0000 meq | INTRAVENOUS | Status: DC
  Filled 2024-09-22: qty 40

## 2024-09-22 MED ORDER — HEPARIN 30,000 UNITS/1000 ML (OHS) CELLSAVER SOLUTION
Status: DC
  Filled 2024-09-22: qty 1000

## 2024-09-22 NOTE — Anesthesia Preprocedure Evaluation (Addendum)
 Anesthesia Evaluation  Patient identified by MRN, date of birth, ID band Patient awake    Reviewed: Allergy & Precautions, H&P , NPO status , Patient's Chart, lab work & pertinent test results  Airway Mallampati: II  TM Distance: >3 FB Neck ROM: Limited    Dental no notable dental hx. (+) Dental Advisory Given, Teeth Intact   Pulmonary shortness of breath, former smoker   Pulmonary exam normal breath sounds clear to auscultation       Cardiovascular hypertension, Pt. on medications + CAD and + Past MI  Normal cardiovascular exam+ dysrhythmias + Valvular Problems/Murmurs AS, AI and MR  Rhythm:Regular Rate:Normal  Echo 04/2024  1. Left ventricular ejection fraction, by estimation, is 55 to 60%. The  left ventricle has normal function. The left ventricle has no regional  wall motion abnormalities. Left ventricular diastolic parameters are  consistent with Grade II diastolic  dysfunction (pseudonormalization). Elevated left atrial pressure. The E/e'  is 16.   2. Right ventricular systolic function is normal. The right ventricular  size is normal. Tricuspid regurgitation signal is inadequate for assessing  PA pressure.   3. Left atrial size was moderately dilated.   4. The mitral valve is degenerative. Mild mitral valve regurgitation. No  evidence of mitral stenosis.   5. Native aortic valve, trileaflet, calcified leaflets and cups, reduced  excursion, moderate to severe aortic stenosis (peak velocity at RSB  3.11m/s, MG 22 mmHG, AVA (VTI) 0.85cm2, DI 0.24), mild to moderate aortic  regurgitation.   6. The inferior vena cava is normal in size with greater than 50%  respiratory variability, suggesting right atrial pressure of 3 mmHg.   Comparison(s): A prior study was performed on 08/31/2021. Reported LVEF at  60-65%, mild MR, mild AS, normal diastolic function.     Neuro/Psych  PSYCHIATRIC DISORDERS Anxiety Depression     negative neurological ROS     GI/Hepatic ,GERD  Medicated,,(+) Hepatitis -  Endo/Other  negative endocrine ROS    Renal/GU negative Renal ROS     Musculoskeletal  (+) Arthritis ,  Fibromyalgia -  Abdominal   Peds  Hematology  (+) Blood dyscrasia, anemia   Anesthesia Other Findings   Reproductive/Obstetrics                              Anesthesia Physical Anesthesia Plan  ASA: 4  Anesthesia Plan: MAC   Post-op Pain Management: Tylenol  PO (pre-op)*   Induction: Intravenous  PONV Risk Score and Plan: 2 and Ondansetron , Treatment may vary due to age or medical condition and TIVA  Airway Management Planned: Natural Airway  Additional Equipment:   Intra-op Plan:   Post-operative Plan:   Informed Consent: I have reviewed the patients History and Physical, chart, labs and discussed the procedure including the risks, benefits and alternatives for the proposed anesthesia with the patient or authorized representative who has indicated his/her understanding and acceptance.     Dental advisory given  Plan Discussed with: CRNA  Anesthesia Plan Comments:          Anesthesia Quick Evaluation

## 2024-09-23 ENCOUNTER — Other Ambulatory Visit: Payer: Self-pay

## 2024-09-23 ENCOUNTER — Inpatient Hospital Stay (HOSPITAL_COMMUNITY)

## 2024-09-23 ENCOUNTER — Inpatient Hospital Stay (HOSPITAL_COMMUNITY)
Admission: RE | Admit: 2024-09-23 | Discharge: 2024-09-24 | DRG: 267 | Disposition: A | Discharge status: Home / Self Care | Attending: Cardiovascular Disease | Admitting: Cardiovascular Disease

## 2024-09-23 ENCOUNTER — Encounter (HOSPITAL_COMMUNITY)
Admission: RE | Disposition: A | Discharge status: Home / Self Care | Payer: Self-pay | Source: Home / Self Care | Attending: Cardiovascular Disease

## 2024-09-23 ENCOUNTER — Inpatient Hospital Stay (HOSPITAL_COMMUNITY): Payer: Self-pay | Admitting: Certified Registered Nurse Anesthetist

## 2024-09-23 ENCOUNTER — Inpatient Hospital Stay (HOSPITAL_COMMUNITY): Payer: Self-pay | Admitting: Physician Assistant

## 2024-09-23 ENCOUNTER — Encounter (HOSPITAL_COMMUNITY): Payer: Self-pay | Admitting: Cardiovascular Disease

## 2024-09-23 DIAGNOSIS — M797 Fibromyalgia: Secondary | ICD-10-CM | POA: Diagnosis present

## 2024-09-23 DIAGNOSIS — M419 Scoliosis, unspecified: Secondary | ICD-10-CM | POA: Diagnosis present

## 2024-09-23 DIAGNOSIS — Z006 Encounter for examination for normal comparison and control in clinical research program: Secondary | ICD-10-CM

## 2024-09-23 DIAGNOSIS — I350 Nonrheumatic aortic (valve) stenosis: Secondary | ICD-10-CM

## 2024-09-23 DIAGNOSIS — Z7902 Long term (current) use of antithrombotics/antiplatelets: Secondary | ICD-10-CM | POA: Diagnosis not present

## 2024-09-23 DIAGNOSIS — Z800 Family history of malignant neoplasm of digestive organs: Secondary | ICD-10-CM | POA: Diagnosis not present

## 2024-09-23 DIAGNOSIS — Z8249 Family history of ischemic heart disease and other diseases of the circulatory system: Secondary | ICD-10-CM | POA: Diagnosis not present

## 2024-09-23 DIAGNOSIS — Z9049 Acquired absence of other specified parts of digestive tract: Secondary | ICD-10-CM

## 2024-09-23 DIAGNOSIS — M5030 Other cervical disc degeneration, unspecified cervical region: Secondary | ICD-10-CM | POA: Diagnosis present

## 2024-09-23 DIAGNOSIS — I10 Essential (primary) hypertension: Secondary | ICD-10-CM | POA: Diagnosis present

## 2024-09-23 DIAGNOSIS — Z888 Allergy status to other drugs, medicaments and biological substances status: Secondary | ICD-10-CM | POA: Diagnosis not present

## 2024-09-23 DIAGNOSIS — Z952 Presence of prosthetic heart valve: Secondary | ICD-10-CM

## 2024-09-23 DIAGNOSIS — Z90710 Acquired absence of both cervix and uterus: Secondary | ICD-10-CM

## 2024-09-23 DIAGNOSIS — I2510 Atherosclerotic heart disease of native coronary artery without angina pectoris: Secondary | ICD-10-CM

## 2024-09-23 DIAGNOSIS — Z789 Other specified health status: Secondary | ICD-10-CM | POA: Diagnosis present

## 2024-09-23 DIAGNOSIS — I252 Old myocardial infarction: Secondary | ICD-10-CM

## 2024-09-23 DIAGNOSIS — Z886 Allergy status to analgesic agent status: Secondary | ICD-10-CM

## 2024-09-23 DIAGNOSIS — E785 Hyperlipidemia, unspecified: Secondary | ICD-10-CM | POA: Diagnosis present

## 2024-09-23 DIAGNOSIS — E876 Hypokalemia: Secondary | ICD-10-CM | POA: Diagnosis present

## 2024-09-23 DIAGNOSIS — M8580 Other specified disorders of bone density and structure, unspecified site: Secondary | ICD-10-CM | POA: Diagnosis present

## 2024-09-23 DIAGNOSIS — Z87891 Personal history of nicotine dependence: Secondary | ICD-10-CM

## 2024-09-23 DIAGNOSIS — Z881 Allergy status to other antibiotic agents status: Secondary | ICD-10-CM

## 2024-09-23 DIAGNOSIS — Z85828 Personal history of other malignant neoplasm of skin: Secondary | ICD-10-CM

## 2024-09-23 DIAGNOSIS — Z9884 Bariatric surgery status: Secondary | ICD-10-CM | POA: Diagnosis not present

## 2024-09-23 DIAGNOSIS — I447 Left bundle-branch block, unspecified: Secondary | ICD-10-CM | POA: Diagnosis present

## 2024-09-23 DIAGNOSIS — Z79899 Other long term (current) drug therapy: Secondary | ICD-10-CM | POA: Diagnosis not present

## 2024-09-23 HISTORY — PX: INTRAOPERATIVE TRANSTHORACIC ECHOCARDIOGRAM: SHX6523

## 2024-09-23 LAB — POCT I-STAT, CHEM 8
BUN: 15 mg/dL (ref 8–23)
Calcium, Ion: 0.96 mmol/L — ABNORMAL LOW (ref 1.15–1.40)
Chloride: 106 mmol/L (ref 98–111)
Creatinine, Ser: 0.7 mg/dL (ref 0.44–1.00)
Glucose, Bld: 105 mg/dL — ABNORMAL HIGH (ref 70–99)
HCT: 25 % — ABNORMAL LOW (ref 36.0–46.0)
Hemoglobin: 8.5 g/dL — ABNORMAL LOW (ref 12.0–15.0)
Potassium: 3.3 mmol/L — ABNORMAL LOW (ref 3.5–5.1)
Sodium: 144 mmol/L (ref 135–145)
TCO2: 22 mmol/L (ref 22–32)

## 2024-09-23 LAB — POCT ACTIVATED CLOTTING TIME
Activated Clotting Time: 112 s
Activated Clotting Time: 302 s

## 2024-09-23 LAB — ECHOCARDIOGRAM LIMITED
AR max vel: 1.46 cm2
AV Area VTI: 1.59 cm2
AV Area mean vel: 1.47 cm2
AV Mean grad: 10.8 mmHg
AV Peak grad: 15.7 mmHg
Ao pk vel: 1.98 m/s
P 1/2 time: 565 ms

## 2024-09-23 LAB — ABO/RH: ABO/RH(D): O POS

## 2024-09-23 MED ORDER — POTASSIUM CHLORIDE CRYS ER 20 MEQ PO TBCR
40.0000 meq | EXTENDED_RELEASE_TABLET | Freq: Once | ORAL | Status: AC
  Administered 2024-09-23: 40 meq via ORAL
  Filled 2024-09-23: qty 2

## 2024-09-23 MED ORDER — SODIUM CHLORIDE 0.9% FLUSH
3.0000 mL | Freq: Two times a day (BID) | INTRAVENOUS | Status: DC
  Administered 2024-09-23 – 2024-09-24 (×2): 3 mL via INTRAVENOUS

## 2024-09-23 MED ORDER — BUPROPION HCL ER (XL) 300 MG PO TB24
300.0000 mg | ORAL_TABLET | Freq: Every day | ORAL | Status: DC
  Administered 2024-09-23 – 2024-09-24 (×2): 300 mg via ORAL
  Filled 2024-09-23 (×3): qty 1

## 2024-09-23 MED ORDER — TRAZODONE HCL 50 MG PO TABS
50.0000 mg | ORAL_TABLET | Freq: Every evening | ORAL | Status: DC | PRN
  Administered 2024-09-23 – 2024-09-24 (×2): 50 mg via ORAL
  Filled 2024-09-23 (×2): qty 1

## 2024-09-23 MED ORDER — METHOCARBAMOL 500 MG PO TABS: 500.0000 mg | ORAL_TABLET | Freq: Three times a day (TID) | ORAL | Status: DC | PRN

## 2024-09-23 MED ORDER — PANCRELIPASE (LIP-PROT-AMYL) 36000-114000 UNITS PO CPEP
72000.0000 [IU] | ORAL_CAPSULE | Freq: Three times a day (TID) | ORAL | Status: DC
Start: 2024-09-23 — End: 2024-09-24
  Administered 2024-09-23 – 2024-09-24 (×2): 72000 [IU] via ORAL
  Filled 2024-09-23 (×3): qty 2

## 2024-09-23 MED ORDER — ACETAMINOPHEN 650 MG RE SUPP: 650.0000 mg | Freq: Four times a day (QID) | RECTAL | Status: DC | PRN

## 2024-09-23 MED ORDER — DULOXETINE HCL 60 MG PO CPEP
60.0000 mg | ORAL_CAPSULE | Freq: Every morning | ORAL | Status: DC
  Administered 2024-09-23 – 2024-09-24 (×2): 60 mg via ORAL
  Filled 2024-09-23 (×3): qty 1

## 2024-09-23 MED ORDER — LIDOCAINE HCL (PF) 1 % IJ SOLN
INTRAMUSCULAR | Status: DC | PRN
  Administered 2024-09-23 (×2): 12 mL

## 2024-09-23 MED ORDER — SODIUM CHLORIDE 0.9 % IV SOLN: 250.0000 mL | INTRAVENOUS | Status: DC | PRN

## 2024-09-23 MED ORDER — ACETAMINOPHEN 325 MG PO TABS
650.0000 mg | ORAL_TABLET | Freq: Four times a day (QID) | ORAL | Status: DC | PRN
  Administered 2024-09-23 – 2024-09-24 (×2): 650 mg via ORAL
  Filled 2024-09-23 (×2): qty 2

## 2024-09-23 MED ORDER — HEPARIN SODIUM (PORCINE) 1000 UNIT/ML IJ SOLN
INTRAMUSCULAR | Status: DC | PRN
  Administered 2024-09-23: 8000 [IU] via INTRAVENOUS

## 2024-09-23 MED ORDER — EPHEDRINE SULFATE-NACL 50-0.9 MG/10ML-% IV SOSY
PREFILLED_SYRINGE | INTRAVENOUS | Status: DC | PRN
  Administered 2024-09-23: 2.5 mg via INTRAVENOUS

## 2024-09-23 MED ORDER — ACETAMINOPHEN 500 MG PO TABS
1000.0000 mg | ORAL_TABLET | Freq: Once | ORAL | Status: AC
  Administered 2024-09-23: 1000 mg via ORAL
  Filled 2024-09-23: qty 2

## 2024-09-23 MED ORDER — PROPOFOL 500 MG/50ML IV EMUL
INTRAVENOUS | Status: DC | PRN
  Administered 2024-09-23: 20 mg via INTRAVENOUS
  Administered 2024-09-23: 20 ug/kg/min via INTRAVENOUS

## 2024-09-23 MED ORDER — NITROGLYCERIN IN D5W 200-5 MCG/ML-% IV SOLN: 0.0000 ug/min | INTRAVENOUS | Status: DC

## 2024-09-23 MED ORDER — SODIUM CHLORIDE 0.9 % IV SOLN: INTRAVENOUS | Status: AC

## 2024-09-23 MED ORDER — MORPHINE SULFATE (PF) 2 MG/ML IV SOLN: 1.0000 mg | INTRAVENOUS | Status: DC | PRN

## 2024-09-23 MED ORDER — CEFAZOLIN SODIUM-DEXTROSE 2-4 GM/100ML-% IV SOLN
2.0000 g | Freq: Three times a day (TID) | INTRAVENOUS | Status: AC
  Administered 2024-09-23 (×2): 2 g via INTRAVENOUS
  Filled 2024-09-23 (×2): qty 100

## 2024-09-23 MED ORDER — IODIXANOL 320 MG/ML IV SOLN
INTRAVENOUS | Status: DC | PRN
  Administered 2024-09-23: 30 mL via INTRA_ARTERIAL

## 2024-09-23 MED ORDER — CHLORHEXIDINE GLUCONATE 0.12 % MT SOLN
15.0000 mL | Freq: Once | OROMUCOSAL | Status: AC
  Administered 2024-09-23: 15 mL via OROMUCOSAL
  Filled 2024-09-23: qty 15

## 2024-09-23 MED ORDER — CHLORHEXIDINE GLUCONATE 4 % EX SOLN: 60.0000 mL | Freq: Once | CUTANEOUS | Status: DC

## 2024-09-23 MED ORDER — PANTOPRAZOLE SODIUM 40 MG PO TBEC
40.0000 mg | DELAYED_RELEASE_TABLET | Freq: Two times a day (BID) | ORAL | Status: DC
  Administered 2024-09-23 – 2024-09-24 (×3): 40 mg via ORAL
  Filled 2024-09-23 (×4): qty 1

## 2024-09-23 MED ORDER — ONDANSETRON HCL 4 MG/2ML IJ SOLN: 4.0000 mg | Freq: Four times a day (QID) | INTRAMUSCULAR | Status: DC | PRN

## 2024-09-23 MED ORDER — SODIUM CHLORIDE 0.9% FLUSH: 3.0000 mL | INTRAVENOUS | Status: DC | PRN

## 2024-09-23 MED ORDER — CHLORHEXIDINE GLUCONATE 4 % EX SOLN: 30.0000 mL | CUTANEOUS | Status: DC

## 2024-09-23 MED ORDER — PANCRELIPASE (LIP-PROT-AMYL) 36000-114000 UNITS PO CPEP
36000.0000 [IU] | ORAL_CAPSULE | ORAL | Status: DC
Start: 2024-09-23 — End: 2024-09-23

## 2024-09-23 MED ORDER — LACTATED RINGERS IV SOLN: INTRAVENOUS | Status: DC | PRN

## 2024-09-23 MED ORDER — OXYCODONE HCL 5 MG PO TABS: 5.0000 mg | ORAL_TABLET | ORAL | Status: DC | PRN

## 2024-09-23 MED ORDER — HEPARIN (PORCINE) IN NACL 2000-0.9 UNIT/L-% IV SOLN
INTRAVENOUS | Status: DC | PRN
  Administered 2024-09-23: 1000 mL

## 2024-09-23 MED ORDER — PROTAMINE SULFATE 10 MG/ML IV SOLN
INTRAVENOUS | Status: DC | PRN
  Administered 2024-09-23: 80 mg via INTRAVENOUS

## 2024-09-23 MED ORDER — FENTANYL CITRATE (PF) 100 MCG/2ML IJ SOLN
INTRAMUSCULAR | Status: DC | PRN
  Administered 2024-09-23: 25 ug via INTRAVENOUS

## 2024-09-23 MED ORDER — CLOPIDOGREL BISULFATE 75 MG PO TABS
75.0000 mg | ORAL_TABLET | Freq: Every day | ORAL | Status: DC
  Administered 2024-09-23 – 2024-09-24 (×2): 75 mg via ORAL
  Filled 2024-09-23 (×2): qty 1

## 2024-09-23 MED ORDER — LACTATED RINGERS IV SOLN: INTRAVENOUS | Status: DC

## 2024-09-23 MED ORDER — SODIUM CHLORIDE 0.9 % IV SOLN: INTRAVENOUS | Status: DC

## 2024-09-23 MED ORDER — LIDOCAINE HCL (PF) 1 % IJ SOLN
INTRAMUSCULAR | Status: AC
  Filled 2024-09-23: qty 30

## 2024-09-23 MED ORDER — FENTANYL CITRATE (PF) 100 MCG/2ML IJ SOLN
INTRAMUSCULAR | Status: AC
  Filled 2024-09-23: qty 2

## 2024-09-23 MED ORDER — NOREPINEPHRINE 4 MG/250ML-% IV SOLN: 0.0000 ug/min | INTRAVENOUS | Status: DC

## 2024-09-23 MED ORDER — TRAMADOL HCL 50 MG PO TABS: 50.0000 mg | ORAL_TABLET | ORAL | Status: DC | PRN

## 2024-09-23 MED ORDER — PANCRELIPASE (LIP-PROT-AMYL) 36000-114000 UNITS PO CPEP
36000.0000 [IU] | ORAL_CAPSULE | ORAL | Status: DC | PRN
Start: 2024-09-23 — End: 2024-09-24

## 2024-09-23 NOTE — Interval H&P Note (Signed)
 History and Physical Interval Note:  09/23/2024 5:59 AM  Kimberly Stone  has presented today for surgery, with the diagnosis of Severe Aortic Stenosis.  The various methods of treatment have been discussed with the patient and family. After consideration of risks, benefits and other options for treatment, the patient has consented to  Procedure(s): Transcatheter Aortic Valve Replacement, Transfemoral (N/A) ECHOCARDIOGRAM, TRANSTHORACIC (N/A) as a surgical intervention.  The patient's history has been reviewed, patient examined, no change in status, stable for surgery.  I have reviewed the patient's chart and labs.  Questions were answered to the patient's satisfaction.     Con RAMAN Tommie Dejoseph

## 2024-09-23 NOTE — CV Procedure (Signed)
 HEART AND VASCULAR CENTER  TAVR OPERATIVE NOTE   Date of Procedure:  09/23/2024  Preoperative Diagnosis: Severe Aortic Stenosis   Postoperative Diagnosis: Same   Procedure:   Transcatheter Aortic Valve Replacement - Transfemoral Approach  Edwards Sapien 3 THV (size 23 mm, model # [id number], serial # [id number] )   Co-Surgeons:  Lonni Cash, MD and Con Clunes , MD   Anesthesiologist:  Darlyn  Echocardiographer:  Santo  Pre-operative Echo Findings: Severe aortic stenosis Mild left ventricular systolic dysfunction  Post-operative Echo Findings: No paravalvular leak Normal left ventricular systolic function  BRIEF CLINICAL NOTE AND INDICATIONS FOR SURGERY  78 yo female with history of CAD, LBBB, anxiety, depression, fibromyalgia, GERD, HLD, HTN and 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 Echo May 2025 with LVEF=55-60%. Grade 2 diastolic dysfunction. Mild MR. Moderate to severe aortic valve stenosis (mean gradient 22 mmHg, AVA 0.85 cm2, DI 0.24, SVI 37). Moderate aortic valve insufficiency. Cardiac cath 06/27/24 with no evidence of CAD. Normal right heart pressures. Cardiac CT August 2025 with AV calcium  score of 656. AV annular area of 406 mm2 suitable for a 23 mm Edwards Sapien 3 THV. CTA of the abdomen and pelvis with anatomy suitable for transfemoral access for TAVR. She is felt to require TAVR due to moderate to severe mixed aortic valve disease.    During the course of the patient's preoperative work up they have been evaluated comprehensively by a multidisciplinary team of specialists coordinated through the Multidisciplinary Heart Valve Clinic in the [facility].  They have been demonstrated to suffer from symptomatic severe aortic stenosis as noted above. The patient has been counseled extensively as to the relative risks and benefits of all options for the treatment of severe  aortic stenosis including long term medical therapy, conventional surgery for aortic valve replacement, and transcatheter aortic valve replacement.  The patient has been independently evaluated by Dr. Clunes with CT surgery and they are felt to be at high risk for conventional surgical aortic valve replacement. The surgeon indicated the patient would be a poor candidate for conventional surgery. Based upon review of all of the patient's preoperative diagnostic tests they are felt to be candidate for transcatheter aortic valve replacement using the transfemoral approach as an alternative to high risk conventional surgery.    Following the decision to proceed with transcatheter aortic valve replacement, a discussion has been held regarding what types of management strategies would be attempted intraoperatively in the event of life-threatening complications, including whether or not the patient would be considered a candidate for the use of cardiopulmonary bypass and/or conversion to open sternotomy for attempted surgical intervention.  The patient has been advised of a variety of complications that might develop peculiar to this approach including but not limited to risks of death, stroke, paravalvular leak, aortic dissection or other major vascular complications, aortic annulus rupture, device embolization, cardiac rupture or perforation, acute myocardial infarction, arrhythmia, heart block or bradycardia requiring permanent pacemaker placement, congestive heart failure, respiratory failure, renal failure, pneumonia, infection, other late complications related to structural valve deterioration or migration, or other complications that might ultimately cause a temporary or permanent loss of functional independence or other long term morbidity.  The patient provides full informed consent for the procedure as described and all questions were answered preoperatively.    DETAILS OF THE OPERATIVE  PROCEDURE  PREPARATION:   The patient is brought to the operating room on  the above mentioned date and central monitoring was established by the anesthesia team including placement of a radial arterial line. The patient is placed in the supine position on the operating table.  Intravenous antibiotics are administered. Conscious sedation is used.   Baseline transthoracic echocardiogram was performed. The patient's chest, abdomen, both groins, and both lower extremities are prepared and draped in a sterile manner. A time out procedure is performed.   PERIPHERAL ACCESS:   Using the modified Seldinger technique, femoral arterial and venous access were obtained with placement of a 6 Fr sheath in the artery and a 7 Fr sheath in the vein on the left side using u/s guidance.  A pigtail diagnostic catheter was passed through the femoral arterial sheath under fluoroscopic guidance into the aortic root.  A temporary transvenous pacemaker catheter was passed through the femoral venous sheath under fluoroscopic guidance into the right ventricle.  The pacemaker was tested to ensure stable lead placement and pacemaker capture. Aortic root angiography was performed in order to determine the optimal angiographic angle for valve deployment.  TRANSFEMORAL ACCESS:  A micropuncture kit was used to gain access to the right femoral artery using u/s guidance. Position confirmed with angiography. Pre-closure with double ProGlide closure devices. The patient was heparinized systemically and ACT verified > 250 seconds.    A 14 Fr transfemoral E-sheath was introduced into the right femoral artery after progressively dilating over an Amplatz superstiff wire. An AL-1 catheter was used to direct a straight-tip exchange length wire across the native aortic valve into the left ventricle. This was exchanged out for a pigtail catheter and position was confirmed in the LV apex. Simultaneous LV and Ao pressures were recorded.  The pigtail  catheter was then exchanged for a Safari wire in the LV apex.   TRANSCATHETER HEART VALVE DEPLOYMENT:  An Edwards Sapien 3 THV (size 23 mm) was prepared and crimped per manufacturer's guidelines, and the proper orientation of the valve is confirmed on the [employer] delivery system. The valve was advanced through the introducer sheath using normal technique until in an appropriate position in the abdominal aorta beyond the sheath tip. The balloon was then retracted and using the fine-tuning wheel was centered on the valve. The valve was then advanced across the aortic arch using appropriate flexion of the catheter. The valve was carefully positioned across the aortic valve annulus. The Commander catheter was retracted using normal technique. Once final position of the valve has been confirmed by angiographic assessment, the valve is deployed while temporarily holding ventilation and during rapid ventricular pacing to maintain systolic blood pressure < 50 mmHg and pulse pressure < 10 mmHg. The balloon inflation is held for >3 seconds after reaching full deployment volume. Once the balloon has fully deflated the balloon is retracted into the ascending aorta and valve function is assessed using TTE. There is felt to be no paravalvular leak and no central aortic insufficiency.  The patient's hemodynamic recovery following valve deployment is good.  The deployment balloon and guidewire are both removed. Echo demostrated acceptable post-procedural gradients, stable mitral valve function, and no AI.   PROCEDURE COMPLETION:  The sheath was then removed and closure devices were completed. Protamine was administered once femoral arterial repair was complete. The temporary pacemaker, pigtail catheters and femoral sheaths were removed with a Mynx closure device placed in the artery and manual pressure used for venous hemostasis.    The patient tolerated the procedure well and is transported to the surgical  intensive  care in stable condition. There were no immediate intraoperative complications. All sponge instrument and needle counts are verified correct at completion of the operation.   No blood products were administered during the operation.  The patient received a total of 30 mL of intravenous contrast during the procedure.  LVEDP: 13 mmHg  Lonni Cash MD, FACC 09/23/2024 9:19 AM

## 2024-09-23 NOTE — Progress Notes (Signed)
  HEART AND VASCULAR CENTER   MULTIDISCIPLINARY HEART VALVE TEAM  Patient doing well s/p TAVR. She is hemodynamically stable. Groin sites stable. ECG with old LBBB and no high grade block. Transferred from cath lab holding to 4E. K 3.3- will supplement w/ Kdur 40meq x1. Early ambulation after bedrest completed and hopeful discharge over the next 24-48 hours.   Lamarr Hummer PA-C  [facility]  Pager [phone]

## 2024-09-23 NOTE — Plan of Care (Signed)

## 2024-09-23 NOTE — Progress Notes (Signed)
 Site area: L femoral (venous) Site Prior to Removal:  Level 0 Pressure Applied For: 20 min (slight ooze after 15 min, resolved w/ additional pressure) Manual:   yes Patient Status During Pull:  stable Post Pull Site:  Level 0 Post Pull Instructions Given:  yes Post Pull Pulses Present: +1 DP bilateral Dressing Applied:  gauze & tegaderm  Bedrest begins @ 1020 (x4 hours) Comments:

## 2024-09-23 NOTE — Progress Notes (Signed)
 Pt arrived to 4E rm 9, VSS, oriented to unit, call light within reach, bedrest until 1420.   Laneta JAYSON Rao, RN 09/23/2024 1:49 PM

## 2024-09-23 NOTE — Discharge Summary (Signed)
 HEART AND VASCULAR CENTER   MULTIDISCIPLINARY HEART VALVE TEAM  Discharge Summary    Patient ID: Kimberly Stone MRN: [id number]; DOB: [birth year]  Admit date: 09/23/2024 Discharge date: 09/24/2024  PCP:  Shayne Anes, MD  [facility] Cardiologist:  Jerel Balding, MD  [facility] Structural heart: Lonni Cash, MD [facility] Electrophysiologist:  None   Discharge Diagnoses    Principal Problem:   S/P TAVR (transcatheter aortic valve replacement) Active Problems:   Hyperlipidemia   HTN (hypertension)   CAD (coronary artery disease)   Statin intolerance   LBBB (left bundle branch block)   Aortic stenosis   Allergies Allergies  Allergen Reactions   Aspirin  Other (See Comments)    PT CANNOT TAKE DUE TO HX OF GASTRIC BYPASS SURGERY    Loperamide Palpitations and Other (See Comments)   Statins     Joint pain   Atorvastatin      elevated LFTs   Beta Adrenergic Blockers     dyspnea   Celecoxib     UGI bleed   Desvenlafaxine     racing, twitching, everything going too fast.   Ezetimibe      liver panel went up   Nsaids     Other Reaction(s): prior GI bleed   Rosuvastatin     Other Reaction(s): elevated LFTs   Simvastatin     Other Reaction(s): elevated LF's, elevated LFTs, muscle problems   Erythromycin Other (See Comments)    UNKNOWN    Diagnostic Studies/Procedures    HEART AND VASCULAR CENTER  TAVR OPERATIVE NOTE     Date of Procedure:                09/23/2024   Preoperative Diagnosis:      Severe Aortic Stenosis    Postoperative Diagnosis:    Same    Procedure:        Transcatheter Aortic Valve Replacement - Transfemoral Approach             Edwards Sapien 3 THV (size 23 mm, model # [id number], serial # [id number] )              Co-Surgeons:                        Lonni Cash, MD and Con Clunes , MD    Anesthesiologist:                  Darlyn   Echocardiographer:              Santo   Pre-operative Echo  Findings: Severe aortic stenosis Mild left ventricular systolic dysfunction   Post-operative Echo Findings: No paravalvular leak Normal left ventricular systolic function   _____________    Echo 09/25/24: completed but pending formal read at the time of discharge   History of Present Illness     Kimberly Stone is a 78 y.o. female with a history of coronary disease (presented with non-STEMI in 2005 related to severe diffuse disease in ramus intermedius, not amenable to PCI), LBBB, HTN, HLD, GERD, resolution of obesity and prediabetes after gastric bypass surgery and pLFLG severe AS with moderate AS who presented to [facility] on 09/23/24 for planned TAVR.   Echo May 2025 with LVEF=55-60%, G2DD, mild MR and moderate to severe aortic valve stenosis (mean gradient 22 mmHg, AVA 0.85 cm2, DI 0.24, SVI 37) as well as moderate aortic valve insufficiency. Cardiac cath 06/27/24 with no evidence of CAD and normal right  heart pressures. She reported progressive fatigue, dyspnea with exertion and dizziness.   The patient was evaluated by the multidisciplinary valve team and felt to have severe, symptomatic aortic stenosis and to be a suitable candidate for TAVR, which was set up for 09/23/24.  Hospital Course     Consultants: none   Severe AS:  -- S/p successful TAVR with a 23 mm Edwards Sapien 3 Ultra Resilia THV via the TF approach on 09/23/24.  -- Post operative echo completed but pending formal read. -- Groin sites are stable.  -- ECG with old LBBB and no high grade heart block. -- Continue home clopidogrel  75mg  daily.  -- Met with cardiac rehab to discuss CRP phase II.  -- Plan for discharge home today with close follow up in the outpatient setting.   CAD: -- NSTEMI in 2005 related to severe diffuse disease in ramus intermedius, not amenable to PCI. -- Cardiac cath 06/27/24 with no evidence of CAD. -- Continue clopidogrel  75mg  daily.  -- Statin intolerant. Continue Repatha   HTN: -- BP well  controled. -- Continue amlodipine  5mg  daily.   Hypokalemia: -- Potassium has been supplemented. -- I have asked her to continue Lasix  20mg  daily for now, but will likely stop it in the office next week as LVEDP normal at the time of TAVR and appears euvolemic.  _____________  Discharge Vitals Blood pressure 120/81, pulse 94, temperature 98.7 F (37.1 C), temperature source Oral, resp. rate 20, height 5' 2 (1.575 m), weight 48.6 kg, last menstrual period 03/18/1972, SpO2 95%.  Filed Weights   09/23/24 0603 09/23/24 1324 09/24/24 0704  Weight: 48.5 kg 49.9 kg 48.6 kg     GEN: Well nourished, well developed in no acute distress NECK: No JVD CARDIAC: RRR, no murmurs, rubs, gallops RESPIRATORY:  Clear to auscultation without rales, wheezing or rhonchi  ABDOMEN: Soft, non-tender, non-distended EXTREMITIES:  No edema; No deformity.  Groin sites clear without hematoma or ecchymosis.    Disposition   Pt is being discharged home today in good condition.  Follow-up Plans & Appointments     Follow-up Information     Sebastian Lamarr SAUNDERS, PA-C. Go on 09/29/2024.   Specialties: Cardiology, Radiology Why: @ 9:30am, please arrive at least 20 minutes early Contact information: [address] [place] [address] [phone]                  Discharge Medications   Allergies as of 09/24/2024       Reactions   Aspirin  Other (See Comments)   PT CANNOT TAKE DUE TO HX OF GASTRIC BYPASS SURGERY    Loperamide Palpitations, Other (See Comments)   Statins    Joint pain   Atorvastatin     elevated LFTs   Beta Adrenergic Blockers    dyspnea   Celecoxib    UGI bleed   Desvenlafaxine    racing, twitching, everything going too fast.   Ezetimibe     liver panel went up   Nsaids    Other Reaction(s): prior GI bleed   Rosuvastatin    Other Reaction(s): elevated LFTs   Simvastatin    Other Reaction(s): elevated LF's, elevated LFTs, muscle problems   Erythromycin Other  (See Comments)   UNKNOWN        Medication List     TAKE these medications    acetaminophen  650 MG CR tablet Commonly known as: TYLENOL  Take 1,300 mg by mouth 2 (two) times daily as needed for pain.   amLODipine  5  MG tablet Commonly known as: NORVASC  Take 5 mg by mouth in the morning.   buPROPion  300 MG 24 hr tablet Commonly known as: Wellbutrin  XL Take 1 tablet (300 mg total) by mouth daily.   clopidogrel  75 MG tablet Commonly known as: PLAVIX  TAKE 1 TABLET (75 MG TOTAL) BY MOUTH DAILY. PLEASE SCHEDULE APPOINTMENT FOR REFILLS.   Creon 36000 UNITS Cpep capsule Generic drug: lipase/protease/amylase Take 36,000-72,000 Units by mouth See admin instructions. Take 2 capsules with each meal and take 1 capsule with each snack   diphenoxylate-atropine 2.5-0.025 MG tablet Commonly known as: LOMOTIL Take 1 tablet by mouth 4 (four) times daily as needed.   DULoxetine  60 MG capsule Commonly known as: Cymbalta  Take 1 capsule (60 mg total) by mouth 2 (two) times daily. What changed: when to take this   furosemide  20 MG tablet Commonly known as: LASIX  Take 1 tablet (20 mg total) by mouth daily. Be sure to eat potassium rich foods while taking Furosemide .   meclizine 25 MG tablet Commonly known as: ANTIVERT Take 25 mg by mouth 3 (three) times daily as needed.   methocarbamol  500 MG tablet Commonly known as: ROBAXIN  Take 500 mg by mouth every 8 (eight) hours as needed for muscle spasms.   multivitamin with minerals Tabs tablet Take 1 tablet by mouth in the morning.   pantoprazole  40 MG tablet Commonly known as: PROTONIX  Take 40 mg by mouth 2 (two) times daily.   promethazine  25 MG tablet Commonly known as: PHENERGAN  Take 25 mg by mouth every 8 (eight) hours as needed for nausea or vomiting.   QC TUMERIC COMPLEX PO Take 1 capsule by mouth in the morning.   Repatha 140 MG/ML Sosy Generic drug: Evolocumab Inject 140 mg into the skin every 14 (fourteen) days.    Salonpas Pain Relief Patch Ptch Place 1 patch onto the skin daily as needed (pain.).   timolol 0.5 % ophthalmic solution Commonly known as: TIMOPTIC Place 1 drop into both eyes in the morning.   Travoprost (BAK Free) 0.004 % Soln ophthalmic solution Commonly known as: TRAVATAN Place 1 drop into both eyes at bedtime.   traZODone  50 MG tablet Commonly known as: DESYREL  Take 1.5 tablets (75 mg total) by mouth at bedtime. What changed:  how much to take when to take this   ursodiol 300 MG capsule Commonly known as: ACTIGALL Take 300 mg by mouth in the morning.   VITAMIN C PO Take 1 tablet by mouth in the morning.   VITAMIN D3 ULTRA STRENGTH PO Take 5,000 Units by mouth in the morning.         Outstanding Labs/Studies   none  ______________________  Duration of Discharge Encounter: APP Time: 20 minutes    Signed, Lamarr Hummer, PA-C 09/24/2024, 9:43 AM [phone]

## 2024-09-23 NOTE — Op Note (Addendum)
 HEART AND VASCULAR CENTER   MULTIDISCIPLINARY HEART VALVE TEAM   TAVR OPERATIVE NOTE   Date of Procedure:  09/23/2024  Preoperative Diagnosis: Severe Aortic Stenosis   Postoperative Diagnosis: Same   Procedure:   Transcatheter Aortic Valve Replacement - Percutaneous Transfemoral Approach  Edwards Sapien 3 Ultra Resilia (size 23 mm, model # 9755RSL, serial # [id number])   Co-Surgeons:  Con Clunes, MD and Lonni Cash, MD  Anesthesiologist:  Darlyn  Echocardiographer:  Shirlene  Pre-operative Echo Findings: Severe aortic stenosis Reduced left ventricular systolic function  Post-operative Echo Findings: No paravalvular leak Improved left ventricular systolic function   BRIEF CLINICAL NOTE AND INDICATIONS FOR SURGERY  Kimberly Stone is a 78 y.o. female presents for evaluation of severe aortic stenosis and TAVR.  She has a history of coronary artery disease and NSTEMI in 2005 not amenable to PCI.  She also has a history of left bundle branch block, hypertension, hyperlipidemia, low-flow low gradient severe aortic stenosis with moderate AI.  She also has a history of gastric bypass surgery.  She recently was having diarrhea and found to have pancreatic insufficiency, she is now on Creon which has helped significantly. Her weight previously was 125 and she is now down to 104-106 lbs.   Her symptoms include dyspnea with exertion, fatigue, and palpitations.  The palpitations resolved with rest.  She denies dizziness or chest pain.  She notes that her dyspnea is the worst when she is working around the house, working outside, climbing stairs, or even walking short distances.    DETAILS OF THE OPERATIVE PROCEDURE  PREPARATION:    The patient was brought to the operating room on the above mentioned date and appropriate monitoring was established by the anesthesia team. The patient was placed in the supine position on the operating table.  Intravenous antibiotics were  administered. The patient was monitored closely throughout the procedure under conscious sedation.  Baseline transthoracic echocardiogram was performed. The patient's abdomen and both groins were prepped and draped in a sterile manner. A time out procedure was performed.   PERIPHERAL ACCESS:    Using the modified Seldinger technique, femoral arterial and venous access was obtained with placement of 6 Fr sheaths on the left side.  A pigtail diagnostic catheter was passed through the left arterial sheath under fluoroscopic guidance into the aortic root.  A temporary transvenous pacemaker catheter was passed through the left femoral venous sheath under fluoroscopic guidance into the right ventricle.  The pacemaker was tested to ensure stable lead placement and pacemaker capture. Aortic root angiography was performed in order to determine the optimal angiographic angle for valve deployment.   TRANSFEMORAL ACCESS:   Percutaneous transfemoral access and sheath placement was performed using ultrasound guidance.  The right common femoral artery was cannulated using a micropuncture needle and appropriate location was verified using hand injection angiogram.  A pair of Abbott Perclose percutaneous closure devices were placed and a 6 French sheath replaced into the femoral artery.  The patient was heparinized systemically and ACT verified > 250 seconds.    A 14 Fr transfemoral E-sheath was introduced into the right common femoral artery after progressively dilating over an Amplatz superstiff wire. An AL-2 catheter was used to direct a J-wire across the native aortic valve into the left ventricle. This was exchanged out for a pigtail catheter and position was confirmed in the LV apex. Simultaneous LV and Ao pressures were recorded.  The LVEDP was 14 mmHg.  The pigtail catheter was exchanged for  a Safari wire in the LV apex.   TRANSCATHETER HEART VALVE DEPLOYMENT:   An Edwards Sapien 3 Ultra transcatheter heart  valve (23 mm) was prepared and crimped per manufacturer's guidelines, and the proper orientation of the valve was confirmed on the [employer] delivery system. The valve was advanced through the introducer sheath using normal technique until in an appropriate position in the abdominal aorta beyond the sheath tip. The balloon was then retracted and using the fine-tuning wheel was centered on the valve. The valve was then advanced across the aortic arch using appropriate flexion of the catheter. The valve was carefully positioned across the aortic valve annulus. The Commander catheter was retracted using normal technique. Once final position of the valve was confirmed by angiographic assessment, the valve was deployed during rapid ventricular pacing to maintain systolic blood pressure < 50 mmHg and pulse pressure < 10 mmHg. The balloon inflation was held for >3 seconds after reaching full deployment volume. Once the balloon was fully deflated the balloon was retracted into the ascending aorta and valve function was assessed using echocardiography. There was felt to be no paravalvular leak and no central aortic insufficiency.  The patient's hemodynamic recovery following valve deployment was good.  The deployment balloon and guidewire were both removed.    PROCEDURE COMPLETION:   The sheath was removed and femoral artery closure performed.  Protamine was administered once femoral arterial repair was complete. The temporary pacemaker, pigtail catheter and femoral sheaths were removed with manual pressure used for venous hemostasis.  A Mynx femoral closure device was utilized following removal of the diagnostic sheath in the left femoral artery.  The patient tolerated the procedure well and was transported to the cath lab recovery area in stable condition. There were no immediate intraoperative complications. All sponge instrument and needle counts were verified correct at completion of the operation.   No  blood products were administered during the operation.  The patient received a total of 30 mL of intravenous contrast during the procedure.   Con GORMAN Clunes, MD 09/23/2024 1:21 PM

## 2024-09-23 NOTE — Transfer of Care (Signed)
 Immediate Anesthesia Transfer of Care Note  Patient: Kimberly Stone  Procedure(s) Performed: Transcatheter Aortic Valve Replacement, Transfemoral ECHOCARDIOGRAM, TRANSTHORACIC  Patient Location: PACU  Anesthesia Type:MAC  Level of Consciousness: awake and alert   Airway & Oxygen Therapy: Patient Spontanous Breathing and Patient connected to nasal cannula oxygen  Post-op Assessment: Report given to RN and Post -op Vital signs reviewed and stable  Post vital signs: Reviewed and stable  Last Vitals:  Vitals Value Taken Time  BP    Temp    Pulse 55 09/23/24 09:17  Resp 14 09/23/24 09:17  SpO2 95 % 09/23/24 09:17  Vitals shown include unfiled device data.  Last Pain:  Vitals:   09/23/24 0619  TempSrc:   PainSc: 0-No pain         Complications: There were no known notable events for this encounter.

## 2024-09-24 ENCOUNTER — Other Ambulatory Visit: Payer: Self-pay

## 2024-09-24 ENCOUNTER — Inpatient Hospital Stay (HOSPITAL_COMMUNITY)

## 2024-09-24 DIAGNOSIS — Z952 Presence of prosthetic heart valve: Secondary | ICD-10-CM

## 2024-09-24 DIAGNOSIS — I350 Nonrheumatic aortic (valve) stenosis: Secondary | ICD-10-CM | POA: Diagnosis not present

## 2024-09-24 DIAGNOSIS — E785 Hyperlipidemia, unspecified: Secondary | ICD-10-CM | POA: Diagnosis not present

## 2024-09-24 DIAGNOSIS — E876 Hypokalemia: Secondary | ICD-10-CM | POA: Diagnosis not present

## 2024-09-24 DIAGNOSIS — I10 Essential (primary) hypertension: Secondary | ICD-10-CM | POA: Diagnosis not present

## 2024-09-24 LAB — CBC
HCT: 33.5 % — ABNORMAL LOW (ref 36.0–46.0)
Hemoglobin: 11.3 g/dL — ABNORMAL LOW (ref 12.0–15.0)
MCH: 31.6 pg (ref 26.0–34.0)
MCHC: 33.7 g/dL (ref 30.0–36.0)
MCV: 93.6 fL (ref 80.0–100.0)
Platelets: 271 K/uL (ref 150–400)
RBC: 3.58 MIL/uL — ABNORMAL LOW (ref 3.87–5.11)
RDW: 12.6 % (ref 11.5–15.5)
WBC: 8.5 K/uL (ref 4.0–10.5)
nRBC: 0 % (ref 0.0–0.2)

## 2024-09-24 LAB — BASIC METABOLIC PANEL WITH GFR
Anion gap: 11 (ref 5–15)
BUN: 17 mg/dL (ref 8–23)
CO2: 22 mmol/L (ref 22–32)
Calcium: 8.6 mg/dL — ABNORMAL LOW (ref 8.9–10.3)
Chloride: 102 mmol/L (ref 98–111)
Creatinine, Ser: 0.97 mg/dL (ref 0.44–1.00)
GFR, Estimated: 60 mL/min — ABNORMAL LOW (ref >60)
Glucose, Bld: 111 mg/dL — ABNORMAL HIGH (ref 70–99)
Potassium: 4 mmol/L (ref 3.5–5.1)
Sodium: 135 mmol/L (ref 135–145)

## 2024-09-24 LAB — ECHOCARDIOGRAM COMPLETE
AR max vel: 1.26 cm2
AV Area VTI: 1.25 cm2
AV Area mean vel: 1.26 cm2
AV Mean grad: 7 mmHg
AV Peak grad: 13 mmHg
Ao pk vel: 1.8 m/s
Height: 62 in
S' Lateral: 3.1 cm
Single Plane A2C EF: 44.3 %
Weight: 1713.6 [oz_av]

## 2024-09-24 LAB — MAGNESIUM: Magnesium: 1.8 mg/dL (ref 1.7–2.4)

## 2024-09-24 NOTE — Progress Notes (Signed)
   09/24/24 1005  TOC Brief Assessment  Insurance and Status Reviewed  Patient has primary care physician Yes  Home environment has been reviewed home w/ spouse  Prior level of function: self/independent  Prior/Current Home Services No current home services  Social Drivers of Health Review SDOH reviewed no interventions necessary  Readmission risk has been reviewed Yes  Transition of care needs no transition of care needs at this time    Pt s/p TAVR, stable for transition home today, no HH or DME needs noted.

## 2024-09-24 NOTE — Progress Notes (Signed)
  Echocardiogram 2D Echocardiogram has been performed.  LAMON MAXWELL 09/24/2024, 9:04 AM

## 2024-09-24 NOTE — Progress Notes (Signed)
 Pt has been mobilizing hall without c/o. Discussed with pt restrictions, walking at home, and CRPII. Pt very receptive. [place] therefore will refer to [facility]. [id number] Aliene Aris BS, ACSM-CEP 09/24/2024 10:10 AM

## 2024-09-24 NOTE — Progress Notes (Signed)
 Discharge Nurse Summary: DC order noted per MD. DC RN at bedside with patient. Patient agreeable with discharge plan, states family will arrive soon for pickup. AVS printed/reviewed. PIV removed, skin intact. No DME needs. No home/TOC meds. CP/Edu resolved. Telemonitor returned to charging station. All belongings accounted for. Patient wheeled downstairs for discharge by private auto.   Rosario EMERSON Lund, RN

## 2024-09-24 NOTE — Discharge Instructions (Signed)

## 2024-09-24 NOTE — Anesthesia Postprocedure Evaluation (Signed)
 Anesthesia Post Note  Patient: Kimberly Stone  Procedure(s) Performed: Transcatheter Aortic Valve Replacement, Transfemoral ECHOCARDIOGRAM, TRANSTHORACIC     Anesthesia Type: MAC Anesthetic complications: no   There were no known notable events for this encounter.  Last Vitals:  Vitals:   09/24/24 0704 09/24/24 0808  BP:  120/81  Pulse:  94  Resp: (!) 22 20  Temp:  37.1 C  SpO2:  95%    Last Pain:  Vitals:   09/24/24 0808  TempSrc: Oral  PainSc: 0-No pain                 Norleen Pope

## 2024-09-25 ENCOUNTER — Telehealth: Payer: Self-pay

## 2024-09-25 NOTE — Telephone Encounter (Signed)
 Patient contacted regarding discharge from [facility] on 09/24/24  Patient understands to follow up with provider Izetta Hummer, PA-C on 09/29/24 at 9:30AM at [address] Location. Patient understands discharge instructions? Yes Patient understands medications and regiment? Yes Patient understands to bring all medications to this visit? Yes

## 2024-09-26 DIAGNOSIS — R198 Other specified symptoms and signs involving the digestive system and abdomen: Secondary | ICD-10-CM | POA: Diagnosis not present

## 2024-09-26 DIAGNOSIS — Z9884 Bariatric surgery status: Secondary | ICD-10-CM | POA: Diagnosis not present

## 2024-09-26 DIAGNOSIS — K8689 Other specified diseases of pancreas: Secondary | ICD-10-CM | POA: Diagnosis not present

## 2024-09-26 DIAGNOSIS — K219 Gastro-esophageal reflux disease without esophagitis: Secondary | ICD-10-CM | POA: Diagnosis not present

## 2024-09-26 DIAGNOSIS — R112 Nausea with vomiting, unspecified: Secondary | ICD-10-CM | POA: Diagnosis not present

## 2024-09-26 DIAGNOSIS — R634 Abnormal weight loss: Secondary | ICD-10-CM | POA: Diagnosis not present

## 2024-09-29 ENCOUNTER — Ambulatory Visit: Attending: Physician Assistant | Admitting: Physician Assistant

## 2024-09-29 ENCOUNTER — Telehealth (HOSPITAL_COMMUNITY): Payer: Self-pay

## 2024-09-29 VITALS — BP 112/58 | Ht 62.0 in | Wt 109.6 lb

## 2024-09-29 DIAGNOSIS — E876 Hypokalemia: Secondary | ICD-10-CM

## 2024-09-29 DIAGNOSIS — I10 Essential (primary) hypertension: Secondary | ICD-10-CM | POA: Diagnosis not present

## 2024-09-29 DIAGNOSIS — I2510 Atherosclerotic heart disease of native coronary artery without angina pectoris: Secondary | ICD-10-CM

## 2024-09-29 DIAGNOSIS — Z952 Presence of prosthetic heart valve: Secondary | ICD-10-CM

## 2024-09-29 MED ORDER — AMOXICILLIN 500 MG PO TABS: 2000.0000 mg | ORAL_TABLET | ORAL | 12 refills | Status: AC

## 2024-09-29 NOTE — Progress Notes (Signed)
 [facility]   MULTIDISCIPLINARY HEART VALVE CLINIC                                     Cardiology Office Note:    Date:  09/29/2024   ID:  Kimberly Stone, DOB [birth year], MRN [id number]  PCP:  Shayne Anes, MD  [facility] Cardiologist:  Jerel Balding, MD  [facility] Structural heart: Lonni Cash, MD [facility] Electrophysiologist:  None   Referring MD: Shayne Anes, MD   [facility] s/p TAVR   History of Present Illness:    Kimberly Stone is a 78 y.o. female with a hx of coronary disease (presented with non-STEMI in 2005 related to severe diffuse disease in ramus intermedius, not amenable to PCI), LBBB, HTN, HLD, GERD, resolution of obesity and prediabetes after gastric bypass surgery and pLFLG severe AS s/p TAVR (09/23/24) who presents to clinic for follow up.    Echo May 2025 with LVEF=55-60%, G2DD, mild MR and moderate to severe aortic valve stenosis (mean gradient 22 mmHg, AVA 0.85 cm2, DI 0.24, SVI 37) as well as moderate aortic valve insufficiency. Cardiac cath 06/27/24 with no evidence of CAD and normal right heart pressures. She reported progressive fatigue, dyspnea with exertion and dizziness. S/p TAVR with a 23 mm Edwards Sapien 3 Ultra Resilia THV via the TF approach on 09/23/24.   Today the patient presents to clinic for follow up. Here with her husband. No CP or SOB, but sometimes has a right sided chest pain with a deep breath in that isn't happening today. It started before her TAVR. No LE edema, orthopnea or PND. No dizziness or syncope. No blood in stool or urine. No palpitations. Going [place] the beach until 12/1.   Past Medical History:  Diagnosis Date   Acute MI (HCC) 11/2004   NONSTEMI 2005 NOINTERVENTION PER NOTE    Anemia    from bypass- iron and B12   Anxiety    Aortic stenosis    Arthritis    Coronary artery disease    DDD (degenerative disc disease), cervical    Depression    DJD (degenerative joint disease)    Fibromyalgia     GERD (gastroesophageal reflux disease)    GIB (gastrointestinal bleeding)    while on celecoxib post bypass   Hyperlipidemia    Hyperlipidemia LDL goal < 130 01/23/2014   Hypertension    Obesity    hx of bipass, now resolved.  borderline diabetes also resolved.   Osteopenia    Post-menopausal    S/P TAVR (transcatheter aortic valve replacement) 09/23/2024   s/p TAVR with a 23 mm Edwards S3UR via TF approach by Dr. Cash and Dr. Daniel   SCCA (squamous cell carcinoma) of skin 09/21/2015   left forearm medial cx3,82fu   SCCA (squamous cell carcinoma) of skin 07/26/2016   bridge of nose cx3 53fu   SCCA (squamous cell carcinoma) of skin 02/10/2020   right inner cheek tx after biopsy   SCCA (squamous cell carcinoma) of skin 02/10/2020   left hand tx after biopsy   Scoliosis    Sinusitis    Statin intolerance 01/23/2014     Current Medications: Current Meds  Medication Sig   acetaminophen  (TYLENOL ) 650 MG CR tablet Take 1,300 mg by mouth 2 (two) times daily as needed for pain.   amLODipine  (NORVASC ) 5 MG tablet Take 5 mg by mouth in the  morning.   amoxicillin (AMOXIL) 500 MG tablet Take 4 tablets (2,000 mg total) by mouth as directed. 1 hour prior to dental work including cleanings   Ascorbic Acid (VITAMIN C PO) Take 1 tablet by mouth in the morning.   buPROPion  (WELLBUTRIN  XL) 300 MG 24 hr tablet Take 1 tablet (300 mg total) by mouth daily.   Cholecalciferol (VITAMIN D3 ULTRA STRENGTH PO) Take 5,000 Units by mouth in the morning.   clopidogrel  (PLAVIX ) 75 MG tablet TAKE 1 TABLET (75 MG TOTAL) BY MOUTH DAILY. PLEASE SCHEDULE APPOINTMENT FOR REFILLS.   diphenoxylate-atropine (LOMOTIL) 2.5-0.025 MG tablet Take 1 tablet by mouth 4 (four) times daily as needed.   DULoxetine  (CYMBALTA ) 60 MG capsule Take 1 capsule (60 mg total) by mouth 2 (two) times daily. (Patient taking differently: Take 60 mg by mouth in the morning.)   Evolocumab (REPATHA) 140 MG/ML SOSY Inject 140 mg into the skin every  14 (fourteen) days.   furosemide  (LASIX ) 20 MG tablet Take 1 tablet (20 mg total) by mouth daily. Be sure to eat potassium rich foods while taking Furosemide .   lipase/protease/amylase (CREON) 36000 UNITS CPEP capsule Take 36,000-72,000 Units by mouth See admin instructions. Take 2 capsules with each meal and take 1 capsule with each snack   meclizine (ANTIVERT) 25 MG tablet Take 25 mg by mouth 3 (three) times daily as needed.   Menthol-Methyl Salicylate (SALONPAS PAIN RELIEF PATCH) PTCH Place 1 patch onto the skin daily as needed (pain.).   methocarbamol  (ROBAXIN ) 500 MG tablet Take 500 mg by mouth every 8 (eight) hours as needed for muscle spasms.   Multiple Vitamin (MULTIVITAMIN WITH MINERALS) TABS Take 1 tablet by mouth in the morning.   pantoprazole  (PROTONIX ) 40 MG tablet Take 40 mg by mouth 2 (two) times daily.   promethazine  (PHENERGAN ) 25 MG tablet Take 25 mg by mouth every 8 (eight) hours as needed for nausea or vomiting.   timolol (TIMOPTIC) 0.5 % ophthalmic solution Place 1 drop into both eyes in the morning.   Travoprost, BAK Free, (TRAVATAN) 0.004 % SOLN ophthalmic solution Place 1 drop into both eyes at bedtime.   traZODone  (DESYREL ) 50 MG tablet Take 1.5 tablets (75 mg total) by mouth at bedtime. (Patient taking differently: Take 50 mg by mouth at bedtime and may repeat dose one time if needed.)   Turmeric (QC TUMERIC COMPLEX PO) Take 1 capsule by mouth in the morning.   ursodiol (ACTIGALL) 300 MG capsule Take 300 mg by mouth in the morning.      ROS:   Please see the history of present illness.    All other systems reviewed and are negative.  EKGs   EKG Interpretation Date/Time:  Monday September 29 2024 09:37:39 EDT Ventricular Rate:  64 PR Interval:  194 QRS Duration:  144 QT Interval:  478 QTC Calculation: 493 R Axis:   -45  Text Interpretation: Normal sinus rhythm Left axis deviation Left bundle branch block Confirmed by Sebastian Collar [id number]) on 09/29/2024  9:45:41 AM   Risk Assessment/Calculations:           Physical Exam:    VS:  BP (!) 112/58   Ht 5' 2 (1.575 m)   Wt 109 lb 9.6 oz (49.7 kg)   LMP 03/18/1972 (Approximate)   SpO2 98%   BMI 20.05 kg/m     Wt Readings from Last 3 Encounters:  09/29/24 109 lb 9.6 oz (49.7 kg)  09/24/24 107 lb 1.6 oz (48.6 kg)  09/04/24 111  lb (50.3 kg)     GEN: Well nourished, well developed in no acute distress NECK: No JVD CARDIAC: RRR, no murmurs, rubs, gallops RESPIRATORY:  Clear to auscultation without rales, wheezing or rhonchi  ABDOMEN: Soft, non-tender, non-distended EXTREMITIES:  No edema; No deformity.  Groin sites clear without hematoma or ecchymosis.   ASSESSMENT:    1. S/P TAVR (transcatheter aortic valve replacement)   2. Coronary artery disease involving native coronary artery of native heart without angina pectoris   3. Essential hypertension   4. Hypokalemia     PLAN:    In order of problems listed above:  Severe AS s/p TAVR:  -- Pt doing excellent s/p TAVR.  -- ECG with sinus and old LBBB.  -- Groin sites healing well.  -- SBE prophylaxis discussed; I have RX'd amoxicillin.  -- Continue chronic clopidogrel  75mg  daily.  -- Cleared to resume all activities without restriction. -- I will see back for 1 month echo and OV.  CAD: -- NSTEMI in 2005 related to severe diffuse disease in ramus intermedius, not amenable to PCI. -- Cardiac cath 06/27/24 with no evidence of CAD. -- Continue clopidogrel  75mg  daily.  -- Statin intolerant. Continue Repatha    HTN: -- BP well controled. -- Continue amlodipine  5mg  daily.    Hypokalemia: -- Potassium has been supplemented. -- Will stop Lasix  20mg  daily and change to PRN.     Medication Adjustments/Labs and Tests Ordered: Current medicines are reviewed at length with the patient today.  Concerns regarding medicines are outlined above.  Orders Placed This Encounter  Procedures   EKG 12-Lead   Meds ordered this  encounter  Medications   amoxicillin (AMOXIL) 500 MG tablet    Sig: Take 4 tablets (2,000 mg total) by mouth as directed. 1 hour prior to dental work including cleanings    Dispense:  12 tablet    Refill:  12    Supervising Provider:   WONDA SHARPER [[id number]]    Patient Instructions  Medication Instructions:  Your physician has recommended you make the following change in your medication: CHANGE your Lasix  to taking only as needed for swelling.   START Amoxicillin 500 mg, take 4 tablets by mouth 1 hour prior to dental procedures and cleanings.   *If you need a refill on your cardiac medications before your next appointment, please call your pharmacy*  Lab Work: None needed If you have labs (blood work) drawn today and your tests are completely normal, you will receive your results only by: MyChart Message (if you have MyChart) OR A paper copy in the mail If you have any lab test that is abnormal or we need to change your treatment, we will call you to review the results.  Testing/Procedures: 11/20/24 Your physician has requested that you have an echocardiogram. Echocardiography is a painless test that uses sound waves to create images of your heart. It provides your doctor with information about the size and shape of your heart and how well your heart's chambers and valves are working. This procedure takes approximately one hour. There are no restrictions for this procedure. Please do NOT wear cologne, perfume, aftershave, or lotions (deodorant is allowed). Please arrive 15 minutes prior to your appointment time.  Please note: We ask at that you not bring children with you during ultrasound (echo/ vascular) testing. Due to room size and safety concerns, children are not allowed in the ultrasound rooms during exams. Our front office staff cannot provide observation of children in our lobby  area while testing is being conducted. An adult accompanying a patient to their appointment will only be  allowed in the ultrasound room at the discretion of the ultrasound technician under special circumstances. We apologize for any inconvenience.   Follow-Up: At [facility], you and your health needs are our priority.  As part of our continuing mission to provide you with exceptional heart care, our providers are all part of one team.  This team includes your primary Cardiologist (physician) and Advanced Practice Providers or APPs (Physician Assistants and Nurse Practitioners) who all work together to provide you with the care you need, when you need it.  Your next appointment:   As scheduled on 11/20/24  Provider:   Izetta Hummer, PA-C  We recommend signing up for the patient portal called MyChart.  Sign up information is provided on this After Visit Summary.  MyChart is used to connect with patients for Virtual Visits (Telemedicine).  Patients are able to view lab/test results, encounter notes, upcoming appointments, etc.  Non-urgent messages can be sent to your provider as well.   To learn more about what you can do with MyChart, go to [email or web address].          Signed, Lamarr Hummer, PA-C  09/29/2024 10:24 AM    [facility]

## 2024-09-29 NOTE — Telephone Encounter (Signed)
 Per phase I cardiac rehab, fax referral to [facility].

## 2024-09-29 NOTE — Patient Instructions (Addendum)
 Medication Instructions:  Your physician has recommended you make the following change in your medication: CHANGE your Lasix  to taking only as needed for swelling.   START Amoxicillin 500 mg, take 4 tablets by mouth 1 hour prior to dental procedures and cleanings.   *If you need a refill on your cardiac medications before your next appointment, please call your pharmacy*  Lab Work: None needed If you have labs (blood work) drawn today and your tests are completely normal, you will receive your results only by: MyChart Message (if you have MyChart) OR A paper copy in the mail If you have any lab test that is abnormal or we need to change your treatment, we will call you to review the results.  Testing/Procedures: 11/20/24 Your physician has requested that you have an echocardiogram. Echocardiography is a painless test that uses sound waves to create images of your heart. It provides your doctor with information about the size and shape of your heart and how well your heart's chambers and valves are working. This procedure takes approximately one hour. There are no restrictions for this procedure. Please do NOT wear cologne, perfume, aftershave, or lotions (deodorant is allowed). Please arrive 15 minutes prior to your appointment time.  Please note: We ask at that you not bring children with you during ultrasound (echo/ vascular) testing. Due to room size and safety concerns, children are not allowed in the ultrasound rooms during exams. Our front office staff cannot provide observation of children in our lobby area while testing is being conducted. An adult accompanying a patient to their appointment will only be allowed in the ultrasound room at the discretion of the ultrasound technician under special circumstances. We apologize for any inconvenience.   Follow-Up: At [facility], you and your health needs are our priority.  As part of our continuing mission to provide you with  exceptional heart care, our providers are all part of one team.  This team includes your primary Cardiologist (physician) and Advanced Practice Providers or APPs (Physician Assistants and Nurse Practitioners) who all work together to provide you with the care you need, when you need it.  Your next appointment:   As scheduled on 11/20/24  Provider:   Izetta Hummer, PA-C  We recommend signing up for the patient portal called MyChart.  Sign up information is provided on this After Visit Summary.  MyChart is used to connect with patients for Virtual Visits (Telemedicine).  Patients are able to view lab/test results, encounter notes, upcoming appointments, etc.  Non-urgent messages can be sent to your provider as well.   To learn more about what you can do with MyChart, go to [email or web address].

## 2024-09-30 DIAGNOSIS — M9904 Segmental and somatic dysfunction of sacral region: Secondary | ICD-10-CM | POA: Diagnosis not present

## 2024-09-30 DIAGNOSIS — M47812 Spondylosis without myelopathy or radiculopathy, cervical region: Secondary | ICD-10-CM | POA: Diagnosis not present

## 2024-09-30 DIAGNOSIS — M797 Fibromyalgia: Secondary | ICD-10-CM | POA: Diagnosis not present

## 2024-09-30 DIAGNOSIS — M4726 Other spondylosis with radiculopathy, lumbar region: Secondary | ICD-10-CM | POA: Diagnosis not present

## 2024-09-30 DIAGNOSIS — M9902 Segmental and somatic dysfunction of thoracic region: Secondary | ICD-10-CM | POA: Diagnosis not present

## 2024-09-30 DIAGNOSIS — M4125 Other idiopathic scoliosis, thoracolumbar region: Secondary | ICD-10-CM | POA: Diagnosis not present

## 2024-09-30 DIAGNOSIS — M9905 Segmental and somatic dysfunction of pelvic region: Secondary | ICD-10-CM | POA: Diagnosis not present

## 2024-09-30 DIAGNOSIS — M9903 Segmental and somatic dysfunction of lumbar region: Secondary | ICD-10-CM | POA: Diagnosis not present

## 2024-09-30 DIAGNOSIS — M9901 Segmental and somatic dysfunction of cervical region: Secondary | ICD-10-CM | POA: Diagnosis not present

## 2024-10-07 DIAGNOSIS — M9902 Segmental and somatic dysfunction of thoracic region: Secondary | ICD-10-CM | POA: Diagnosis not present

## 2024-10-07 DIAGNOSIS — M797 Fibromyalgia: Secondary | ICD-10-CM | POA: Diagnosis not present

## 2024-10-07 DIAGNOSIS — M9903 Segmental and somatic dysfunction of lumbar region: Secondary | ICD-10-CM | POA: Diagnosis not present

## 2024-10-07 DIAGNOSIS — M47812 Spondylosis without myelopathy or radiculopathy, cervical region: Secondary | ICD-10-CM | POA: Diagnosis not present

## 2024-10-07 DIAGNOSIS — M9905 Segmental and somatic dysfunction of pelvic region: Secondary | ICD-10-CM | POA: Diagnosis not present

## 2024-10-07 DIAGNOSIS — M4726 Other spondylosis with radiculopathy, lumbar region: Secondary | ICD-10-CM | POA: Diagnosis not present

## 2024-10-07 DIAGNOSIS — M9901 Segmental and somatic dysfunction of cervical region: Secondary | ICD-10-CM | POA: Diagnosis not present

## 2024-10-07 DIAGNOSIS — M4125 Other idiopathic scoliosis, thoracolumbar region: Secondary | ICD-10-CM | POA: Diagnosis not present

## 2024-10-07 DIAGNOSIS — M9904 Segmental and somatic dysfunction of sacral region: Secondary | ICD-10-CM | POA: Diagnosis not present

## 2024-10-09 DIAGNOSIS — M9901 Segmental and somatic dysfunction of cervical region: Secondary | ICD-10-CM | POA: Diagnosis not present

## 2024-10-09 DIAGNOSIS — M4726 Other spondylosis with radiculopathy, lumbar region: Secondary | ICD-10-CM | POA: Diagnosis not present

## 2024-10-09 DIAGNOSIS — M9903 Segmental and somatic dysfunction of lumbar region: Secondary | ICD-10-CM | POA: Diagnosis not present

## 2024-10-09 DIAGNOSIS — M797 Fibromyalgia: Secondary | ICD-10-CM | POA: Diagnosis not present

## 2024-10-09 DIAGNOSIS — M9905 Segmental and somatic dysfunction of pelvic region: Secondary | ICD-10-CM | POA: Diagnosis not present

## 2024-10-09 DIAGNOSIS — M4125 Other idiopathic scoliosis, thoracolumbar region: Secondary | ICD-10-CM | POA: Diagnosis not present

## 2024-10-09 DIAGNOSIS — M9904 Segmental and somatic dysfunction of sacral region: Secondary | ICD-10-CM | POA: Diagnosis not present

## 2024-10-09 DIAGNOSIS — M9902 Segmental and somatic dysfunction of thoracic region: Secondary | ICD-10-CM | POA: Diagnosis not present

## 2024-10-09 DIAGNOSIS — M47812 Spondylosis without myelopathy or radiculopathy, cervical region: Secondary | ICD-10-CM | POA: Diagnosis not present

## 2024-10-13 DIAGNOSIS — M9902 Segmental and somatic dysfunction of thoracic region: Secondary | ICD-10-CM | POA: Diagnosis not present

## 2024-10-13 DIAGNOSIS — M9905 Segmental and somatic dysfunction of pelvic region: Secondary | ICD-10-CM | POA: Diagnosis not present

## 2024-10-13 DIAGNOSIS — M9901 Segmental and somatic dysfunction of cervical region: Secondary | ICD-10-CM | POA: Diagnosis not present

## 2024-10-13 DIAGNOSIS — M9904 Segmental and somatic dysfunction of sacral region: Secondary | ICD-10-CM | POA: Diagnosis not present

## 2024-10-13 DIAGNOSIS — M4726 Other spondylosis with radiculopathy, lumbar region: Secondary | ICD-10-CM | POA: Diagnosis not present

## 2024-10-13 DIAGNOSIS — M9903 Segmental and somatic dysfunction of lumbar region: Secondary | ICD-10-CM | POA: Diagnosis not present

## 2024-10-13 DIAGNOSIS — M797 Fibromyalgia: Secondary | ICD-10-CM | POA: Diagnosis not present

## 2024-10-13 DIAGNOSIS — M4125 Other idiopathic scoliosis, thoracolumbar region: Secondary | ICD-10-CM | POA: Diagnosis not present

## 2024-10-13 DIAGNOSIS — M47812 Spondylosis without myelopathy or radiculopathy, cervical region: Secondary | ICD-10-CM | POA: Diagnosis not present

## 2024-10-20 ENCOUNTER — Telehealth: Admitting: Adult Health

## 2024-10-20 DIAGNOSIS — M9902 Segmental and somatic dysfunction of thoracic region: Secondary | ICD-10-CM | POA: Diagnosis not present

## 2024-10-20 DIAGNOSIS — M4125 Other idiopathic scoliosis, thoracolumbar region: Secondary | ICD-10-CM | POA: Diagnosis not present

## 2024-10-20 DIAGNOSIS — M9903 Segmental and somatic dysfunction of lumbar region: Secondary | ICD-10-CM | POA: Diagnosis not present

## 2024-10-20 DIAGNOSIS — M47812 Spondylosis without myelopathy or radiculopathy, cervical region: Secondary | ICD-10-CM | POA: Diagnosis not present

## 2024-10-20 DIAGNOSIS — M9904 Segmental and somatic dysfunction of sacral region: Secondary | ICD-10-CM | POA: Diagnosis not present

## 2024-10-20 DIAGNOSIS — M4726 Other spondylosis with radiculopathy, lumbar region: Secondary | ICD-10-CM | POA: Diagnosis not present

## 2024-10-20 DIAGNOSIS — M9905 Segmental and somatic dysfunction of pelvic region: Secondary | ICD-10-CM | POA: Diagnosis not present

## 2024-10-20 DIAGNOSIS — M797 Fibromyalgia: Secondary | ICD-10-CM | POA: Diagnosis not present

## 2024-10-20 DIAGNOSIS — M9901 Segmental and somatic dysfunction of cervical region: Secondary | ICD-10-CM | POA: Diagnosis not present

## 2024-10-21 ENCOUNTER — Encounter: Payer: Self-pay | Admitting: Adult Health

## 2024-10-21 ENCOUNTER — Telehealth: Admitting: Adult Health

## 2024-10-21 DIAGNOSIS — G4700 Insomnia, unspecified: Secondary | ICD-10-CM | POA: Diagnosis not present

## 2024-10-21 DIAGNOSIS — F411 Generalized anxiety disorder: Secondary | ICD-10-CM | POA: Diagnosis not present

## 2024-10-21 DIAGNOSIS — F331 Major depressive disorder, recurrent, moderate: Secondary | ICD-10-CM

## 2024-10-21 MED ORDER — BUPROPION HCL ER (XL) 300 MG PO TB24
300.0000 mg | ORAL_TABLET | Freq: Every day | ORAL | 1 refills | Status: AC
Start: 2024-10-21 — End: ?

## 2024-10-21 MED ORDER — TRAZODONE HCL 50 MG PO TABS: 75.0000 mg | ORAL_TABLET | Freq: Every day | ORAL | 1 refills | Status: AC

## 2024-10-21 NOTE — Progress Notes (Signed)
 Kimberly Stone [id number] [birth year] 78 y.o.  Virtual Visit via Video Note  I connected with pt @ on 10/21/24 at 10:00 AM EST by a video enabled telemedicine application and verified that I am speaking with the correct person using two identifiers.   I discussed the limitations of evaluation and management by telemedicine and the availability of in person appointments. The patient expressed understanding and agreed to proceed.  I discussed the assessment and treatment plan with the patient. The patient was provided an opportunity to ask questions and all were answered. The patient agreed with the plan and demonstrated an understanding of the instructions.   The patient was advised to call back or seek an in-person evaluation if the symptoms worsen or if the condition fails to improve as anticipated.  I provided 20 minutes of non-face-to-face time during this encounter.  The patient was located at home.  The provider was located at [facility].   Angeline LOISE Sayers, NP   Subjective:   Patient ID:  IVELISE Stone is a 78 y.o. (DOB [birth year]) female.  Chief Complaint: No chief complaint on file.   HPI [name] presents for follow-up of MDD, GAD and insomnia.  Describes mood today as ok. Pleasant. Denies tearfulness. Mood symptoms - reports some depression, anxiety and irritability. Reports lower interest and motivation. Denies panic attacks. Denies worry and rumination. Reports some over thinking. Denies obsessive thoughts and acts. Has recovered from cardiac and GI procedures. Reports mood as lower. Stating I feel like I'm just down - I can't put my finger on it. Feels like current medications are helpful. Taking medications as prescribed.  Energy levels lower - gets tired easily. Active, does not have a regular exercise routine. Enjoys some usual interests and activities. Married. Lives with husband. Has 2 sons - 32 and 80 - lost her daughter at age 50. Has 5  grandchildren. Spending time with family.  Appetite adequate. Weight loss - 102 pounds. Working with [place] - appointment upcoming. Reports sleeping better some nights than others. Averages 7 to 8 hours.  Reports focus and concentration difficulties. Completing tasks. Managing aspects of household. Retired.  Denies SI or HI.  Denies AH or VH. Denies self harm. Denies substance use.   Review of Systems:  Review of Systems  Musculoskeletal:  Negative for gait problem.  Neurological:  Negative for tremors.  Psychiatric/Behavioral:         Please refer to HPI    Medications: I have reviewed the patient's current medications.  Current Outpatient Medications  Medication Sig Dispense Refill   acetaminophen  (TYLENOL ) 650 MG CR tablet Take 1,300 mg by mouth 2 (two) times daily as needed for pain.     amLODipine  (NORVASC ) 5 MG tablet Take 5 mg by mouth in the morning.     amoxicillin (AMOXIL) 500 MG tablet Take 4 tablets (2,000 mg total) by mouth as directed. 1 hour prior to dental work including cleanings 12 tablet 12   Ascorbic Acid (VITAMIN C PO) Take 1 tablet by mouth in the morning.     buPROPion  (WELLBUTRIN  XL) 300 MG 24 hr tablet Take 1 tablet (300 mg total) by mouth daily. 90 tablet 1   Cholecalciferol (VITAMIN D3 ULTRA STRENGTH PO) Take 5,000 Units by mouth in the morning.     clopidogrel  (PLAVIX ) 75 MG tablet TAKE 1 TABLET (75 MG TOTAL) BY MOUTH DAILY. PLEASE SCHEDULE APPOINTMENT FOR REFILLS. 7 tablet 0   diphenoxylate-atropine (LOMOTIL) 2.5-0.025 MG tablet Take 1 tablet  by mouth 4 (four) times daily as needed.     DULoxetine  (CYMBALTA ) 60 MG capsule Take 1 capsule (60 mg total) by mouth 2 (two) times daily. (Patient taking differently: Take 60 mg by mouth in the morning.) 180 capsule 1   Evolocumab (REPATHA) 140 MG/ML SOSY Inject 140 mg into the skin every 14 (fourteen) days.     furosemide  (LASIX ) 20 MG tablet Take 1 tablet (20 mg total) by mouth daily. Be sure to eat potassium rich  foods while taking Furosemide . 90 tablet 3   lipase/protease/amylase (CREON) 36000 UNITS CPEP capsule Take 36,000-72,000 Units by mouth See admin instructions. Take 2 capsules with each meal and take 1 capsule with each snack     meclizine (ANTIVERT) 25 MG tablet Take 25 mg by mouth 3 (three) times daily as needed.     Menthol-Methyl Salicylate (SALONPAS PAIN RELIEF PATCH) PTCH Place 1 patch onto the skin daily as needed (pain.).     methocarbamol  (ROBAXIN ) 500 MG tablet Take 500 mg by mouth every 8 (eight) hours as needed for muscle spasms.     Multiple Vitamin (MULTIVITAMIN WITH MINERALS) TABS Take 1 tablet by mouth in the morning.     pantoprazole  (PROTONIX ) 40 MG tablet Take 40 mg by mouth 2 (two) times daily.     promethazine  (PHENERGAN ) 25 MG tablet Take 25 mg by mouth every 8 (eight) hours as needed for nausea or vomiting.     timolol (TIMOPTIC) 0.5 % ophthalmic solution Place 1 drop into both eyes in the morning.     Travoprost, BAK Free, (TRAVATAN) 0.004 % SOLN ophthalmic solution Place 1 drop into both eyes at bedtime.     traZODone  (DESYREL ) 50 MG tablet Take 1.5 tablets (75 mg total) by mouth at bedtime. (Patient taking differently: Take 50 mg by mouth at bedtime and may repeat dose one time if needed.) 135 tablet 1   Turmeric (QC TUMERIC COMPLEX PO) Take 1 capsule by mouth in the morning.     ursodiol (ACTIGALL) 300 MG capsule Take 300 mg by mouth in the morning.     No current facility-administered medications for this visit.    Medication Side Effects: None  Allergies:  Allergies  Allergen Reactions   Aspirin  Other (See Comments)    PT CANNOT TAKE DUE TO HX OF GASTRIC BYPASS SURGERY    Loperamide Palpitations and Other (See Comments)   Statins     Joint pain   Atorvastatin      elevated LFTs   Beta Adrenergic Blockers     dyspnea   Celecoxib     UGI bleed   Desvenlafaxine     racing, twitching, everything going too fast.   Ezetimibe      liver panel went up   Nsaids      Other Reaction(s): prior GI bleed   Rosuvastatin     Other Reaction(s): elevated LFTs   Simvastatin     Other Reaction(s): elevated LF's, elevated LFTs, muscle problems   Erythromycin Other (See Comments)    UNKNOWN    Past Medical History:  Diagnosis Date   Acute MI (HCC) 11/2004   NONSTEMI 2005 NOINTERVENTION PER NOTE    Anemia    from bypass- iron and B12   Anxiety    Aortic stenosis    Arthritis    Coronary artery disease    DDD (degenerative disc disease), cervical    Depression    DJD (degenerative joint disease)    Fibromyalgia    GERD (  gastroesophageal reflux disease)    GIB (gastrointestinal bleeding)    while on celecoxib post bypass   Hyperlipidemia    Hyperlipidemia LDL goal < 130 01/23/2014   Hypertension    Obesity    hx of bipass, now resolved.  borderline diabetes also resolved.   Osteopenia    Post-menopausal    S/P TAVR (transcatheter aortic valve replacement) 09/23/2024   s/p TAVR with a 23 mm Edwards S3UR via TF approach by Dr. Verlin and Dr. Daniel   SCCA (squamous cell carcinoma) of skin 09/21/2015   left forearm medial cx3,37fu   SCCA (squamous cell carcinoma) of skin 07/26/2016   bridge of nose cx3 18fu   SCCA (squamous cell carcinoma) of skin 02/10/2020   right inner cheek tx after biopsy   SCCA (squamous cell carcinoma) of skin 02/10/2020   left hand tx after biopsy   Scoliosis    Sinusitis    Statin intolerance 01/23/2014    Family History  Problem Relation Age of Onset   Heart failure Mother    Liver cancer Father    Cancer Brother    Heart failure Maternal Grandmother    Aneurysm Maternal Grandmother        ruptured abdominal aortic aneurysm    Social History   Socioeconomic History   Marital status: Married    Spouse name: Eric   Number of children: 3   Years of education: LPN   Highest education level: Not on file  Occupational History   Occupation: Retired  Tobacco Use   Smoking status: Former    Current  packs/day: 0.00    Average packs/day: 0.3 packs/day for 5.0 years (1.3 ttl pk-yrs)    Types: Cigarettes    Start date: 73    Quit date: 12/18/1996    Years since quitting: 27.8   Smokeless tobacco: Never  Vaping Use   Vaping status: Never Used  Substance and Sexual Activity   Alcohol  use: Yes    Alcohol /week: 1.0 - 2.0 standard drink of alcohol     Types: 1 - 2 Glasses of wine per week    Comment: rare wine   Drug use: No   Sexual activity: Not Currently    Birth control/protection: Surgical    Comment: hysterectomy-1973  Other Topics Concern   Not on file  Social History Narrative   Pt lives at home with husband.  Drives and does not use assist device.  Caffeine Use: 2 cups daily   Social Drivers of Health   Financial Resource Strain: Low Risk  (06/12/2023)   Overall Financial Resource Strain (CARDIA)    Difficulty of Paying Living Expenses: Not hard at all  Food Insecurity: No Food Insecurity (09/23/2024)   Hunger Vital Sign    Worried About Running Out of Food in the Last Year: Never true    Ran Out of Food in the Last Year: Never true  Transportation Needs: No Transportation Needs (09/23/2024)   PRAPARE - Administrator, Civil Service (Medical): No    Lack of Transportation (Non-Medical): No  Physical Activity: Inactive (06/12/2023)   Exercise Vital Sign    Days of Exercise per Week: 0 days    Minutes of Exercise per Session: 0 min  Stress: Stress Concern Present (06/12/2023)   [employer] of Occupational Health - Occupational Stress Questionnaire    Feeling of Stress : Very much  Social Connections: Socially Integrated (09/23/2024)   Social Connection and Isolation Panel    Frequency of Communication with  Friends and Family: More than three times a week    Frequency of Social Gatherings with Friends and Family: More than three times a week    Attends Religious Services: More than 4 times per year    Active Member of [employer] or Organizations: Yes     Attends Banker Meetings: Never    Marital Status: Married  Catering Manager Violence: Not At Risk (09/23/2024)   Humiliation, Afraid, Rape, and Kick questionnaire    Fear of Current or Ex-Partner: No    Emotionally Abused: No    Physically Abused: No    Sexually Abused: No    Past Medical History, Surgical history, Social history, and Family history were reviewed and updated as appropriate.   Please see review of systems for further details on the patient's review from today.   Objective:   Physical Exam:  LMP 03/18/1972 (Approximate)   Physical Exam Constitutional:      General: She is not in acute distress. Musculoskeletal:        General: No deformity.  Neurological:     Mental Status: She is alert and oriented to person, place, and time.     Coordination: Coordination normal.  Psychiatric:        Attention and Perception: Attention and perception normal. She does not perceive auditory or visual hallucinations.        Mood and Affect: Mood normal. Mood is not anxious or depressed. Affect is not labile, blunt, angry or inappropriate.        Speech: Speech normal.        Behavior: Behavior normal.        Thought Content: Thought content normal. Thought content is not paranoid or delusional. Thought content does not include homicidal or suicidal ideation. Thought content does not include homicidal or suicidal plan.        Cognition and Memory: Cognition and memory normal.        Judgment: Judgment normal.     Comments: Insight intact     Lab Review:     Component Value Date/Time   NA 135 09/24/2024 0339   NA 141 08/05/2024 1642   K 4.0 09/24/2024 0339   CL 102 09/24/2024 0339   CO2 22 09/24/2024 0339   GLUCOSE 111 (H) 09/24/2024 0339   BUN 17 09/24/2024 0339   BUN 22 08/05/2024 1642   CREATININE 0.97 09/24/2024 0339   CALCIUM  8.6 (L) 09/24/2024 0339   PROT 6.7 09/19/2024 0843   ALBUMIN 3.5 09/19/2024 0843   AST 19 09/19/2024 0843   ALT 16 09/19/2024  0843   ALKPHOS 132 (H) 09/19/2024 0843   BILITOT 0.8 09/19/2024 0843   GFRNONAA 60 (L) 09/24/2024 0339   GFRAA >60 06/13/2015 0254       Component Value Date/Time   WBC 8.5 09/24/2024 0339   RBC 3.58 (L) 09/24/2024 0339   HGB 11.3 (L) 09/24/2024 0339   HGB 12.3 08/08/2024 1448   HCT 33.5 (L) 09/24/2024 0339   HCT 37.5 08/08/2024 1448   PLT 271 09/24/2024 0339   PLT 282 08/08/2024 1448   MCV 93.6 09/24/2024 0339   MCV 96 08/08/2024 1448   MCH 31.6 09/24/2024 0339   MCHC 33.7 09/24/2024 0339   RDW 12.6 09/24/2024 0339   RDW 13.0 08/08/2024 1448   LYMPHSABS 1.4 06/11/2015 0451   MONOABS 0.8 06/11/2015 0451   EOSABS 0.0 06/11/2015 0451   BASOSABS 0.0 06/11/2015 0451    No results found for: POCLITH, LITHIUM   No results found for: PHENYTOIN, PHENOBARB, VALPROATE, CBMZ   .res Assessment: Plan:    Plan:  PDMP reviewed  Wellbutrin  XL 300mg  denies seizure history Cymbalta  60mg  twice daily Trazdone 100mg  at hs.  Completed adult ADD screening tool. Screening tool indicating possible ADD symptoms - referred to [facility] for further evaluation.  RTC 3 months  20 minutes spent dedicated to the care of this patient on the date of this encounter to include pre-visit review of records, ordering of medication, post visit documentation, and face-to-face time with the patient discussing depression, anxiety and insomnia. Discussed continuing current medication regimen.  Patient advised to contact office with any questions, adverse effects, or acute worsening in signs and symptoms.   There are no diagnoses linked to this encounter.   Please see After Visit Summary for patient specific instructions.  Future Appointments  Date Time Provider Department Center  11/20/2024 12:30 PM [facility]JECTION ROOM [facility] None  11/20/2024  1:00 PM [facility]-ECHO 4 [facility]-ECHO H&V  11/20/2024  2:20 PM Sebastian Lamarr SAUNDERS, PA-C [facility] H&V    No orders of the defined types were placed in this  encounter.     -------------------------------

## 2024-11-04 DIAGNOSIS — L0889 Other specified local infections of the skin and subcutaneous tissue: Secondary | ICD-10-CM | POA: Diagnosis not present

## 2024-11-04 DIAGNOSIS — B9689 Other specified bacterial agents as the cause of diseases classified elsewhere: Secondary | ICD-10-CM | POA: Diagnosis not present

## 2024-11-04 DIAGNOSIS — L570 Actinic keratosis: Secondary | ICD-10-CM | POA: Diagnosis not present

## 2024-11-04 DIAGNOSIS — D485 Neoplasm of uncertain behavior of skin: Secondary | ICD-10-CM | POA: Diagnosis not present

## 2024-11-04 DIAGNOSIS — L02415 Cutaneous abscess of right lower limb: Secondary | ICD-10-CM | POA: Diagnosis not present

## 2024-11-10 DIAGNOSIS — M9903 Segmental and somatic dysfunction of lumbar region: Secondary | ICD-10-CM | POA: Diagnosis not present

## 2024-11-10 DIAGNOSIS — M4726 Other spondylosis with radiculopathy, lumbar region: Secondary | ICD-10-CM | POA: Diagnosis not present

## 2024-11-10 DIAGNOSIS — M47812 Spondylosis without myelopathy or radiculopathy, cervical region: Secondary | ICD-10-CM | POA: Diagnosis not present

## 2024-11-10 DIAGNOSIS — M9904 Segmental and somatic dysfunction of sacral region: Secondary | ICD-10-CM | POA: Diagnosis not present

## 2024-11-10 DIAGNOSIS — M9905 Segmental and somatic dysfunction of pelvic region: Secondary | ICD-10-CM | POA: Diagnosis not present

## 2024-11-10 DIAGNOSIS — M797 Fibromyalgia: Secondary | ICD-10-CM | POA: Diagnosis not present

## 2024-11-10 DIAGNOSIS — M4125 Other idiopathic scoliosis, thoracolumbar region: Secondary | ICD-10-CM | POA: Diagnosis not present

## 2024-11-10 DIAGNOSIS — M9902 Segmental and somatic dysfunction of thoracic region: Secondary | ICD-10-CM | POA: Diagnosis not present

## 2024-11-10 DIAGNOSIS — M9901 Segmental and somatic dysfunction of cervical region: Secondary | ICD-10-CM | POA: Diagnosis not present

## 2024-11-20 ENCOUNTER — Inpatient Hospital Stay (HOSPITAL_COMMUNITY): Admission: RE | Admit: 2024-11-20 | Source: Ambulatory Visit

## 2024-11-20 ENCOUNTER — Ambulatory Visit: Attending: Physician Assistant | Admitting: Physician Assistant

## 2024-11-20 ENCOUNTER — Ambulatory Visit: Payer: Self-pay | Admitting: Cardiovascular Disease

## 2024-11-20 ENCOUNTER — Ambulatory Visit (HOSPITAL_COMMUNITY): Admission: RE | Admit: 2024-11-20 | Discharge: 2024-11-20 | Disposition: A | Attending: Internal Medicine

## 2024-11-20 VITALS — BP 126/70 | HR 60 | Ht 62.0 in | Wt 112.0 lb

## 2024-11-20 DIAGNOSIS — I350 Nonrheumatic aortic (valve) stenosis: Secondary | ICD-10-CM | POA: Insufficient documentation

## 2024-11-20 DIAGNOSIS — I2510 Atherosclerotic heart disease of native coronary artery without angina pectoris: Secondary | ICD-10-CM | POA: Insufficient documentation

## 2024-11-20 DIAGNOSIS — E876 Hypokalemia: Secondary | ICD-10-CM | POA: Insufficient documentation

## 2024-11-20 DIAGNOSIS — Z952 Presence of prosthetic heart valve: Secondary | ICD-10-CM | POA: Diagnosis present

## 2024-11-20 DIAGNOSIS — I10 Essential (primary) hypertension: Secondary | ICD-10-CM | POA: Insufficient documentation

## 2024-11-20 LAB — ECHOCARDIOGRAM COMPLETE
AR max vel: 1.3 cm2
AV Area VTI: 1.21 cm2
AV Area mean vel: 1.33 cm2
AV Mean grad: 7 mmHg
AV Peak grad: 11.6 mmHg
Ao pk vel: 1.7 m/s
Area-P 1/2: 4.71 cm2
S' Lateral: 3.4 cm

## 2024-11-20 NOTE — Patient Instructions (Signed)
 Medication Instructions:  Your physician recommends that you continue on your current medications as directed. Please refer to the Current Medication list given to you today.  *If you need a refill on your cardiac medications before your next appointment, please call your pharmacy*  Lab Work: None needed If you have labs (blood work) drawn today and your tests are completely normal, you will receive your results only by: MyChart Message (if you have MyChart) OR A paper copy in the mail If you have any lab test that is abnormal or we need to change your treatment, we will call you to review the results.  Testing/Procedures: None needed  Follow-Up: At [facility], you and your health needs are our priority.  As part of our continuing mission to provide you with exceptional heart care, our providers are all part of one team.  This team includes your primary Cardiologist (physician) and Advanced Practice Providers or APPs (Physician Assistants and Nurse Practitioners) who all work together to provide you with the care you need, when you need it.  Your next appointment:   6 month(s)  Provider:   Jerel Balding, MD    We recommend signing up for the patient portal called MyChart.  Sign up information is provided on this After Visit Summary.  MyChart is used to connect with patients for Virtual Visits (Telemedicine).  Patients are able to view lab/test results, encounter notes, upcoming appointments, etc.  Non-urgent messages can be sent to your provider as well.   To learn more about what you can do with MyChart, go to [email or web address].

## 2024-11-20 NOTE — Progress Notes (Signed)
 [facility]   MULTIDISCIPLINARY HEART VALVE CLINIC                                     Cardiology Office Note:    Date:  11/20/2024   ID:  Kimberly Stone, DOB February 22, 1946, MRN [id number]  PCP:  Shayne Anes, MD  [facility] Cardiologist:  Jerel Balding, MD  [facility] Structural heart: Lonni Cash, MD [facility] Electrophysiologist:  None   Referring MD: Shayne Anes, MD   1 month s/p TAVR   History of Present Illness:    Kimberly Stone is a 78 y.o. female with a hx of coronary disease (presented with non-STEMI in 2005 related to severe diffuse disease in ramus intermedius, not amenable to PCI), LBBB, HTN, HLD, GERD, resolution of obesity and prediabetes after gastric bypass surgery and pLFLG severe AS s/p TAVR (09/23/24) who presents to clinic for follow up.    Echo May 2025 with EF 55-60%, G2DD, mild MR and moderate to severe aortic valve stenosis (mean gradient 22 mmHg, AVA 0.85 cm2, DI 0.24, SVI 37) as well as moderate aortic valve insufficiency. Cardiac cath 06/27/24 with no evidence of CAD and normal right heart pressures. She reported progressive fatigue, dyspnea with exertion and dizziness. S/p TAVR with a 23 mm Edwards Sapien 3 Ultra Resilia THV via the TF approach on 09/23/24. Post op echo showed EF 50%, mod LVH, small pericardial effusion, normally functioning TAVR with a mean gradient of 7 mmHg and no PVL.  Today the patient presents to clinic for follow up. Here with her husband. She is feeling great. Just got back from the beach. Has much more energy. No longer has to take a nap everyday. No CP or SOB. No LE edema, orthopnea or PND. No dizziness or syncope. No blood in stool or urine. No palpitations.     Past Medical History:  Diagnosis Date   Acute MI (HCC) 11/2004   NONSTEMI 2005 NOINTERVENTION PER NOTE    Anemia    from bypass- iron and B12   Anxiety    Aortic stenosis    Arthritis    Coronary artery disease    DDD (degenerative  disc disease), cervical    Depression    DJD (degenerative joint disease)    Fibromyalgia    GERD (gastroesophageal reflux disease)    GIB (gastrointestinal bleeding)    while on celecoxib post bypass   Hyperlipidemia    Hyperlipidemia LDL goal < 130 01/23/2014   Hypertension    Obesity    hx of bipass, now resolved.  borderline diabetes also resolved.   Osteopenia    Post-menopausal    S/P TAVR (transcatheter aortic valve replacement) 09/23/2024   s/p TAVR with a 23 mm Edwards S3UR via TF approach by Dr. Cash and Dr. Daniel   SCCA (squamous cell carcinoma) of skin 09/21/2015   left forearm medial cx3,72fu   SCCA (squamous cell carcinoma) of skin 07/26/2016   bridge of nose cx3 4fu   SCCA (squamous cell carcinoma) of skin 02/10/2020   right inner cheek tx after biopsy   SCCA (squamous cell carcinoma) of skin 02/10/2020   left hand tx after biopsy   Scoliosis    Sinusitis    Statin intolerance 01/23/2014     Current Medications: Current Meds  Medication Sig   acetaminophen  (TYLENOL ) 650 MG CR tablet Take 1,300 mg by mouth 2 (  two) times daily as needed for pain.   amLODipine  (NORVASC ) 5 MG tablet Take 5 mg by mouth in the morning.   amoxicillin  (AMOXIL ) 500 MG tablet Take 4 tablets (2,000 mg total) by mouth as directed. 1 hour prior to dental work including cleanings   Ascorbic Acid (VITAMIN C PO) Take 1 tablet by mouth in the morning.   buPROPion  (WELLBUTRIN  XL) 300 MG 24 hr tablet Take 1 tablet (300 mg total) by mouth daily.   Cholecalciferol (VITAMIN D3 ULTRA STRENGTH PO) Take 5,000 Units by mouth in the morning.   clopidogrel  (PLAVIX ) 75 MG tablet TAKE 1 TABLET (75 MG TOTAL) BY MOUTH DAILY. PLEASE SCHEDULE APPOINTMENT FOR REFILLS.   diphenoxylate-atropine (LOMOTIL) 2.5-0.025 MG tablet Take 1 tablet by mouth 4 (four) times daily as needed.   DULoxetine  (CYMBALTA ) 60 MG capsule Take 1 capsule (60 mg total) by mouth 2 (two) times daily. (Patient taking differently: Take 60 mg  by mouth in the morning.)   Evolocumab (REPATHA) 140 MG/ML SOSY Inject 140 mg into the skin every 14 (fourteen) days.   lipase/protease/amylase (CREON ) 36000 UNITS CPEP capsule Take 36,000-72,000 Units by mouth See admin instructions. Take 2 capsules with each meal and take 1 capsule with each snack   Menthol-Methyl Salicylate (SALONPAS PAIN RELIEF PATCH) PTCH Place 1 patch onto the skin daily as needed (pain.).   methocarbamol  (ROBAXIN ) 500 MG tablet Take 500 mg by mouth every 8 (eight) hours as needed for muscle spasms.   Multiple Vitamin (MULTIVITAMIN WITH MINERALS) TABS Take 1 tablet by mouth in the morning.   pantoprazole  (PROTONIX ) 40 MG tablet Take 40 mg by mouth 2 (two) times daily.   promethazine  (PHENERGAN ) 25 MG tablet Take 25 mg by mouth every 8 (eight) hours as needed for nausea or vomiting.   timolol (TIMOPTIC) 0.5 % ophthalmic solution Place 1 drop into both eyes in the morning.   Travoprost, BAK Free, (TRAVATAN) 0.004 % SOLN ophthalmic solution Place 1 drop into both eyes at bedtime.   traZODone  (DESYREL ) 50 MG tablet Take 1.5 tablets (75 mg total) by mouth at bedtime.   Turmeric (QC TUMERIC COMPLEX PO) Take 1 capsule by mouth in the morning.   ursodiol (ACTIGALL) 300 MG capsule Take 300 mg by mouth in the morning.      ROS:   Please see the history of present illness.    All other systems reviewed and are negative.  EKGs       Risk Assessment/Calculations:           Physical Exam:    VS:  BP 126/70   Pulse 60   Ht 5' 2 (1.575 m)   Wt 112 lb (50.8 kg)   LMP 03/18/1972 (Approximate)   BMI 20.49 kg/m     Wt Readings from Last 3 Encounters:  11/20/24 112 lb (50.8 kg)  09/29/24 109 lb 9.6 oz (49.7 kg)  09/24/24 107 lb 1.6 oz (48.6 kg)     GEN: Well nourished, well developed in no acute distress NECK: No JVD CARDIAC: RRR, soft flow murmur @ RUSB. No rubs, gallops RESPIRATORY:  Clear to auscultation without rales, wheezing or rhonchi  ABDOMEN: Soft,  non-tender, non-distended EXTREMITIES:  No edema; No deformity.   ASSESSMENT:    1. S/P TAVR (transcatheter aortic valve replacement)   2. Coronary artery disease involving native coronary artery of native heart without angina pectoris   3. Essential hypertension   4. Hypokalemia      PLAN:    In  order of problems listed above:  Severe AS s/p TAVR:  -- Echo today shows EF 50%, paradoxical septal motion 2/2 LBBB, mild MR, normally functioning TAVR with a mean gradient of 7 mm hg and no PVL.  -- NYHA class I symptoms -- SBE prophylaxis discussed; I have RX'd amoxicillin .  -- Continue chronic clopidogrel  75mg  daily.  -- I will see back for 1 year echo and OV.  CAD: -- NSTEMI in 2005 related to severe diffuse disease in ramus intermedius, not amenable to PCI. -- Cardiac cath 06/27/24 with no evidence of CAD. -- Continue clopidogrel  75mg  daily.  -- Statin intolerant. Continue Repatha    HTN: -- BP well controlled. -- Continue amlodipine  5mg  daily.    Medication Adjustments/Labs and Tests Ordered: Current medicines are reviewed at length with the patient today.  Concerns regarding medicines are outlined above.  Orders Placed This Encounter  Procedures   ECHOCARDIOGRAM COMPLETE   No orders of the defined types were placed in this encounter.   Patient Instructions  Medication Instructions:  Your physician recommends that you continue on your current medications as directed. Please refer to the Current Medication list given to you today.  *If you need a refill on your cardiac medications before your next appointment, please call your pharmacy*  Lab Work: None needed If you have labs (blood work) drawn today and your tests are completely normal, you will receive your results only by: MyChart Message (if you have MyChart) OR A paper copy in the mail If you have any lab test that is abnormal or we need to change your treatment, we will call you to review the  results.  Testing/Procedures: None needed  Follow-Up: At [facility], you and your health needs are our priority.  As part of our continuing mission to provide you with exceptional heart care, our providers are all part of one team.  This team includes your primary Cardiologist (physician) and Advanced Practice Providers or APPs (Physician Assistants and Nurse Practitioners) who all work together to provide you with the care you need, when you need it.  Your next appointment:   6 month(s)  Provider:   Jerel Balding, MD    We recommend signing up for the patient portal called MyChart.  Sign up information is provided on this After Visit Summary.  MyChart is used to connect with patients for Virtual Visits (Telemedicine).  Patients are able to view lab/test results, encounter notes, upcoming appointments, etc.  Non-urgent messages can be sent to your provider as well.   To learn more about what you can do with MyChart, go to [email or web address].            Signed, Lamarr Hummer, PA-C  11/20/2024 7:21 PM    [facility]

## 2024-12-02 DIAGNOSIS — H53453 Other localized visual field defect, bilateral: Secondary | ICD-10-CM | POA: Diagnosis not present

## 2024-12-02 DIAGNOSIS — H401232 Low-tension glaucoma, bilateral, moderate stage: Secondary | ICD-10-CM | POA: Diagnosis not present

## 2025-01-15 ENCOUNTER — Telehealth (HOSPITAL_COMMUNITY): Payer: Self-pay | Admitting: Pharmacy Technician

## 2025-01-15 ENCOUNTER — Other Ambulatory Visit (HOSPITAL_COMMUNITY): Payer: Self-pay | Admitting: Internal Medicine

## 2025-01-15 NOTE — Telephone Encounter (Signed)
 Auth Submission: NO AUTH NEEDED Site of care: [facility] Payer: MEDICARE A/B, [employer] SUPP Medication & CPT/J Code(s) submitted: Stoboclo (denosumab-bmwo) [id number] Diagnosis Code: M81.0 Route of submission (phone, fax, portal):  Phone # Fax # Auth type: Buy/Bill HB Units/visits requested: 60mg  x 2 doses, q 6 months Reference number:  Approval from: 01/15/2025 to 12/17/25    Dagoberto Armour, CPhT [facility] Phone: [phone] 01/15/2025

## 2025-01-20 ENCOUNTER — Ambulatory Visit (HOSPITAL_COMMUNITY)
Admission: RE | Admit: 2025-01-20 | Discharge: 2025-01-20 | Disposition: A | Source: Ambulatory Visit | Attending: Internal Medicine

## 2025-01-20 VITALS — BP 136/72 | HR 63 | Temp 97.1°F | Resp 15

## 2025-01-20 DIAGNOSIS — M810 Age-related osteoporosis without current pathological fracture: Secondary | ICD-10-CM

## 2025-01-20 MED ORDER — DENOSUMAB-BMWO 60 MG/ML ~~LOC~~ SOSY
PREFILLED_SYRINGE | SUBCUTANEOUS | Status: AC
  Filled 2025-01-20: qty 1

## 2025-01-20 MED ORDER — DENOSUMAB-BMWO 60 MG/ML ~~LOC~~ SOSY
60.0000 mg | PREFILLED_SYRINGE | Freq: Once | SUBCUTANEOUS | Status: AC
  Administered 2025-01-20: 60 mg via SUBCUTANEOUS

## 2025-01-21 ENCOUNTER — Inpatient Hospital Stay (HOSPITAL_COMMUNITY): Admission: RE | Admit: 2025-01-21 | Source: Ambulatory Visit

## 2025-01-23 ENCOUNTER — Encounter (HOSPITAL_COMMUNITY): Payer: Self-pay | Admitting: Internal Medicine

## 2025-01-23 NOTE — Addendum Note (Signed)
 Addended by: DAYNE SHERRY RAMAN on: 01/23/2025 03:03 PM   Modules accepted: Orders

## 2025-02-03 ENCOUNTER — Ambulatory Visit: Admitting: Adult Health

## 2025-05-22 ENCOUNTER — Inpatient Hospital Stay (HOSPITAL_COMMUNITY): Admission: RE | Admit: 2025-05-22 | Source: Ambulatory Visit

## 2025-07-22 ENCOUNTER — Encounter (HOSPITAL_COMMUNITY)

## 2025-09-24 ENCOUNTER — Ambulatory Visit: Admitting: Physician Assistant

## 2025-09-24 ENCOUNTER — Ambulatory Visit (HOSPITAL_COMMUNITY)
